# Patient Record
Sex: Female | Born: 1995 | Race: White | Hispanic: No | Marital: Married | Smoking: Former smoker
Health system: Southern US, Community
[De-identification: ages and names within clinical notes are randomized; demographics above are authoritative.]

## PROBLEM LIST (undated history)

## (undated) DIAGNOSIS — G935 Compression of brain: Secondary | ICD-10-CM

## (undated) DIAGNOSIS — E063 Autoimmune thyroiditis: Secondary | ICD-10-CM

## (undated) DIAGNOSIS — E039 Hypothyroidism, unspecified: Secondary | ICD-10-CM

## (undated) DIAGNOSIS — R519 Headache, unspecified: Secondary | ICD-10-CM

## (undated) DIAGNOSIS — D6800 Von Willebrand disease, unspecified: Secondary | ICD-10-CM

## (undated) HISTORY — DX: Headache, unspecified: R51.9

## (undated) HISTORY — PX: WISDOM TOOTH EXTRACTION: SHX21

## (undated) HISTORY — DX: Compression of brain: G93.5

---
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
## 2014-01-06 ENCOUNTER — Emergency Department: Payer: Self-pay | Admitting: Emergency Medicine

## 2014-01-06 LAB — COMPREHENSIVE METABOLIC PANEL
ALBUMIN: 4.4 g/dL (ref 3.8–5.6)
Alkaline Phosphatase: 70 U/L
Anion Gap: 5 — ABNORMAL LOW (ref 7–16)
BUN: 14 mg/dL (ref 7–18)
Bilirubin,Total: 0.8 mg/dL (ref 0.2–1.0)
CALCIUM: 9.8 mg/dL (ref 9.0–10.7)
CHLORIDE: 102 mmol/L (ref 98–107)
Co2: 28 mmol/L (ref 21–32)
Creatinine: 0.88 mg/dL (ref 0.60–1.30)
GLUCOSE: 126 mg/dL — AB (ref 65–99)
Osmolality: 272 (ref 275–301)
Potassium: 4.3 mmol/L (ref 3.5–5.1)
SGOT(AST): 21 U/L (ref 0–26)
SGPT (ALT): 20 U/L
Sodium: 135 mmol/L — ABNORMAL LOW (ref 136–145)
TOTAL PROTEIN: 8.2 g/dL (ref 6.4–8.6)

## 2014-01-06 LAB — CBC WITH DIFFERENTIAL/PLATELET
Basophil #: 0 10*3/uL (ref 0.0–0.1)
Basophil %: 0.4 %
Eosinophil #: 0.1 10*3/uL (ref 0.0–0.7)
Eosinophil %: 0.9 %
HCT: 41.7 % (ref 35.0–47.0)
HGB: 13.5 g/dL (ref 12.0–16.0)
Lymphocyte #: 0.9 10*3/uL — ABNORMAL LOW (ref 1.0–3.6)
Lymphocyte %: 8.2 %
MCH: 31.3 pg (ref 26.0–34.0)
MCHC: 32.4 g/dL (ref 32.0–36.0)
MCV: 97 fL (ref 80–100)
MONO ABS: 0.8 x10 3/mm (ref 0.2–0.9)
Monocyte %: 7 %
Neutrophil #: 9.6 10*3/uL — ABNORMAL HIGH (ref 1.4–6.5)
Neutrophil %: 83.5 %
PLATELETS: 274 10*3/uL (ref 150–440)
RBC: 4.32 10*6/uL (ref 3.80–5.20)
RDW: 12.5 % (ref 11.5–14.5)
WBC: 11.5 10*3/uL — AB (ref 3.6–11.0)

## 2014-01-06 LAB — URINALYSIS, COMPLETE
BACTERIA: NONE SEEN
BILIRUBIN, UR: NEGATIVE
GLUCOSE, UR: NEGATIVE mg/dL (ref 0–75)
Ketone: NEGATIVE
Nitrite: NEGATIVE
PH: 8 (ref 4.5–8.0)
PROTEIN: NEGATIVE
Specific Gravity: 1.015 (ref 1.003–1.030)

## 2014-01-06 LAB — LIPASE, BLOOD: Lipase: 130 U/L (ref 73–393)

## 2014-01-08 LAB — URINE CULTURE

## 2014-06-15 NOTE — H&P (Signed)
L&D Evaluation:  History:   HPI 19 year old G1P0 presents to L&D at 26 weeks with c/o cramping off and on for 2 days but worsening within the last 6 hours. EDD 12/14/11 based off of 12 5/7 weeks [facility] with no significant events so far.  Pt did say she also had some spotting when she wiped late last night 8/4 and had intercourse "a few days ago". Pains mainly down low in abd and "crampy" but also have some mild pain in her back (both sides but more on left if she had to pick a side).    Presents with abdominal pain    Patient's Medical History No Chronic Illness    Patient's Surgical History none    Medications Pre Natal Vitamins    Allergies NKDA    Social History none    Family History Non-Contributory   ROS:   ROS All systems were reviewed.  HEENT, CNS, GI, GU, Respiratory, CV, Renal and Musculoskeletal systems were found to be normal., see HPI   Exam:   Vital Signs stable    Urine Protein negative dipstick    General no apparent distress    Mental Status clear    Abdomen gravid, non-tender    Estimated Fetal Weight Average for gestational age    Back no CVAT    Edema no edema    Pelvic no external lesions, cervix closed and thick    Mebranes Intact    FHT normal rate with no decels, appropriate for 26 week fetus    Ucx absent, some irritability noted    Skin dry   Impression:   Impression normal discomforts of pregnancy/uterine irritability   Plan:   Plan UA, EFM/NST, monitor contractions and for cervical change, discharge    Comments UA done and mostly negative except for 3+ blood, trace bacteria, and amorphous crystal.  Cervix long and closed, no bleeding or old blood noted on glove upon exam. Blood in urine could be urinary bleeding (possible stone?) or vaginal bleeding (pt did say she had some spotting). Pt does not present as someone would would have stone and pain she is feeling is mainly lower abd and "crampy". Having some mild back  pain but appears to be normal discomforts of labor. Preterm labor prec given, pelvic rest, to keep follow up in appt in 2 weeks.   Electronic Signatures: Shella Maximutnam, Rhys Anchondo (CNM)  (Signed 05-Aug-13 03:11)  Authored: L&D Evaluation   Last Updated: 05-Aug-13 03:11 by Shella MaximPutnam, Myiesha Edgar (CNM)

## 2014-06-15 NOTE — H&P (Signed)
L&D Evaluation:  History Expanded:   HPI 19 yo at 3317w2d presenting with contraction since yesterday, was seen in L&D last night and d/c home with cervix closed & long. Pt returned this am and was 3 cm per RN. No LOF, no VB, +FM.    Presents with contractions    Patient's Medical History ADD    Patient's Surgical History none    Medications Pre Natal Vitamins    Allergies NKDA    Social History tobacco  drugs  cannabis - none currently    Family History Non-Contributory   Exam:   Vital Signs stable    Urine Protein not completed    General appears uncomfortable with contractions    Mental Status clear    Abdomen gravid, tender with contractions    Estimated Fetal Weight Average for gestational age, vtx    Edema no edema    Pelvic 3 cm per RN    Mebranes Intact    FHT normal rate with no decels    Ucx regular    Skin dry   Impression:   Impression active labor, early labor   Plan:   Plan EFM/NST, monitor contractions and for cervical change    Comments IV stadol Epidural when labs returned Anticipate vaginal delivery   Electronic Signatures: Vella KohlerBrothers, Antonios Ostrow K (CNM)  (Signed 21-Oct-13 11:57)  Authored: L&D Evaluation   Last Updated: 21-Oct-13 11:57 by Vella KohlerBrothers, Madelon Welsch K (CNM)

## 2014-06-15 NOTE — H&P (Signed)
L&D Evaluation:  History:   HPI 19 yo at 4252w2d presenting with contraction all day increasing in intensity and frequency.  No LOF, no VB, +FM.    Presents with contractions    Patient's Medical History ADD    Patient's Surgical History none    Medications Pre Natal Vitamins    Allergies NKDA    Social History tobacco  drugs  cannabis    Family History Non-Contributory   Exam:   Vital Signs stable    Urine Protein not completed    General appear uncomfortable with contractions    Mental Status clear    Abdomen gravid, non-tender    Estimated Fetal Weight Average for gestational age, vtx    Edema no edema    Pelvic FTP/Thick per nrusing staff unchanged on 2-hr recheck    Mebranes Intact    FHT normal rate with no decels, reactive tracing (negative contraction stress test)    Ucx irregular   Impression:   Impression Braxton Hicks contraction at term   Plan:   Plan EFM/NST, monitor contractions and for cervical change    Comments - reassurance - given ambien prior to discharge - routine labor precuations - Follow up 11/27/11   Electronic Signatures: Lorrene ReidStaebler, Nataleigh Griffin M (MD)  (Signed 20-Oct-13 22:43)  Authored: L&D Evaluation   Last Updated: 20-Oct-13 22:43 by Lorrene ReidStaebler, Maddyson Keil M (MD)

## 2014-09-08 ENCOUNTER — Inpatient Hospital Stay: Payer: Self-pay | Admitting: Internal Medicine

## 2014-09-08 ENCOUNTER — Inpatient Hospital Stay: Payer: Self-pay | Attending: Internal Medicine

## 2014-11-07 ENCOUNTER — Telehealth: Payer: Self-pay | Admitting: *Deleted

## 2014-11-07 NOTE — Telephone Encounter (Signed)
i had entered a no show r/s in basket message on 8/11 to r/s pt from her no show on 8/3 for labs and see md.  Misty Stanley the scheduler had sent me a message on 8/11 stating that she called pt and pt would call herself if she wants to r/s another appt.  I wanted this to be in her chart for future that it will not show [place] from inbasket in system

## 2015-04-26 IMAGING — CT CT CHEST-ABD W/ CM
1 of 3 series · 13 of 32 positions shown, 19 images · IV contrast (APPLIED)
Comparison: none

REASON FOR EXAM: (1) pleuritic cp w/ some sob and hi ddimer; (2) ruq pain
and cva pain NO PO CONT
COMMENTS:

PROCEDURE:     CT  - CT CHEST AND ABDOMEN W  - October 29, 2012 [DATE]
RESULT:     History: Chest pain.
Comparison Study: Chest x-ray 03/30/2011.

[Series 9: soft tissue · axial · 0.56mm/px · z∈[-982,-766]mm · 13 of 85 slices shown, 19 images]
[im 7/85  soft-tissue]
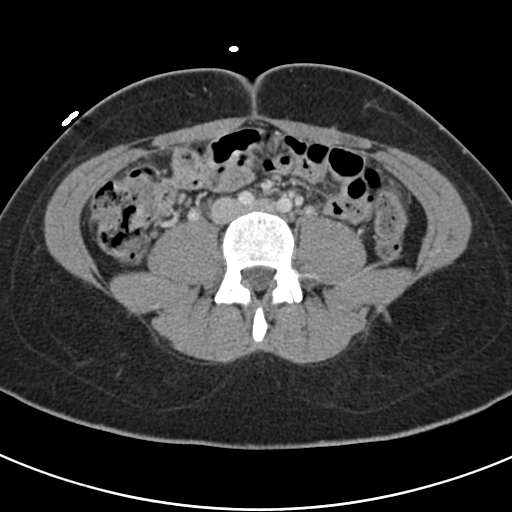
[im 7/85  bone]
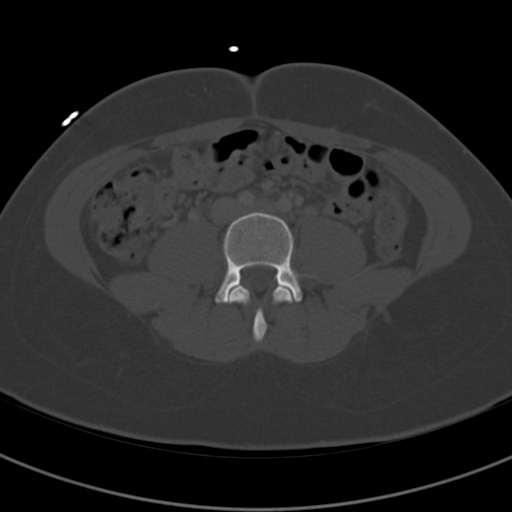
[im 13/85  soft-tissue]
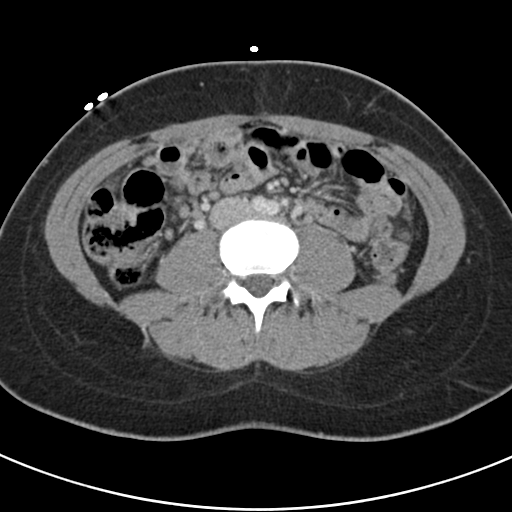
[im 19/85  soft-tissue]
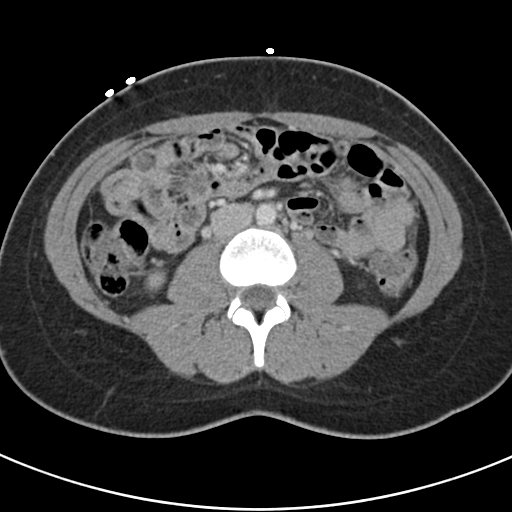
[im 25/85  soft-tissue]
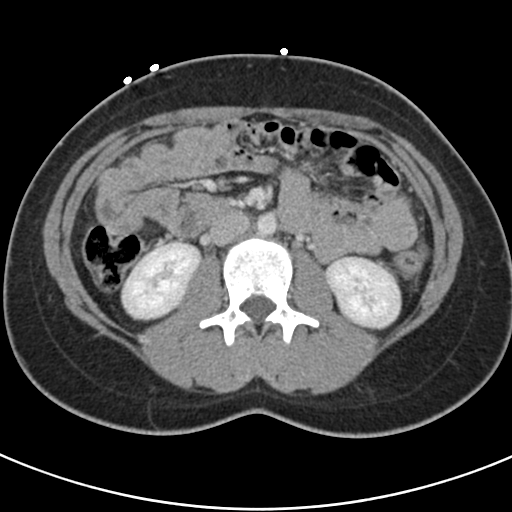
[im 31/85  soft-tissue]
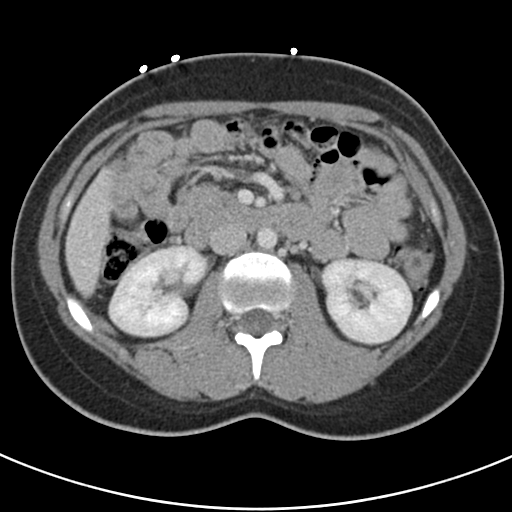
[im 37/85  soft-tissue]
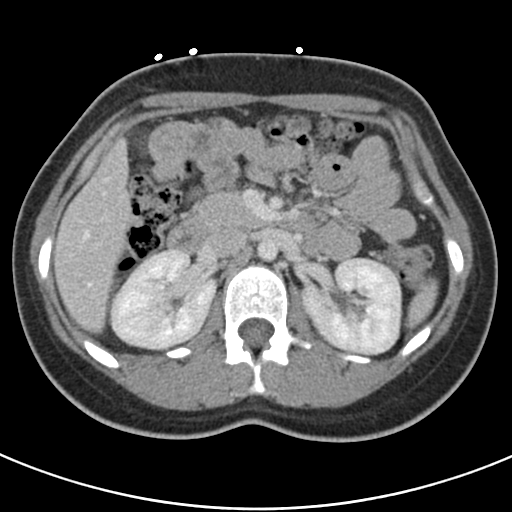
[im 43/85  soft-tissue]
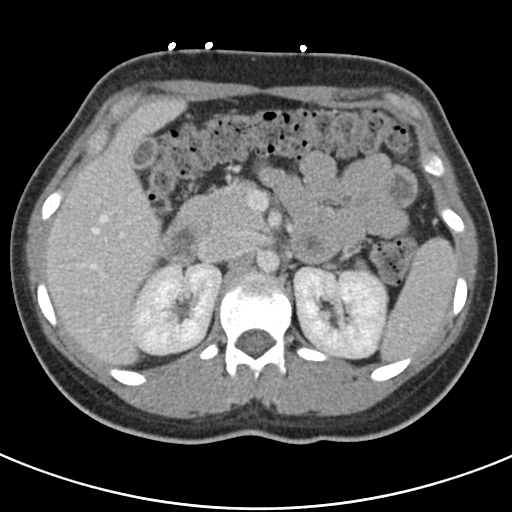
[im 49/85  soft-tissue]
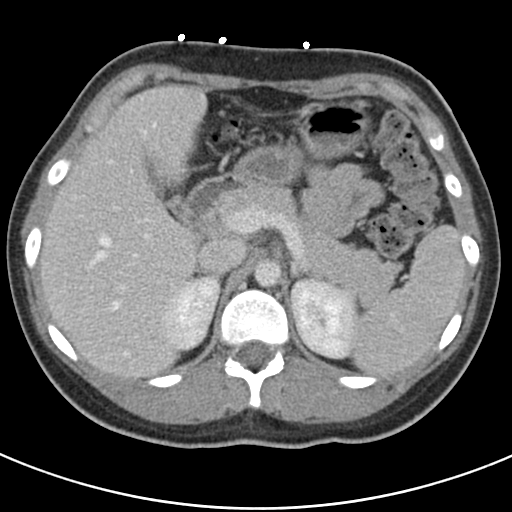
[im 55/85  soft-tissue]
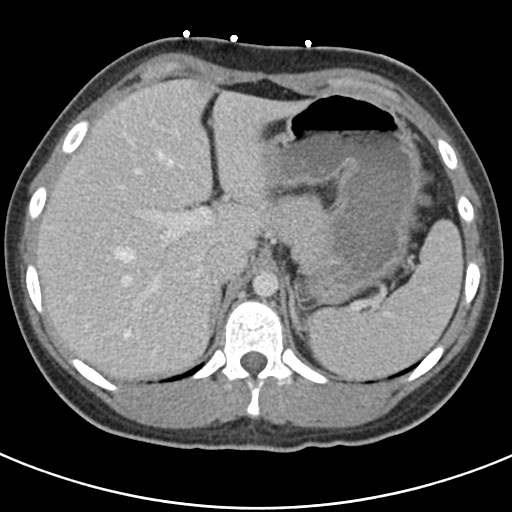
[im 55/85  bone]
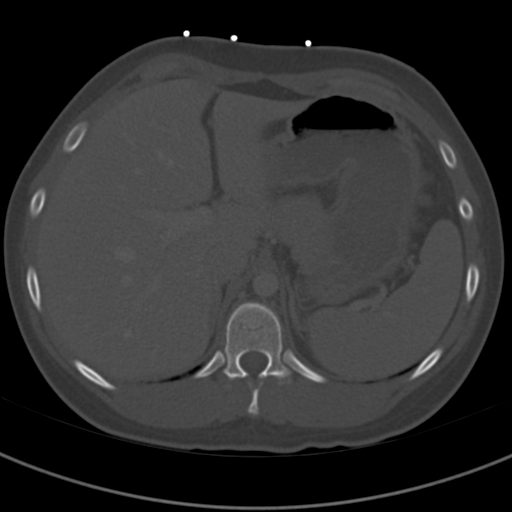
[im 61/85  soft-tissue]
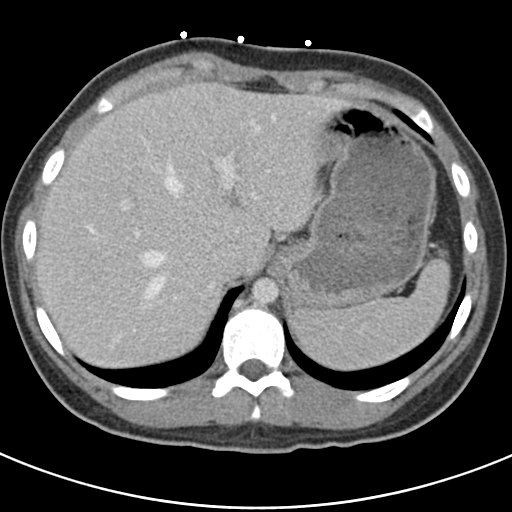
[im 61/85  lung]
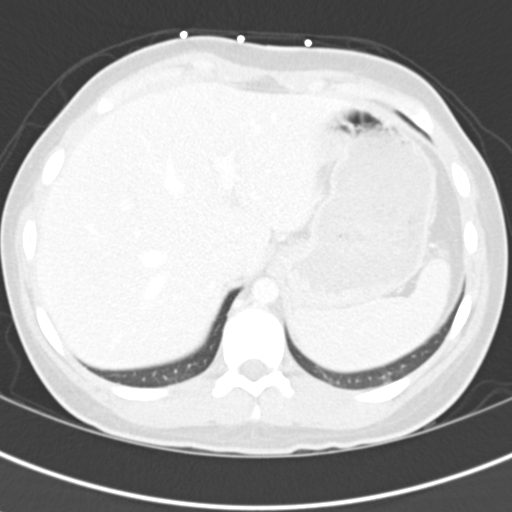
[im 67/85  soft-tissue]
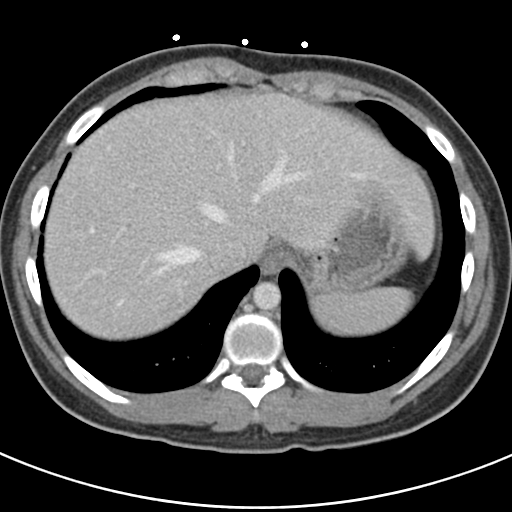
[im 67/85  lung]
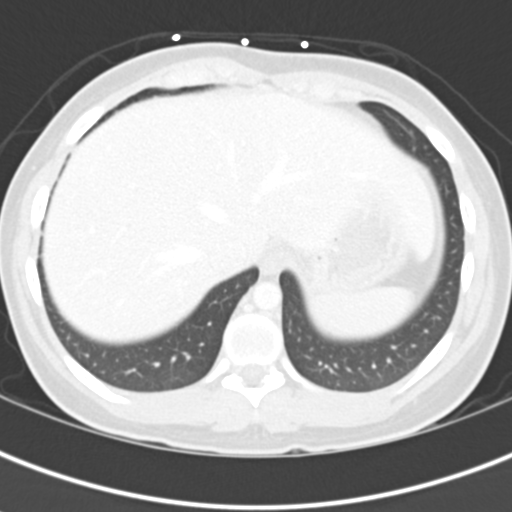
[im 73/85  soft-tissue]
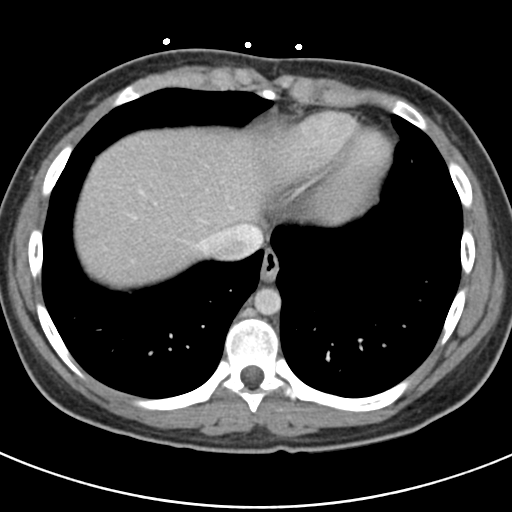
[im 73/85  lung]
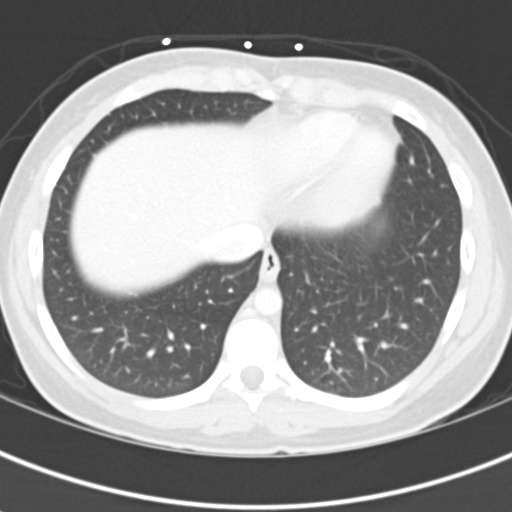
[im 79/85  soft-tissue]
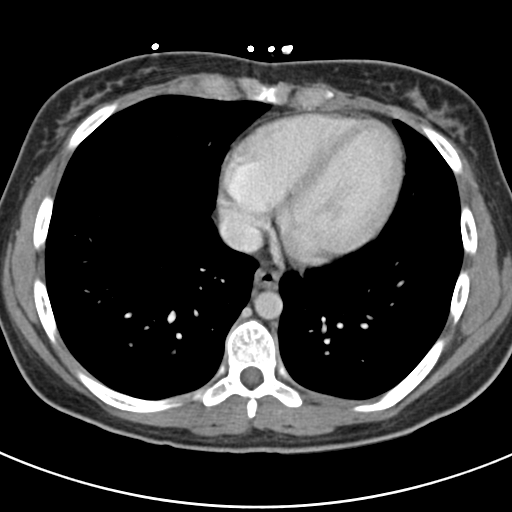
[im 79/85  lung]
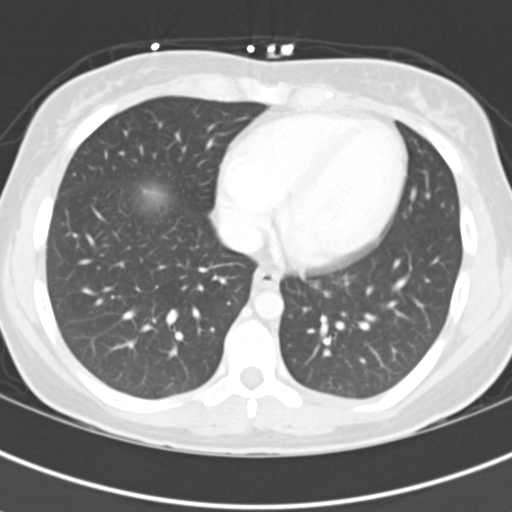

[13 of 32 positions shown; findings below may reference images not displayed]

FINDINGS: Standard CT obtained with 100 cc of Ysovue-MUL. Evaluation in 3
dimensions on separate workstation performed. Thoracic aorta unremarkable.
Pulmonary arteries are normal. Heart size normal. Large airways are patent.
Lungs are clear of infiltrates. Soft tissue fullness is noted in the
anterior mediastinum. This may represent represent residual thymus. Anterior
mediastinal mass cannot be excluded. PET CT should be considered for further
evaluation.

Liver normal. Spleen normal. Pancreas normal. No biliary distention.
Gallbladder nondistended.

Adrenals normal. No significant renal abnormality. No hydronephrosis.

Abdominal aorta normal. No significant adenopathy.

No bowel distention. No free air. Esophagus unremarkable.

No acute bony abnormality.
IMPRESSION: Anterior mediastinal mass. Although this may represent
residual thymus an anterior mediastinal tumor cannot be excluded. PET CT
suggest for further evaluation.

## 2015-05-18 ENCOUNTER — Encounter: Payer: Self-pay | Admitting: *Deleted

## 2015-05-18 ENCOUNTER — Emergency Department
Admission: EM | Admit: 2015-05-18 | Discharge: 2015-05-18 | Disposition: A | Discharge status: Home / Self Care | Payer: BLUE CROSS/BLUE SHIELD | Attending: Student | Admitting: Student

## 2015-05-18 ENCOUNTER — Emergency Department: Payer: BLUE CROSS/BLUE SHIELD

## 2015-05-18 DIAGNOSIS — M79601 Pain in right arm: Secondary | ICD-10-CM | POA: Diagnosis present

## 2015-05-18 DIAGNOSIS — S56911A Strain of unspecified muscles, fascia and tendons at forearm level, right arm, initial encounter: Secondary | ICD-10-CM | POA: Diagnosis not present

## 2015-05-18 DIAGNOSIS — Y990 Civilian activity done for income or pay: Secondary | ICD-10-CM | POA: Diagnosis not present

## 2015-05-18 DIAGNOSIS — Y9389 Activity, other specified: Secondary | ICD-10-CM | POA: Diagnosis not present

## 2015-05-18 DIAGNOSIS — Y929 Unspecified place or not applicable: Secondary | ICD-10-CM | POA: Insufficient documentation

## 2015-05-18 DIAGNOSIS — X509XXA Other and unspecified overexertion or strenuous movements or postures, initial encounter: Secondary | ICD-10-CM | POA: Insufficient documentation

## 2015-05-18 DIAGNOSIS — S53401A Unspecified sprain of right elbow, initial encounter: Secondary | ICD-10-CM | POA: Insufficient documentation

## 2015-05-18 MED ORDER — HYDROCODONE-ACETAMINOPHEN 5-325 MG PO TABS
1.0000 | ORAL_TABLET | Freq: Once | ORAL | Status: AC
  Administered 2015-05-18: 1 via ORAL
  Filled 2015-05-18: qty 1

## 2015-05-18 MED ORDER — IBUPROFEN 600 MG PO TABS
600.0000 mg | ORAL_TABLET | Freq: Once | ORAL | Status: AC
  Administered 2015-05-18: 600 mg via ORAL
  Filled 2015-05-18: qty 1

## 2015-05-18 MED ORDER — IBUPROFEN 600 MG PO TABS: 600.0000 mg | ORAL_TABLET | Freq: Three times a day (TID) | ORAL | Status: DC | PRN

## 2015-05-18 MED ORDER — OXYCODONE-ACETAMINOPHEN 5-325 MG PO TABS: 1.0000 | ORAL_TABLET | ORAL | Status: DC | PRN

## 2015-05-18 NOTE — Discharge Instructions (Signed)
Wear sling for comfort. Continue using ice as needed. Percocet as needed for severe pain and ibuprofen 600 mg 3 times a day with food. Make an appointment with Dr. Hyacinth MeekerMiller for follow-up of your right elbow pain.

## 2015-05-18 NOTE — ED Notes (Signed)
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

## 2015-05-18 NOTE — ED Provider Notes (Signed)
[facility] Emergency Department Provider Note  ____________________________________________  Time seen: Approximately 1:11 PM  I have reviewed the triage vital signs and the nursing notes.   HISTORY  Chief Complaint Arm Pain   HPI Nicole Freeman is a 20 y.o. female is here with complaint of right elbow pain. 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 Patient denies any injury or trauma to her elbow and is unsure whether this is going to be covered under workmen's comp. Patient is not taking any over-the-counter medication prior to arrival in the emergency room. She is still able to move her fingers and grip however she reports that this also causes pain. She also did denies any previous injury to her elbow. Currently she rates her pain is 7 out of 10.  History reviewed. No pertinent past medical history.  There are no active problems to display for this patient.   No past surgical history on file.  Current Outpatient Rx  Name  Route  Sig  Dispense  Refill  . ibuprofen (ADVIL,MOTRIN) 600 MG tablet   Oral   Take 1 tablet (600 mg total) by mouth every 8 (eight) hours as needed.   30 tablet   0   . oxyCODONE-acetaminophen (PERCOCET) 5-325 MG tablet   Oral   Take 1 tablet by mouth every 4 (four) hours as needed for severe pain.   20 tablet   0     Allergies Review of patient's allergies indicates no known allergies.  History reviewed. No pertinent family history.  Social History Social History  Substance Use Topics  . Smoking status: None  . Smokeless tobacco: None  . Alcohol Use: None    Review of Systems Constitutional: No fever/chills Cardiovascular: Denies chest pain. Respiratory: Denies shortness of breath. Musculoskeletal: Positive for right elbow pain. Skin: Negative for rash. Neurological: Negative for headaches, focal weakness or numbness.  10-point ROS otherwise  negative.  ____________________________________________   PHYSICAL EXAM:  VITAL SIGNS: ED Triage Vitals  Enc Vitals Group     BP 05/18/15 1238 110/87 mmHg     Pulse Rate 05/18/15 1238 94     Resp 05/18/15 1238 18     Temp 05/18/15 1238 98.2 F (36.8 C)     Temp Source 05/18/15 1238 Oral     SpO2 05/18/15 1238 100 %     Weight 05/18/15 1238 138 lb (62.596 kg)     Height 05/18/15 1238  (1.575 m)     Head Cir --      Peak Flow --      Pain Score 05/18/15 1236 7     Pain Loc --      Pain Edu? --      Excl. in GC? --     Constitutional: Alert and oriented. Well appearing and in no acute distress.Tearful. Eyes: Conjunctivae are normal. PERRL. EOMI. Head: Atraumatic. Nose: No congestion/rhinnorhea. Neck: No stridor.   Cardiovascular: Normal rate, regular rhythm. Grossly normal heart sounds.  Good peripheral circulation. Respiratory: Normal respiratory effort.  No retractions. Lungs CTAB. Musculoskeletal: Right elbow exam there is no gross deformity. There is no ecchymosis or abrasions present. There is no soft tissue swelling present. There is marked tenderness on palpation. Range of motion is greatly restricted secondary to patient's pain. Patient is unable to extend or rotate her elbow with out pain. Neurologic:  Normal speech and language. No gross focal neurologic deficits are appreciated. No  gait instability. Skin:  Skin is warm, dry and intact. No ecchymosis, abrasions, or erythema present. Psychiatric: Mood and affect are normal. Speech and behavior are normal.  ____________________________________________   LABS (all labs ordered are listed, but only abnormal results are displayed)  Labs Reviewed - No data to display   RADIOLOGY  Right elbow per radiologist is negative for fracture dislocation. ____________________________________________   PROCEDURES  Procedure(s) performed: None  Critical Care performed:  No  ____________________________________________   INITIAL IMPRESSION / ASSESSMENT AND PLAN / ED COURSE  Pertinent labs & imaging results that were available during my care of the patient were reviewed by me and considered in my medical decision making (see chart for details).  Patient was given ibuprofen and Percocet while in the emergency room for pain control. She is also given ice packs to put her elbow on. There was no continued swelling. Patient was placed in a sling. She is also given a prescription at discharge for Percocet and ibuprofen. She is to follow-up with Dr. Hyacinth MeekerMiller if any continued problems. She is given a note to remain out of work the next 2 days. ____________________________________________   FINAL CLINICAL IMPRESSION(S) / ED DIAGNOSES  Final diagnoses:  Strain of right elbow and forearm, initial encounter      Tommi RumpsRhonda L Summers, PA-C 05/18/15 1503  Gayla DossEryka A Gayle, MD 05/18/15 [phone]

## 2015-08-23 ENCOUNTER — Emergency Department (HOSPITAL_COMMUNITY)
Admission: EM | Admit: 2015-08-23 | Discharge: 2015-08-23 | Disposition: A | Discharge status: Home / Self Care | Payer: Self-pay | Attending: Emergency Medicine | Admitting: Emergency Medicine

## 2015-08-23 ENCOUNTER — Encounter (HOSPITAL_COMMUNITY): Payer: Self-pay

## 2015-08-23 DIAGNOSIS — J029 Acute pharyngitis, unspecified: Secondary | ICD-10-CM | POA: Insufficient documentation

## 2015-08-23 DIAGNOSIS — F17210 Nicotine dependence, cigarettes, uncomplicated: Secondary | ICD-10-CM | POA: Insufficient documentation

## 2015-08-23 DIAGNOSIS — Z791 Long term (current) use of non-steroidal anti-inflammatories (NSAID): Secondary | ICD-10-CM | POA: Insufficient documentation

## 2015-08-23 DIAGNOSIS — Z79899 Other long term (current) drug therapy: Secondary | ICD-10-CM | POA: Insufficient documentation

## 2015-08-23 HISTORY — DX: Von Willebrand disease, unspecified: D68.00

## 2015-08-23 HISTORY — DX: Von Willebrand's disease: D68.0

## 2015-08-23 LAB — RAPID STREP SCREEN (MED CTR MEBANE ONLY): STREPTOCOCCUS, GROUP A SCREEN (DIRECT): NEGATIVE

## 2015-08-23 MED ORDER — IBUPROFEN 600 MG PO TABS: 600.0000 mg | ORAL_TABLET | Freq: Four times a day (QID) | ORAL | Status: DC | PRN

## 2015-08-23 NOTE — Discharge Instructions (Signed)
Pharyngitis Pharyngitis is redness, pain, and swelling (inflammation) of your pharynx.  CAUSES  Pharyngitis is usually caused by infection. Most of the time, these infections are from viruses (viral) and are part of a cold. However, sometimes pharyngitis is caused by bacteria (bacterial). Pharyngitis can also be caused by allergies. Viral pharyngitis may be spread from person to person by coughing, sneezing, and personal items or utensils (cups, forks, spoons, toothbrushes). Bacterial pharyngitis may be spread from person to person by more intimate contact, such as kissing.  SIGNS AND SYMPTOMS  Symptoms of pharyngitis include:   Sore throat.   Tiredness (fatigue).   Low-grade fever.   Headache.  Joint pain and muscle aches.  Skin rashes.  Swollen lymph nodes.  Plaque-like film on throat or tonsils (often seen with bacterial pharyngitis). DIAGNOSIS  Your health care provider will ask you questions about your illness and your symptoms. Your medical history, along with a physical exam, is often all that is needed to diagnose pharyngitis. Sometimes, a rapid strep test is done. Other lab tests may also be done, depending on the suspected cause.  TREATMENT  Viral pharyngitis will usually get better in 3-4 days without the use of medicine. Bacterial pharyngitis is treated with medicines that kill germs (antibiotics).  HOME CARE INSTRUCTIONS   Drink enough water and fluids to keep your urine clear or pale yellow.   Only take over-the-counter or prescription medicines as directed by your health care provider:   If you are prescribed antibiotics, make sure you finish them even if you start to feel better.   Do not take aspirin.   Get lots of rest.   Gargle with 8 oz of salt water ( tsp of salt per 1 qt of water) as often as every 1-2 hours to soothe your throat.   Throat lozenges (if you are not at risk for choking) or sprays may be used to soothe your throat. SEEK MEDICAL  CARE IF:   You have large, tender lumps in your neck.  You have a rash.  You cough up green, yellow-brown, or bloody spit. SEEK IMMEDIATE MEDICAL CARE IF:   Your neck becomes stiff.  You drool or are unable to swallow liquids.  You vomit or are unable to keep medicines or liquids down.  You have severe pain that does not go away with the use of recommended medicines.  You have trouble breathing (not caused by a stuffy nose). MAKE SURE YOU:   Understand these instructions.  Will watch your condition.  Will get help right away if you are not doing well or get worse.   This information is not intended to replace advice given to you by your health care provider. Make sure you discuss any questions you have with your health care provider.   Document Released: 01/22/2005 Document Revised: 11/12/2012 Document Reviewed: 09/29/2012 Elsevier Interactive Patient Education [employer]2016 [employer]   Your strep test today is negative, I suspect your symptoms are from a viral infection which will resolve without need of antibiotics.  Continue using ibuprofen to help with throat pain.  Warm salt gargles may be helpful.

## 2015-08-23 NOTE — ED Provider Notes (Signed)
CSN: [id number]     Arrival date & time 08/23/15  1132 History   First MD Initiated Contact with Patient 08/23/15 1142     Chief Complaint  Patient presents with  . Sore Throat     (Consider location/radiation/quality/duration/timing/severity/associated sxs/prior Treatment) The history is provided by the patient.   Nicole Freeman is a 20 y.o. female presenting with the two-week history of intermittent left ear pain without drainage, swelling or decreased hearing acuity.  Over the past 2 days in addition to the symptoms she also has sore throat and nasal congestion with clear rhinorrhea.  She has had no fevers or chills, denies cough, shortness of breath or other complaints.  She endorses her son had similar symptoms 2 weeks ago, was seen by his pediatrician and his strep test was negative but he was treated with antibiotics regardless.  She has found no alleviators for her symptoms.    Past Medical History  Diagnosis Date  . Von Willebrand disease (HCC)    History reviewed. No pertinent past surgical history. History reviewed. No pertinent family history. Social History  Substance Use Topics  . Smoking status: Current Every Day Smoker -- 0.50 packs/day  . Smokeless tobacco: None  . Alcohol Use: None   OB History    No data available     Review of Systems  Constitutional: Negative for fever and chills.  HENT: Positive for congestion, ear pain, rhinorrhea and sore throat. Negative for sinus pressure, trouble swallowing and voice change.   Eyes: Negative for discharge.  Respiratory: Negative for cough, shortness of breath, wheezing and stridor.   Cardiovascular: Negative for chest pain.  Gastrointestinal: Negative for abdominal pain.  Genitourinary: Negative.       Allergies  Review of patient's allergies indicates no known allergies.  Home Medications   Prior to Admission medications   Medication Sig Start Date End Date Taking? Authorizing Provider  ibuprofen  (ADVIL,MOTRIN) 600 MG tablet Take 1 tablet (600 mg total) by mouth every 6 (six) hours as needed. 08/23/15   Burgess AmorJulie Srihan Brutus, PA-C  oxyCODONE-acetaminophen (PERCOCET) 5-325 MG tablet Take 1 tablet by mouth every 4 (four) hours as needed for severe pain. Patient not taking: Reported on 08/23/2015 05/18/15   Tommi Rumpshonda L Summers, PA-C   BP 114/82 mmHg  Pulse 88  Temp(Src) 98.4 F (36.9 C) (Oral)  Resp 18  Ht 5\' 2"  (1.575 m)  Wt 62.596 kg  BMI 25.23 kg/m2  SpO2 100%  LMP 08/15/2015 (Exact Date) Physical Exam  Constitutional: She is oriented to person, place, and time. She appears well-developed and well-nourished.  HENT:  Head: Normocephalic and atraumatic.  Right Ear: Tympanic membrane and ear canal normal.  Left Ear: Tympanic membrane and ear canal normal.  Nose: No mucosal edema or rhinorrhea.  Mouth/Throat: Uvula is midline and mucous membranes are normal. Posterior oropharyngeal erythema present. No oropharyngeal exudate, posterior oropharyngeal edema or tonsillar abscesses.  Mild bilateral tonsillar erythema.  Tonsils equal bilaterally 1+.  Uvula midline.  Eyes: Conjunctivae are normal.  Cardiovascular: Normal rate and normal heart sounds.   Pulmonary/Chest: Effort normal. No respiratory distress. She has no wheezes. She has no rales.  Abdominal: Soft. There is no tenderness.  Musculoskeletal: Normal range of motion.  Lymphadenopathy:    She has no cervical adenopathy.  No head or neck adenopathy  Neurological: She is alert and oriented to person, place, and time.  Skin: Skin is warm and dry. No rash noted.  Psychiatric: She has a normal mood and  affect.    ED Course  Procedures (including critical care time) Labs Review Labs Reviewed  RAPID STREP SCREEN (NOT AT [facility])  CULTURE, GROUP A STREP [facility])    Imaging Review No results found. I have personally reviewed and evaluated these images and lab results as part of my medical decision-making.   EKG Interpretation None       MDM   Final diagnoses:  Viral pharyngitis    Patient advised ibuprofen, warm salt gargles.  Strep test reviewed and discussed with patients.  She is aware there is a culture pending.  Advised when necessary follow-up for any persistent or worsening symptoms.  The patient appears reasonably screened and/or stabilized for discharge and I doubt any other medical condition or other [facility] requiring further screening, evaluation, or treatment in the ED at this time prior to discharge.     Burgess Amor, PA-C 08/23/15 1445  Vanetta Mulders, MD 08/23/15 [phone]

## 2015-08-23 NOTE — ED Notes (Signed)
2 week hx of intermitt ear pain now the past 2 days left sore throat , some nasal cogestion

## 2015-08-26 LAB — CULTURE, GROUP A STREP (THRC)

## 2016-06-13 ENCOUNTER — Emergency Department (HOSPITAL_COMMUNITY): Payer: Self-pay

## 2016-06-13 ENCOUNTER — Encounter (HOSPITAL_COMMUNITY): Payer: Self-pay | Admitting: Emergency Medicine

## 2016-06-13 ENCOUNTER — Emergency Department (HOSPITAL_COMMUNITY)
Admission: EM | Admit: 2016-06-13 | Discharge: 2016-06-13 | Disposition: A | Discharge status: Home / Self Care | Payer: Self-pay | Attending: Emergency Medicine | Admitting: Emergency Medicine

## 2016-06-13 DIAGNOSIS — F17200 Nicotine dependence, unspecified, uncomplicated: Secondary | ICD-10-CM | POA: Insufficient documentation

## 2016-06-13 DIAGNOSIS — Y999 Unspecified external cause status: Secondary | ICD-10-CM | POA: Insufficient documentation

## 2016-06-13 DIAGNOSIS — Y939 Activity, unspecified: Secondary | ICD-10-CM | POA: Insufficient documentation

## 2016-06-13 DIAGNOSIS — M79641 Pain in right hand: Secondary | ICD-10-CM

## 2016-06-13 DIAGNOSIS — W010XXA Fall on same level from slipping, tripping and stumbling without subsequent striking against object, initial encounter: Secondary | ICD-10-CM | POA: Insufficient documentation

## 2016-06-13 DIAGNOSIS — S60221A Contusion of right hand, initial encounter: Secondary | ICD-10-CM | POA: Insufficient documentation

## 2016-06-13 DIAGNOSIS — Y929 Unspecified place or not applicable: Secondary | ICD-10-CM | POA: Insufficient documentation

## 2016-06-13 DIAGNOSIS — Z79899 Other long term (current) drug therapy: Secondary | ICD-10-CM | POA: Insufficient documentation

## 2016-06-13 MED ORDER — TRAMADOL HCL 50 MG PO TABS
100.0000 mg | ORAL_TABLET | Freq: Once | ORAL | Status: AC
  Administered 2016-06-13: 100 mg via ORAL
  Filled 2016-06-13: qty 2

## 2016-06-13 MED ORDER — PROMETHAZINE HCL 12.5 MG PO TABS
12.5000 mg | ORAL_TABLET | Freq: Once | ORAL | Status: AC
  Administered 2016-06-13: 12.5 mg via ORAL
  Filled 2016-06-13: qty 1

## 2016-06-13 MED ORDER — TRAMADOL HCL 50 MG PO TABS: ORAL_TABLET | ORAL | 0 refills | Status: DC

## 2016-06-13 NOTE — ED Triage Notes (Signed)
Pt c/o right hand pain after falling over dog.

## 2016-06-13 NOTE — ED Provider Notes (Signed)
[facility] DEPT Provider Note   CSN: [id number] Arrival date & time: 06/13/16  2005     History   Chief Complaint Chief Complaint  Patient presents with  . Hand Pain    HPI Nicole Freeman is a 21 y.o. female.  Patient is a 21 year old female who presents to the emergency department with a complaint of right hand pain.  The patient states that a proximally 6 PM today she fell over her dog and injured the right hand. Patient noted increased redness and swelling. Pain would not respond to conservative measures and the patient came to the emergency department for evaluation. It is also of note that the patient suffers from von Willebrand's disease. No previous operations or procedures involving the right hand. Nothing seems to help the pain. Touching the right hand or movement of the fingers of the right hand causes pain to be significantly worse.      Past Medical History:  Diagnosis Date  . Von Willebrand disease (HCC)     There are no active problems to display for this patient.   History reviewed. No pertinent surgical history.  OB History    No data available       Home Medications    Prior to Admission medications   Medication Sig Start Date End Date Taking? Authorizing Provider  ibuprofen (ADVIL,MOTRIN) 600 MG tablet Take 1 tablet (600 mg total) by mouth every 6 (six) hours as needed. 08/23/15   Burgess AmorIdol, Julie, PA-C  oxyCODONE-acetaminophen (PERCOCET) 5-325 MG tablet Take 1 tablet by mouth every 4 (four) hours as needed for severe pain. Patient not taking: Reported on 08/23/2015 05/18/15   Tommi RumpsSummers, Rhonda L, PA-C    Family History History reviewed. No pertinent family history.  Social History Social History  Substance Use Topics  . Smoking status: Current Every Day Smoker    Packs/day: 0.50  . Smokeless tobacco: Never Used  . Alcohol use Yes     Allergies   Patient has no known allergies.   Review of Systems Review of Systems  Musculoskeletal:       Hand pain  Hematological: Bruises/bleeds easily.  All other systems reviewed and are negative.    Physical Exam Updated Vital Signs BP 135/89   Pulse (!) 116   Temp 98.2 F (36.8 C)   Resp 18   Ht 5\' 3"  (1.6 m)   Wt 63.5 kg   LMP 05/23/2016   SpO2 100%   BMI 24.80 kg/m   Physical Exam  Constitutional: She is oriented to person, place, and time. She appears well-developed and well-nourished.  Non-toxic appearance.  HENT:  Head: Normocephalic.  Right Ear: Tympanic membrane and external ear normal.  Left Ear: Tympanic membrane and external ear normal.  There is no hematoma appreciated. There is no evidence of trauma to the tongue or oropharynx.  Eyes: EOM and lids are normal. Pupils are equal, round, and reactive to light.  Neck: Normal range of motion. Neck supple. Carotid bruit is not present.  Cardiovascular: Normal rate, regular rhythm, normal heart sounds, intact distal pulses and normal pulses.   Pulmonary/Chest: Breath sounds normal. No respiratory distress.  There is symmetrical rise and fall of the chest. There is no pain to palpation of the rib area.  Abdominal: Soft. Bowel sounds are normal. There is no tenderness. There is no guarding.  Musculoskeletal: Normal range of motion.       Right wrist: She exhibits normal range of motion, no tenderness and no bony tenderness.  Hands: Lymphadenopathy:       Head (right side): No submandibular adenopathy present.       Head (left side): No submandibular adenopathy present.    She has no cervical adenopathy.  Neurological: She is alert and oriented to person, place, and time. She has normal strength. No cranial nerve deficit or sensory deficit.  Skin: Skin is warm and dry.  Psychiatric: She has a normal mood and affect. Her speech is normal.  Nursing note and vitals reviewed.    ED Treatments / Results  Labs (all labs ordered are listed, but only abnormal results are displayed) Labs Reviewed - No data to  display  EKG  EKG Interpretation None       Radiology Dg Hand Complete Right  Result Date: 06/13/2016 CLINICAL DATA:  Acute onset of pain and swelling at the right fifth metacarpal after tripping over dog and landing on right hand. Initial encounter. EXAM: RIGHT HAND - COMPLETE 3+ VIEW COMPARISON:  None. FINDINGS: There is no evidence of fracture or dislocation. The joint spaces are preserved. The carpal rows are intact, and demonstrate normal alignment. Mild soft tissue swelling is noted about the fifth metacarpal. IMPRESSION: No evidence of fracture or dislocation. Electronically Signed   By: Roanna Raider M.D.   On: 06/13/2016 20:45    Procedures Procedures (including critical care time)  Medications Ordered in ED Medications  traMADol (ULTRAM) tablet 100 mg (not administered)  promethazine (PHENERGAN) tablet 12.5 mg (not administered)     Initial Impression / Assessment and Plan / ED Course  I have reviewed the triage vital signs and the nursing notes.  Pertinent labs & imaging results that were available during my care of the patient were reviewed by me and considered in my medical decision making (see chart for details).      Final Clinical Impressions(s) / ED Diagnoses MDM Vital signs reviewed. Pulse oximetry is 100% on room air.  The patient sustained a fall and injured the right hand. X-ray of the right hand is negative for fracture or dislocation. The patient has some swelling and some bruising present. Capillary refill is less than 2 seconds. The radial pulses are 2+.  Suspect contusion to the right hand. The patient will be treated with Ultram for severe pain. Tylenol for mild discomfort. I've provided an ice pack for the patient. We discussed the importance of keeping the hand elevated above the heart.    Final diagnoses:  Contusion of right hand, initial encounter  Right hand pain    New Prescriptions New Prescriptions   TRAMADOL (ULTRAM) 50 MG TABLET     1 or 2 po po q6h prn pain     Ivery Quale, PA-C 06/13/16 2123    Samuel Jester, DO 06/17/16 1543

## 2016-06-13 NOTE — Discharge Instructions (Addendum)
Your x-ray is negative for fracture or dislocation. You have some bruising and some mild swelling involving your right hand. Please apply ice, and please keep your hand elevated above your heart is much as possible. Use Tylenol Extra Strength every 4 hours for mild pain. Use Ultram every 6 hours for more severe pain. Ultram may cause drowsiness, please use this medication with caution. Please see her primary physician or return to the emergency department if any changes, problems, or concerns.

## 2016-08-10 ENCOUNTER — Encounter: Payer: Self-pay | Admitting: Emergency Medicine

## 2016-08-10 DIAGNOSIS — R1084 Generalized abdominal pain: Secondary | ICD-10-CM | POA: Insufficient documentation

## 2016-08-10 DIAGNOSIS — R7989 Other specified abnormal findings of blood chemistry: Secondary | ICD-10-CM | POA: Insufficient documentation

## 2016-08-10 DIAGNOSIS — J019 Acute sinusitis, unspecified: Secondary | ICD-10-CM | POA: Insufficient documentation

## 2016-08-10 DIAGNOSIS — F17200 Nicotine dependence, unspecified, uncomplicated: Secondary | ICD-10-CM | POA: Insufficient documentation

## 2016-08-10 DIAGNOSIS — R634 Abnormal weight loss: Secondary | ICD-10-CM | POA: Insufficient documentation

## 2016-08-10 DIAGNOSIS — R112 Nausea with vomiting, unspecified: Secondary | ICD-10-CM | POA: Insufficient documentation

## 2016-08-10 LAB — COMPREHENSIVE METABOLIC PANEL
ALBUMIN: 4.1 g/dL (ref 3.5–5.0)
ALK PHOS: 271 U/L — AB (ref 38–126)
ALT: 266 U/L — AB (ref 14–54)
AST: 104 U/L — AB (ref 15–41)
Anion gap: 11 (ref 5–15)
BILIRUBIN TOTAL: 0.7 mg/dL (ref 0.3–1.2)
BUN: 18 mg/dL (ref 6–20)
CO2: 21 mmol/L — ABNORMAL LOW (ref 22–32)
CREATININE: 0.73 mg/dL (ref 0.44–1.00)
Calcium: 9.1 mg/dL (ref 8.9–10.3)
Chloride: 104 mmol/L (ref 101–111)
GFR calc Af Amer: >60 mL/min (ref >60)
GLUCOSE: 90 mg/dL (ref 65–99)
Potassium: 3.3 mmol/L — ABNORMAL LOW (ref 3.5–5.1)
Sodium: 136 mmol/L (ref 135–145)
TOTAL PROTEIN: 7.5 g/dL (ref 6.5–8.1)

## 2016-08-10 LAB — CBC
HCT: 38 % (ref 35.0–47.0)
Hemoglobin: 12.9 g/dL (ref 12.0–16.0)
MCH: 32.1 pg (ref 26.0–34.0)
MCHC: 34.1 g/dL (ref 32.0–36.0)
MCV: 94.3 fL (ref 80.0–100.0)
PLATELETS: 380 10*3/uL (ref 150–440)
RBC: 4.02 MIL/uL (ref 3.80–5.20)
RDW: 13.1 % (ref 11.5–14.5)
WBC: 12.1 10*3/uL — AB (ref 3.6–11.0)

## 2016-08-10 NOTE — ED Triage Notes (Signed)
Pt states "i'm pretty sure I have two parasites in me." pt states she has had months of abd pain and intermittent vomiting. Pt states "i have a white worm in my nose and when I threw up the other day I saw a blood fluke." pt appears in no acute distress.

## 2016-08-11 ENCOUNTER — Encounter: Payer: Self-pay | Admitting: Emergency Medicine

## 2016-08-11 ENCOUNTER — Emergency Department: Payer: Self-pay

## 2016-08-11 ENCOUNTER — Emergency Department
Admission: EM | Admit: 2016-08-11 | Discharge: 2016-08-11 | Disposition: A | Discharge status: Home / Self Care | Payer: Self-pay | Attending: Emergency Medicine | Admitting: Emergency Medicine

## 2016-08-11 DIAGNOSIS — R7989 Other specified abnormal findings of blood chemistry: Secondary | ICD-10-CM

## 2016-08-11 DIAGNOSIS — J329 Chronic sinusitis, unspecified: Secondary | ICD-10-CM | POA: Insufficient documentation

## 2016-08-11 DIAGNOSIS — R1084 Generalized abdominal pain: Secondary | ICD-10-CM

## 2016-08-11 DIAGNOSIS — J019 Acute sinusitis, unspecified: Secondary | ICD-10-CM

## 2016-08-11 DIAGNOSIS — F17200 Nicotine dependence, unspecified, uncomplicated: Secondary | ICD-10-CM | POA: Insufficient documentation

## 2016-08-11 DIAGNOSIS — R945 Abnormal results of liver function studies: Secondary | ICD-10-CM

## 2016-08-11 LAB — URINALYSIS, COMPLETE (UACMP) WITH MICROSCOPIC
Glucose, UA: NEGATIVE mg/dL
HGB URINE DIPSTICK: NEGATIVE
KETONES UR: 5 mg/dL — AB
LEUKOCYTES UA: NEGATIVE
Nitrite: NEGATIVE
PROTEIN: 30 mg/dL — AB
Specific Gravity, Urine: 1.033 — ABNORMAL HIGH (ref 1.005–1.030)
pH: 5 (ref 5.0–8.0)

## 2016-08-11 LAB — DIFFERENTIAL
BASOS PCT: 1 %
Basophils Absolute: 0.1 10*3/uL (ref 0–0.1)
EOS PCT: 12 %
Eosinophils Absolute: 1.5 10*3/uL — ABNORMAL HIGH (ref 0–0.7)
Lymphocytes Relative: 22 %
Lymphs Abs: 2.7 10*3/uL (ref 1.0–3.6)
MONO ABS: 1.1 10*3/uL — AB (ref 0.2–0.9)
Monocytes Relative: 9 %
NEUTROS ABS: 7 10*3/uL — AB (ref 1.4–6.5)
Neutrophils Relative %: 56 %

## 2016-08-11 LAB — POCT PREGNANCY, URINE: PREG TEST UR: NEGATIVE

## 2016-08-11 MED ORDER — OXYMETAZOLINE HCL 0.05 % NA SOLN
NASAL | Status: AC
  Filled 2016-08-11: qty 15

## 2016-08-11 MED ORDER — FLUTICASONE PROPIONATE 50 MCG/ACT NA SUSP: 1.0000 | Freq: Every day | NASAL | 2 refills | Status: DC

## 2016-08-11 MED ORDER — IBUPROFEN 600 MG PO TABS
600.0000 mg | ORAL_TABLET | Freq: Once | ORAL | Status: AC
  Administered 2016-08-11: 600 mg via ORAL
  Filled 2016-08-11: qty 1

## 2016-08-11 MED ORDER — IOPAMIDOL (ISOVUE-300) INJECTION 61%
75.0000 mL | Freq: Once | INTRAVENOUS | Status: AC | PRN
Start: 2016-08-11 — End: 2016-08-11
  Administered 2016-08-11: 75 mL via INTRAVENOUS

## 2016-08-11 MED ORDER — IOPAMIDOL (ISOVUE-300) INJECTION 61%
30.0000 mL | Freq: Once | INTRAVENOUS | Status: AC | PRN
  Administered 2016-08-11: 30 mL via ORAL

## 2016-08-11 NOTE — ED Notes (Signed)
Pt returned to ED Rm 6 from CT at this time. 

## 2016-08-11 NOTE — ED Provider Notes (Addendum)
[facility] Emergency Department Provider Note  Time seen: 5:13 PM  I have reviewed the triage vital signs and the nursing notes.   HISTORY  Chief Complaint Possible Worms    HPI Nicole Freeman is a 21 y.o. female presents to the emergency department for sinus congestion concerned that she could have a parasite living in her nose. According to the patient she states for the past one year she has had significant sinus congestion and difficulty sleeping at night due to sinus congestion. Patient states she thought she had some snotty on the outside of her nose near her eye but when she touched it is very stringy almost like a worm. Patient was concerned that this could be a parasite wanted to get evaluated for this. Patient was evaluated yesterday but states at that time she was having significant abdominal pain which was her main complaint and the sinus congestion was not adequately addressed. States her abdominal pain is much improved but she continued to have significant sinus congestion. Denies any fever. Denies any cough. Patient states the sinus congestion has been ongoing greater than 1 year.  Past Medical History:  Diagnosis Date  . Von Willebrand disease (HCC)     There are no active problems to display for this patient.   History reviewed. No pertinent surgical history.  Prior to Admission medications   Medication Sig Start Date End Date Taking? Authorizing Provider  ibuprofen (ADVIL,MOTRIN) 600 MG tablet Take 1 tablet (600 mg total) by mouth every 6 (six) hours as needed. 08/23/15   Burgess AmorIdol, Julie, PA-C  oxyCODONE-acetaminophen (PERCOCET) 5-325 MG tablet Take 1 tablet by mouth every 4 (four) hours as needed for severe pain. Patient not taking: Reported on 08/23/2015 05/18/15   Tommi RumpsSummers, Rhonda L, PA-C  traMADol Janean Sark(ULTRAM) 50 MG tablet 1 or 2 po po q6h prn pain 06/13/16   Ivery QualeBryant, Hobson, PA-C    No Known Allergies  History reviewed. No pertinent family  history.  Social History Social History  Substance Use Topics  . Smoking status: Current Every Day Smoker    Packs/day: 0.50  . Smokeless tobacco: Never Used  . Alcohol use Yes    Review of Systems Constitutional: Negative for fever. Eyes: Negative for visual changes. ENT: Positive for congestion times one year Cardiovascular: Negative for chest pain. Respiratory: Negative for shortness of breath. Negative for cough Gastrointestinal: Abdominal pain much improved. Negative for vomiting or diarrhea. Genitourinary: Negative for dysuria. Neurological: Negative for headache All other ROS negative  ____________________________________________   PHYSICAL EXAM:  VITAL SIGNS: ED Triage Vitals  Enc Vitals Group     BP 08/11/16 1601 124/84     Pulse Rate 08/11/16 1601 84     Resp 08/11/16 1601 18     Temp 08/11/16 1601 98.4 F (36.9 C)     Temp Source 08/11/16 1601 Oral     SpO2 08/11/16 1601 99 %     Weight 08/11/16 1559 120 lb (54.4 kg)     Height 08/11/16 1559 5\' 2"  (1.575 m)     Head Circumference --      Peak Flow --      Pain Score 08/11/16 1559 8     Pain Loc --      Pain Edu? --      Excl. in GC? --     Constitutional: Alert and oriented. Well appearing and in no distress. Eyes: Normal exam, No conjunctival injection. ENT   Head: Normocephalic and atraumatic.  Nose: Significant inflammation and congestion with exudates bilaterally on the nasal septum   Mouth/Throat: Mucous membranes are moist. Cardiovascular: Normal rate, regular rhythm. No murmur Respiratory: Normal respiratory effort without tachypnea nor retractions. Breath sounds are clear Gastrointestinal: Soft and nontender. No distention Musculoskeletal: Nontender with normal range of motion in all extremities.  Neurologic:  Normal speech and language. No gross focal neurologic deficits Skin:  Skin is warm, dry and intact.  Psychiatric: Mood and affect are  normal. ____________________________________________   INITIAL IMPRESSION / ASSESSMENT AND PLAN / ED COURSE  Pertinent labs & imaging results that were available during my care of the patient were reviewed by me and considered in my medical decision making (see chart for details).  The patient presents to the emergency department for nasal congestion concerned over possible parasite. The patient does appear to be a reasonable and rational person. She states she knows it is very unlikely that there is a parasite in her nose but she states she saw something that appeared stringy. On exam the patient does have significant sinusitis with exudates on her nasal septum. Patient states she is constantly blowing her nose and using things like Q-tips to wipe out her nose. I had a lengthy discussion with the patient regarding not blowing her nose, and never sticking any objects into her nose as this is likely causing trauma to the nasal turbinates and worsening her condition. There are no sign of any type of parasite on exam. Patient has a nontender abdomen as well. Denies seeing anything in her stool. Patient does however have a significant sinusitis which appears to be chronic as it has been ongoing greater than 1 year. I discussed with the patient that I do not believe antibiotics would be of much help however I do believe Flonase would be helpful for the patient as well as avoiding nasal trauma and following up with ENT. Patient is agreeable to this plan.  ____________________________________________   FINAL CLINICAL IMPRESSION(S) / ED DIAGNOSES  Sinusitis    Minna Antis, MD 08/11/16 1610    Minna Antis, MD 08/11/16 [phone]

## 2016-08-11 NOTE — ED Notes (Signed)
Mother at bedside with pt. Pt states her friend saw a worm in her nose yesterday and that today she saw a white spot on the side of her nose and when she touched it she saw a worm under the skin as it moved away. Her mother states pt also feels worms under her skin and that she (the mother) saw a worm crawling under the pts skin on her forearm pta.

## 2016-08-11 NOTE — Discharge Instructions (Signed)
You have been seen in the Emergency Department (ED) for abdominal pain.  Your evaluation did not identify a clear cause of your symptoms but was generally reassuring.  Other than a non-specific elevation of some of your lab tests, everything else was reassuring, including other blood tests, ultrasound, and CT scan of your abdomen/pelvis.  Please follow up as instructed above regarding today?s emergent visit and the symptoms that are bothering you.  Return to the ED if your abdominal pain worsens or fails to improve, you develop bloody vomiting, bloody diarrhea, you are unable to tolerate fluids due to vomiting, fever greater than 101, or other symptoms that concern you.

## 2016-08-11 NOTE — ED Notes (Signed)
Patient transported to Ultrasound at this time. 

## 2016-08-11 NOTE — ED Triage Notes (Signed)
Pt  States she is here for parasite sxs, states she was here last night for abdominal pain and worm that she found in her nose. Pt states today she found a worm squirming around her right eye. Pt states she feels worms crawling in her nose. Pt does have nasal congestion.  Pt states the worm moves back into her nose occasionally.

## 2016-08-11 NOTE — ED Provider Notes (Signed)
[facility] Emergency Department Provider Note  ____________________________________________   First MD Initiated Contact with Patient 08/11/16 0210     (approximate)  I have reviewed the triage vital signs and the nursing notes.   HISTORY  Chief Complaint Abdominal Pain    HPI Nicole Freeman is a 21 y.o. female With medical history as listed below who reports being generally healthy who presents for evaluation of a Friday of complaints.  She reports that she has been having abdominal pain intermittently for months but it has gotten worse.  She has intermittent nausea and vomiting as well.  She states that she has been losing weight without intentionally doing so recently.  Her sinuses have been bothering her a lot since the start of the summer which she first.  Allergies but they have been getting worse.  She states that she also had a white worm in her nose recently.  chest reports that sometimes she wakes up at various parts of her body hurt for a few hours, such as pain in her forearm a few days ago that was severe when she woke up and then completely resolved, pain in her left jaw when she woke up yesterday that completely resolved, etc.  She is also concerned that she has abdominal parasites because she states that the abdominal pain feels like something is rolling up flipping around while it is hurting.  Her son was recently diagnosed with pinworms so she wondered if that could be part of it.  She also states that when she vomited yesterday it was brown and she was concerned that it was blood.  She describes the pain as anywhere from mild to severe over the last few months.  Nothing in particular makes the patient's symptoms better nor worse.    She denies fever/chills, night sweats, chest pain, shortness of breath, dysuria, vaginal bleeding. She has not been camping or swimming in fresh water recently and is not been traveling outside of the country.  She has no  specific exposures that might lead to intestinal parasites.    Past Medical History:  Diagnosis Date  . Von Willebrand disease (HCC)     There are no active problems to display for this patient.   History reviewed. No pertinent surgical history.  Prior to Admission medications   Medication Sig Start Date End Date Taking? Authorizing Provider  ibuprofen (ADVIL,MOTRIN) 600 MG tablet Take 1 tablet (600 mg total) by mouth every 6 (six) hours as needed. 08/23/15   Burgess Amor, PA-C  oxyCODONE-acetaminophen (PERCOCET) 5-325 MG tablet Take 1 tablet by mouth every 4 (four) hours as needed for severe pain. Patient not taking: Reported on 08/23/2015 05/18/15   [name], PA-C  traMADol Janean Sark) 50 MG tablet 1 or 2 po po q6h prn pain 06/13/16   Ivery Quale, PA-C    Allergies Patient has no known allergies.  History reviewed. No pertinent family history.  Social History Social History  Substance Use Topics  . Smoking status: Current Every Day Smoker    Packs/day: 0.50  . Smokeless tobacco: Never Used  . Alcohol use Yes    Review of Systems Constitutional: No fever/chills.  Unintentional weight loss. Eyes: No visual changes. ENT: No sore throat.  Severe and persistent sinusitis over the last few weeks or months. Cardiovascular: Denies chest pain. Respiratory: Denies shortness of breath. Gastrointestinal: Intermittent abdominal pain for months with intermittent N/V.  Sensation of something kicking or "flipping" in her intestines.  Possible  hemetemesis yesterday. No diarrhea/constipation Genitourinary: Negative for dysuria. Musculoskeletal: Negative for neck pain.  Negative for back pain. Integumentary: Negative for rash. Neurological: Negative for headaches, focal weakness or numbness.   ____________________________________________   PHYSICAL EXAM:  VITAL SIGNS: ED Triage Vitals  Enc Vitals Group     BP 08/10/16 2211 (!) 142/87     Pulse Rate 08/10/16 2211 (!) 114       Resp 08/10/16 2211 16     Temp 08/10/16 2211 98.3 F (36.8 C)     Temp Source 08/10/16 2211 Oral     SpO2 08/10/16 2211 100 %     Weight 08/10/16 2211 54.4 kg (120 lb)     Height 08/10/16 2211 1.575 m (5\' 2" )     Head Circumference --      Peak Flow --      Pain Score 08/10/16 2210 2     Pain Loc --      Pain Edu? --      Excl. in GC? --     Constitutional: Alert and oriented. Well appearing and in no acute distress. Eyes: Conjunctivae are normal.  Head: Atraumatic. Nose: +congestion Mouth/Throat: Mucous membranes are moist. Neck: No stridor.  No meningeal signs.   Cardiovascular: Normal rate, regular rhythm. Good peripheral circulation. Grossly normal heart sounds. Respiratory: Normal respiratory effort.  No retractions. Lungs CTAB. Gastrointestinal: Soft with generalized, diffuse tenderness to palpation throughout abdomen.  No specific focal tenderness.  No rebound. Mild voluntary guarding. Genitourinary: deferred Musculoskeletal: No lower extremity tenderness nor edema. No gross deformities of extremities. Neurologic:  Normal speech and language. No gross focal neurologic deficits are appreciated.  Skin:  Skin is warm, dry and intact. No rash noted. Psychiatric: Mood and affect are normal. Speech and behavior are normal.  ____________________________________________   LABS (all labs ordered are listed, but only abnormal results are displayed)  Labs Reviewed  COMPREHENSIVE METABOLIC PANEL - Abnormal; Notable for the following:       Result Value   Potassium 3.3 (*)    CO2 21 (*)    AST 104 (*)    ALT 266 (*)    Alkaline Phosphatase 271 (*)    All other components within normal limits  CBC - Abnormal; Notable for the following:    WBC 12.1 (*)    All other components within normal limits  URINALYSIS, COMPLETE (UACMP) WITH MICROSCOPIC - Abnormal; Notable for the following:    Color, Urine AMBER (*)    APPearance HAZY (*)    Specific Gravity, Urine 1.033 (*)     Bilirubin Urine SMALL (*)    Ketones, ur 5 (*)    Protein, ur 30 (*)    Bacteria, UA RARE (*)    Squamous Epithelial / LPF 0-5 (*)    All other components within normal limits  DIFFERENTIAL - Abnormal; Notable for the following:    Neutro Abs 7.0 (*)    Monocytes Absolute 1.1 (*)    Eosinophils Absolute 1.5 (*)    All other components within normal limits  URINE CULTURE  POC URINE PREG, ED  POCT PREGNANCY, URINE   ____________________________________________  EKG  None - EKG not ordered by ED physician ____________________________________________  RADIOLOGY   Ct Abdomen Pelvis W Contrast  Result Date: 08/11/2016 CLINICAL DATA:  Acute onset of generalized abdominal pain and intermittent nausea. Elevated LFTs. EXAM: CT ABDOMEN AND PELVIS WITH CONTRAST TECHNIQUE: Multidetector CT imaging of the abdomen and pelvis was performed using the standard protocol following  bolus administration of intravenous contrast. CONTRAST:  75mL ISOVUE-300 IOPAMIDOL (ISOVUE-300) INJECTION 61% COMPARISON:  Right upper quadrant ultrasound performed earlier today at 3:01 a.m., and CT of the abdomen and pelvis performed 10/29/2012 FINDINGS: Lower chest: The visualized lung bases are grossly clear. The visualized portions of the mediastinum are unremarkable. Hepatobiliary: The liver is unremarkable in appearance. The gallbladder is unremarkable in appearance. The common bile duct remains normal in caliber. Pancreas: The pancreas is within normal limits. Spleen: The spleen is unremarkable in appearance. Adrenals/Urinary Tract: The adrenal glands are unremarkable in appearance. The kidneys are within normal limits. There is no evidence of hydronephrosis. No renal or ureteral stones are identified. No perinephric stranding is seen. Stomach/Bowel: The stomach is unremarkable in appearance. The small bowel is within normal limits. The appendix is normal in caliber, without evidence of appendicitis. The colon is unremarkable  in appearance. Vascular/Lymphatic: The abdominal aorta is unremarkable in appearance. The inferior vena cava is grossly unremarkable. No retroperitoneal lymphadenopathy is seen. No pelvic sidewall lymphadenopathy is identified. Reproductive: The bladder is mildly distended and grossly unremarkable. The uterus is unremarkable in appearance. The ovaries are relatively symmetric. No suspicious adnexal masses are seen. Other: No additional soft tissue abnormalities are seen. Musculoskeletal: No acute osseous abnormalities are identified. The visualized musculature is unremarkable in appearance. IMPRESSION: Unremarkable contrast-enhanced CT of the abdomen and pelvis. Electronically Signed   By: Roanna RaiderJeffery  Chang M.D.   On: 08/11/2016 05:47   [place] Abdomen Limited Ruq  Result Date: 08/11/2016 CLINICAL DATA:  Subacute onset of nausea, vomiting and generalized abdominal pain. Initial encounter. EXAM: ULTRASOUND ABDOMEN LIMITED RIGHT UPPER QUADRANT COMPARISON:  CT of the chest, abdomen and pelvis performed 10/29/2012 FINDINGS: Gallbladder: No gallstones or wall thickening visualized. No sonographic Murphy sign noted by sonographer. Common bile duct: Diameter: 0.2 cm, within normal limits in caliber. Liver: No focal lesion identified. Within normal limits in parenchymal echogenicity. IMPRESSION: Unremarkable ultrasound of the right upper quadrant. Electronically Signed   By: Roanna RaiderJeffery  Chang M.D.   On: 08/11/2016 03:31    ____________________________________________   PROCEDURES  Critical Care performed: No   Procedure(s)  performed:   Procedures   ____________________________________________   INITIAL IMPRESSION / ASSESSMENT AND PLAN / ED COURSE  Pertinent labs & imaging results that were available during my care of the patient were reviewed by me and considered in my medical decision making (see chart for details).  The patient has a broad constellation of symptoms with months of intermittent abdominal  pain and nausea and vomiting.  I added on a differential from her labs in triage and her eosinophil percentage is normal although the absolute eosinophil count is slightly elevated from the upper limit of normal.  Her urinalysis shows some red and white blood cells and I am sending a culture but she is not having any signs or symptoms of UTI so I will not treat empirically with antibiotic.  Her potassium is slightly low but her labs are most notable for a very mild leukocytosis of 12 and elevated liver function tests including AST, ALT, and alkaline phosphatase with a normal bilirubin.  She claims that she only rarely drinks alcohol and the significance of these results is uncertain at this time.  Given that it is primarily liver function tests that are abnormal, I think it is appropriate to start with an ultrasound of the right upper quadrant.  I explain to her that if we need to further evaluate with a CT We can do so but  we may have relevant results with the ultrasound and this would save her the radiation of the CT.  She understands and agrees with plan.   Clinical Course as of Aug 11 601  Sat Aug 11, 2016  0350 Unremarkable U/S.  Will proceed with CT scan for definitive evaluation of the abdomen/pelvis.  Updated patient. US Abdomen Limited RUQ [CF]  0556 Patient tolerating PO intake in the ED (she brought Cheetos and tolerated the contrast without difficulty).  CT scan unremarkable per radiology report.  Updated patient, advised close outpatient follow up with GI.  Provided contact information.    I gave my usual and customary return precautions.     [CF]  0600 Of note, when I went back in to check on the patient and give her the updates and results, she had just finished a meal of chicken fingers and several other foods.  [CF]    Clinical Course User Index [CF] Loleta Rose, MD    ____________________________________________  FINAL CLINICAL IMPRESSION(S) / ED DIAGNOSES  Final diagnoses:    Generalized abdominal pain  Elevated LFTs  Subacute sinusitis, unspecified location     MEDICATIONS GIVEN DURING THIS VISIT:  Medications  ibuprofen (ADVIL,MOTRIN) tablet 600 mg (600 mg Oral Given 08/11/16 0355)  iopamidol (ISOVUE-300) 61 % injection 75 mL (75 mLs Intravenous Contrast Given 08/11/16 0526)  iopamidol (ISOVUE-300) 61 % injection 30 mL (30 mLs Oral Contrast Given 08/11/16 0528)     NEW OUTPATIENT MEDICATIONS STARTED DURING THIS VISIT:  New Prescriptions   No medications on file    Modified Medications   No medications on file    Discontinued Medications   No medications on file     Note:  This document was prepared using Dragon voice recognition software and may include unintentional dictation errors.    Loleta Rose, MD 08/11/16 [phone]

## 2016-08-11 NOTE — ED Notes (Signed)
Lab notified of add-on Urine Culture to previously collected UA.

## 2016-08-11 NOTE — ED Notes (Signed)
Patient transported to CT at this time. 

## 2016-08-12 LAB — URINE CULTURE
CULTURE: NO GROWTH
SPECIAL REQUESTS: NORMAL

## 2017-02-05 NOTE — L&D Delivery Note (Signed)
Delivery Note Nicole Freeman is a 22 y.o. G2P1001 at 6211w5d admitted for SOL.  Labor course: augmented w/ AROM only. Pt has Von Willebrands, 'mild'. At 0731 a viable female was delivered via spontaneous vaginal delivery (Presentation: LOA), tight nuchal, delivered through, reduced immediately after birth.  Infant placed directly on mom's abdomen for bonding/skin-to-skin. Delayed cord clamping x 1min, then cord clamped x 2, and cut by FOB.  APGAR:9/9 ; weight: pending at time of note.  40 units of pitocin diluted in 1000cc LR was infused rapidly IV per protocol. The placenta separated spontaneously and delivered via CCT and maternal pushing effort.  It was inspected and appears to be intact with a 3 VC.  Placenta/Cord with the following complications: none .  Cord pH: not done  Intrapartum complications:  None Anesthesia:  epidural Episiotomy: none Lacerations:  Lt periurethral/labial, repaired for hemostasis Suture Repair: 3.0 vicryl Est. Blood Loss (mL): 217 Sponge and instrument count were correct x2.  Mom to postpartum.  Baby to Couplet care / Skin to Skin. Placenta to L&D. Plans to breastfeed Contraception: depo Circ: outpatient Wants pp visit & circ at FT  Cheral MarkerKimberly R Tarvares Lant CNM, [facility]NP-BC 01/08/2018 8:08 AM

## 2017-05-21 ENCOUNTER — Emergency Department (HOSPITAL_COMMUNITY)
Admission: EM | Admit: 2017-05-21 | Discharge: 2017-05-21 | Disposition: A | Discharge status: Home / Self Care | Payer: Medicaid Other | Attending: Emergency Medicine | Admitting: Emergency Medicine

## 2017-05-21 ENCOUNTER — Encounter (HOSPITAL_COMMUNITY): Payer: Self-pay | Admitting: Emergency Medicine

## 2017-05-21 DIAGNOSIS — Z3A01 Less than 8 weeks gestation of pregnancy: Secondary | ICD-10-CM | POA: Insufficient documentation

## 2017-05-21 DIAGNOSIS — O211 Hyperemesis gravidarum with metabolic disturbance: Secondary | ICD-10-CM | POA: Diagnosis not present

## 2017-05-21 DIAGNOSIS — E860 Dehydration: Secondary | ICD-10-CM

## 2017-05-21 DIAGNOSIS — O99331 Smoking (tobacco) complicating pregnancy, first trimester: Secondary | ICD-10-CM | POA: Diagnosis not present

## 2017-05-21 DIAGNOSIS — F17210 Nicotine dependence, cigarettes, uncomplicated: Secondary | ICD-10-CM | POA: Diagnosis not present

## 2017-05-21 DIAGNOSIS — O219 Vomiting of pregnancy, unspecified: Secondary | ICD-10-CM | POA: Diagnosis present

## 2017-05-21 DIAGNOSIS — R1110 Vomiting, unspecified: Secondary | ICD-10-CM

## 2017-05-21 DIAGNOSIS — Z79899 Other long term (current) drug therapy: Secondary | ICD-10-CM | POA: Diagnosis not present

## 2017-05-21 LAB — CBC
HCT: 38.6 % (ref 36.0–46.0)
HEMOGLOBIN: 12.9 g/dL (ref 12.0–15.0)
MCH: 30.4 pg (ref 26.0–34.0)
MCHC: 33.4 g/dL (ref 30.0–36.0)
MCV: 90.8 fL (ref 78.0–100.0)
Platelets: 288 10*3/uL (ref 150–400)
RBC: 4.25 MIL/uL (ref 3.87–5.11)
RDW: 11.9 % (ref 11.5–15.5)
WBC: 9 10*3/uL (ref 4.0–10.5)

## 2017-05-21 LAB — COMPREHENSIVE METABOLIC PANEL
ALK PHOS: 43 U/L (ref 38–126)
ALT: 14 U/L (ref 14–54)
ANION GAP: 11 (ref 5–15)
AST: 16 U/L (ref 15–41)
Albumin: 4.7 g/dL (ref 3.5–5.0)
BUN: 15 mg/dL (ref 6–20)
CALCIUM: 9.6 mg/dL (ref 8.9–10.3)
CO2: 20 mmol/L — AB (ref 22–32)
Chloride: 103 mmol/L (ref 101–111)
Creatinine, Ser: 0.68 mg/dL (ref 0.44–1.00)
GFR calc non Af Amer: >60 mL/min (ref >60)
Glucose, Bld: 87 mg/dL (ref 65–99)
Potassium: 3.7 mmol/L (ref 3.5–5.1)
SODIUM: 134 mmol/L — AB (ref 135–145)
Total Bilirubin: 0.6 mg/dL (ref 0.3–1.2)
Total Protein: 8.1 g/dL (ref 6.5–8.1)

## 2017-05-21 LAB — URINALYSIS, ROUTINE W REFLEX MICROSCOPIC
Bilirubin Urine: NEGATIVE
GLUCOSE, UA: NEGATIVE mg/dL
HGB URINE DIPSTICK: NEGATIVE
KETONES UR: 80 mg/dL — AB
Leukocytes, UA: NEGATIVE
NITRITE: NEGATIVE
PROTEIN: 30 mg/dL — AB
Specific Gravity, Urine: 1.034 — ABNORMAL HIGH (ref 1.005–1.030)
pH: 6 (ref 5.0–8.0)

## 2017-05-21 LAB — HCG, QUANTITATIVE, PREGNANCY: hCG, Beta Chain, Quant, S: 110579 m[IU]/mL — ABNORMAL HIGH (ref <5)

## 2017-05-21 LAB — POC URINE PREG, ED: Preg Test, Ur: POSITIVE — AB

## 2017-05-21 LAB — LIPASE, BLOOD: LIPASE: 26 U/L (ref 11–51)

## 2017-05-21 MED ORDER — SODIUM CHLORIDE 0.9 % IV BOLUS
1000.0000 mL | Freq: Once | INTRAVENOUS | Status: AC
  Administered 2017-05-21: 1000 mL via INTRAVENOUS

## 2017-05-21 MED ORDER — ONDANSETRON 4 MG PO TBDP
4.0000 mg | ORAL_TABLET | Freq: Once | ORAL | Status: AC
  Administered 2017-05-21: 4 mg via ORAL

## 2017-05-21 MED ORDER — METOCLOPRAMIDE HCL 5 MG/ML IJ SOLN
10.0000 mg | Freq: Once | INTRAMUSCULAR | Status: AC
  Administered 2017-05-21: 10 mg via INTRAVENOUS
  Filled 2017-05-21: qty 2

## 2017-05-21 MED ORDER — METOCLOPRAMIDE HCL 10 MG PO TABS: 10.0000 mg | ORAL_TABLET | Freq: Three times a day (TID) | ORAL | 0 refills | Status: DC | PRN

## 2017-05-21 MED ORDER — ONDANSETRON 4 MG PO TBDP
ORAL_TABLET | ORAL | Status: AC
  Filled 2017-05-21: qty 1

## 2017-05-21 NOTE — ED Notes (Signed)
Pt's POC was positive. Not crossing over in the system

## 2017-05-21 NOTE — ED Provider Notes (Signed)
[name] EMERGENCY DEPARTMENT Provider Note   CSN: [id number] Arrival date & time: 05/21/17  1332     History   Chief Complaint Chief Complaint  Patient presents with  . Emesis    [redacted] weeks pregnant    HPI Nicole Freeman is a 22 y.o. female.  HPI  The patient is a 22 year old female, she reports a history of von Willebrand disease but has no other chronic medical conditions.  She is pregnant at approximately 5 weeks based on her last menstrual period, she is scheduled to see the OB/GYN next week.  She reports that over the last 3 days she has had progressive nausea and vomiting and today has not been able to hold anything down.  She reports measuring a temperature of over 101 at home but has no other symptoms of infection including coughing shortness of breath, dysuria diarrhea, headache, or vision, she does report some history of sore throat but only after she started vomiting.  No sick contacts, tried to take some over-the-counter nausea medicines with minimal relief.  No other sick contacts.  Denies urinary frequency but has had some darkened urine color over the last couple of days.  G2 P1 at [redacted] weeks gestation based on last menstrual.  Past Medical History:  Diagnosis Date  . Von Willebrand disease (HCC)     There are no active problems to display for this patient.   History reviewed. No pertinent surgical history.   OB History   None      Home Medications    Prior to Admission medications   Medication Sig Start Date End Date Taking? Authorizing Provider  acetaminophen (TYLENOL) 325 MG tablet Take 325 mg by mouth every 6 (six) hours as needed.   Yes [provider]  Prenatal Vit-Fe Fumarate-FA (MULTIVITAMIN-PRENATAL) 27-0.8 MG TABS tablet Take 1 tablet by mouth daily at 12 noon.   Yes [provider]  metoCLOPramide (REGLAN) 10 MG tablet Take 1 tablet (10 mg total) by mouth 3 (three) times daily as needed for nausea (headache / nausea). 05/21/17    Eber Hong, MD    Family History History reviewed. No pertinent family history.  Social History Social History   Tobacco Use  . Smoking status: Current Every Day Smoker    Packs/day: 0.50  . Smokeless tobacco: Never Used  Substance Use Topics  . Alcohol use: Yes  . Drug use: No     Allergies   Patient has no known allergies.   Review of Systems Review of Systems  All other systems reviewed and are negative.    Physical Exam Updated Vital Signs BP 133/74 (BP Location: Left Arm)   Pulse 89   Temp 98.7 F (37.1 C) (Oral)   Resp 16   Wt 77.1 kg (170 lb)   LMP 04/12/2017   SpO2 100%   BMI 31.09 kg/m   Physical Exam  Constitutional: She appears well-developed and well-nourished. No distress.  HENT:  Head: Normocephalic and atraumatic.  Mouth/Throat: No oropharyngeal exudate.  Mucous membranes appear dehydrated  Eyes: Pupils are equal, round, and reactive to light. Conjunctivae and EOM are normal. Right eye exhibits no discharge. Left eye exhibits no discharge. No scleral icterus.  Neck: Normal range of motion. Neck supple. No JVD present. No thyromegaly present.  Cardiovascular: Normal rate, regular rhythm, normal heart sounds and intact distal pulses. Exam reveals no gallop and no friction rub.  No murmur heard. Pulmonary/Chest: Effort normal and breath sounds normal. No respiratory distress. She  has no wheezes. She has no rales.  Abdominal: Soft. Bowel sounds are normal. She exhibits no distension and no mass. There is tenderness ( Minimal left lower quadrant tenderness without guarding).  The abdomen is very soft and minimally tender  Musculoskeletal: Normal range of motion. She exhibits no edema or tenderness.  Lymphadenopathy:    She has no cervical adenopathy.  Neurological: She is alert. Coordination normal.  Skin: Skin is warm and dry. No rash noted. No erythema.  Psychiatric: She has a normal mood and affect. Her behavior is normal.  Nursing note  and vitals reviewed.    ED Treatments / Results  Labs (all labs ordered are listed, but only abnormal results are displayed) Labs Reviewed  COMPREHENSIVE METABOLIC PANEL - Abnormal; Notable for the following components:      Result Value   Sodium 134 (*)    CO2 20 (*)    All other components within normal limits  URINALYSIS, ROUTINE W REFLEX MICROSCOPIC - Abnormal; Notable for the following components:   APPearance HAZY (*)    Specific Gravity, Urine 1.034 (*)    Ketones, ur 80 (*)    Protein, ur 30 (*)    Bacteria, UA RARE (*)    Squamous Epithelial / LPF 0-5 (*)    All other components within normal limits  HCG, QUANTITATIVE, PREGNANCY - Abnormal; Notable for the following components:   hCG, Beta Chain, Quant, S 110,579 (*)    All other components within normal limits  LIPASE, BLOOD  CBC  POC URINE PREG, ED    EKG None  Radiology No results found.  Procedures Procedures (including critical care time)  Medications Ordered in ED Medications  sodium chloride 0.9 % bolus 1,000 mL (1,000 mLs Intravenous New Bag/Given 05/21/17 1707)  ondansetron (ZOFRAN-ODT) 4 MG disintegrating tablet (has no administration in time range)  metoCLOPramide (REGLAN) injection 10 mg (10 mg Intravenous Given 05/21/17 1447)  sodium chloride 0.9 % bolus 1,000 mL (0 mLs Intravenous Stopped 05/21/17 1623)  ondansetron (ZOFRAN-ODT) disintegrating tablet 4 mg (4 mg Oral Given 05/21/17 1727)     Initial Impression / Assessment and Plan / ED Course  I have reviewed the triage vital signs and the nursing notes.  Pertinent labs & imaging results that were available during my care of the patient were reviewed by me and considered in my medical decision making (see chart for details).    The patient is afebrile and not tachycardic or hypotensive, she is in very early pregnancy which we will confirm with a urine pregnancy test and a urinalysis to make sure she does not have an infection however this may  just be related to hyperemesis gravidarum.  IV fluids and Reglan have been ordered.  The patient is in agreement.  She has no other signs of infection and no other complaints.  The patient states she improved almost immediately with Reglan, she has been given 2 L of IV fluids, she requested a dose of another antiemetic, 1 dose of Zofran was given, she is well-appearing, she tried to eat a [place] fry and a bite of fried chicken in the ER and felt nauseated, I advised her on an appropriate diet for the next several days and she expressed understanding.  She is stable for discharge, labs show no leukocytosis, no anemia, no renal dysfunction, she did have some ketonuria consistent with dehydration.  Final Clinical Impressions(s) / ED Diagnoses   Final diagnoses:  Hyperemesis  Dehydration    ED Discharge Orders  Ordered    metoCLOPramide (REGLAN) 10 MG tablet  3 times daily PRN     05/21/17 1732       Eber Hong, MD 05/21/17 1733

## 2017-05-21 NOTE — ED Triage Notes (Signed)
Pt states she is [redacted] weeks pregnant. C/o n/v x 3 days and unable to tkeep anything down. Nad. Mm moist.

## 2017-05-21 NOTE — Discharge Instructions (Signed)
Take Reglan, 1 tablet every 8 hours as needed for nausea, please drink only clear liquids for the next 24 hours and if you are going to eat to take applesauce, smoothies or other foods that are easy on the stomach.  Please avoid fried foods

## 2017-05-28 DIAGNOSIS — D6800 Von Willebrand disease, unspecified: Secondary | ICD-10-CM | POA: Diagnosis present

## 2017-05-28 DIAGNOSIS — Z720 Tobacco use: Secondary | ICD-10-CM | POA: Diagnosis present

## 2017-08-30 DIAGNOSIS — Z3689 Encounter for other specified antenatal screening: Secondary | ICD-10-CM | POA: Diagnosis not present

## 2017-08-30 DIAGNOSIS — O365990 Maternal care for other known or suspected poor fetal growth, unspecified trimester, not applicable or unspecified: Secondary | ICD-10-CM | POA: Diagnosis not present

## 2017-08-30 DIAGNOSIS — Z3A21 21 weeks gestation of pregnancy: Secondary | ICD-10-CM | POA: Diagnosis not present

## 2017-09-27 DIAGNOSIS — Z3689 Encounter for other specified antenatal screening: Secondary | ICD-10-CM | POA: Diagnosis not present

## 2017-09-27 DIAGNOSIS — Z23 Encounter for immunization: Secondary | ICD-10-CM | POA: Diagnosis not present

## 2017-10-18 DIAGNOSIS — Z23 Encounter for immunization: Secondary | ICD-10-CM | POA: Diagnosis not present

## 2018-01-01 DIAGNOSIS — Z3483 Encounter for supervision of other normal pregnancy, third trimester: Secondary | ICD-10-CM | POA: Diagnosis not present

## 2018-01-01 DIAGNOSIS — Z3689 Encounter for other specified antenatal screening: Secondary | ICD-10-CM | POA: Diagnosis not present

## 2018-01-01 DIAGNOSIS — Z3A38 38 weeks gestation of pregnancy: Secondary | ICD-10-CM | POA: Diagnosis not present

## 2018-01-07 ENCOUNTER — Inpatient Hospital Stay (EMERGENCY_DEPARTMENT_HOSPITAL)
Admission: AD | Admit: 2018-01-07 | Discharge: 2018-01-07 | Disposition: A | Discharge status: Home / Self Care | Payer: Medicaid Other | Source: Ambulatory Visit | Attending: Family Medicine | Admitting: Family Medicine

## 2018-01-07 ENCOUNTER — Encounter (HOSPITAL_COMMUNITY): Payer: Self-pay | Admitting: *Deleted

## 2018-01-07 DIAGNOSIS — Z3A39 39 weeks gestation of pregnancy: Secondary | ICD-10-CM

## 2018-01-07 DIAGNOSIS — O471 False labor at or after 37 completed weeks of gestation: Secondary | ICD-10-CM | POA: Insufficient documentation

## 2018-01-07 DIAGNOSIS — O479 False labor, unspecified: Secondary | ICD-10-CM | POA: Diagnosis not present

## 2018-01-07 NOTE — MAU Note (Signed)
PT SAYS UC STRONG  SINCE 3 PM.   PNC WITH    [facility]   IN [place]-   WITH DR GALLOWAY - BUT  PLANNED TO DELIVER HERE.     VE  LAST WEEK-  WITH DR MCCLOUD 1-2 CM . DENIES   HSV AND MRSA.  GBS-  NEG

## 2018-01-07 NOTE — Discharge Instructions (Signed)
Braxton Hicks Contractions °Contractions of the uterus can occur throughout pregnancy, but they are not always a sign that you are in labor. You may have practice contractions called Braxton Hicks contractions. These false labor contractions are sometimes confused with true labor. °What are Braxton Hicks contractions? °Braxton Hicks contractions are tightening movements that occur in the muscles of the uterus before labor. Unlike true labor contractions, these contractions do not result in opening (dilation) and thinning of the cervix. Toward the end of pregnancy (32-34 weeks), Braxton Hicks contractions can happen more often and may become stronger. These contractions are sometimes difficult to tell apart from true labor because they can be very uncomfortable. You should not feel embarrassed if you go to the hospital with false labor. °Sometimes, the only way to tell if you are in true labor is for your health care provider to look for changes in the cervix. The health care provider will do a physical exam and may monitor your contractions. If you are not in true labor, the exam should show that your cervix is not dilating and your water has not broken. °If there are other health problems associated with your pregnancy, it is completely safe for you to be sent home with false labor. You may continue to have Braxton Hicks contractions until you go into true labor. °How to tell the difference between true labor and false labor °True labor °· Contractions last 30-70 seconds. °· Contractions become very regular. °· Discomfort is usually felt in the top of the uterus, and it spreads to the lower abdomen and low back. °· Contractions do not go away with walking. °· Contractions usually become more intense and increase in frequency. °· The cervix dilates and gets thinner. °False labor °· Contractions are usually shorter and not as strong as true labor contractions. °· Contractions are usually irregular. °· Contractions  are often felt in the front of the lower abdomen and in the groin. °· Contractions may go away when you walk around or change positions while lying down. °· Contractions get weaker and are shorter-lasting as time goes on. °· The cervix usually does not dilate or become thin. °Follow these instructions at home: °· Take over-the-counter and prescription medicines only as told by your health care provider. °· Keep up with your usual exercises and follow other instructions from your health care provider. °· Eat and drink lightly if you think you are going into labor. °· If Braxton Hicks contractions are making you uncomfortable: °? Change your position from lying down or resting to walking, or change from walking to resting. °? Sit and rest in a tub of warm water. °? Drink enough fluid to keep your urine pale yellow. Dehydration may cause these contractions. °? Do slow and deep breathing several times an hour. °· Keep all follow-up prenatal visits as told by your health care provider. This is important. °Contact a health care provider if: °· You have a fever. °· You have continuous pain in your abdomen. °Get help right away if: °· Your contractions become stronger, more regular, and closer together. °· You have fluid leaking or gushing from your vagina. °· You pass blood-tinged mucus (bloody show). °· You have bleeding from your vagina. °· You have low back pain that you never had before. °· You feel your baby’s head pushing down and causing pelvic pressure. °· Your baby is not moving inside you as much as it used to. °Summary °· Contractions that occur before labor are called Braxton   Hicks contractions, false labor, or practice contractions. °· Braxton Hicks contractions are usually shorter, weaker, farther apart, and less regular than true labor contractions. True labor contractions usually become progressively stronger and regular and they become more frequent. °· Manage discomfort from Braxton Hicks contractions by  changing position, resting in a warm bath, drinking plenty of water, or practicing deep breathing. °This information is not intended to replace advice given to you by your health care provider. Make sure you discuss any questions you have with your health care provider. °Document Released: 06/07/2016 Document Revised: 06/07/2016 Document Reviewed: 06/07/2016 °Elsevier Interactive Patient Education © 2018 [employer] Inc[employer] ° °

## 2018-01-07 NOTE — MAU Provider Note (Signed)
S: [name] is a 22 y.o. G2P1001 at 9273w4d  who presents to MAU today complaining contractions q 5 minutes since 1500. She denies vaginal bleeding. She denies LOF. She reports normal fetal movement.    O: BP 123/73 (BP Location: Right Arm)   Pulse 89   Temp 98 F (36.7 C) (Oral)   Resp 20   Ht 5\' 2"  (1.575 m)   Wt 91.6 kg   LMP 04/12/2017   BMI 36.95 kg/m  GENERAL: Well-developed, well-nourished female in no acute distress.  HEAD: Normocephalic, atraumatic.  CHEST: Normal effort of breathing, regular heart rate ABDOMEN: Soft, nontender, gravid  Cervical exam:  Dilation: 2 Effacement (%): 70 Cervical Position: Posterior Station: -3 Presentation: Vertex Exam by:: Nicole Enganielle Simpson RN   Fetal Monitoring: Baseline: 120 Variability: moderate Accelerations: 15x15 Decelerations: none Contractions: none   A: SIUP at 4673w4d  False labor  P: Discharge patient home Labor precautions reviewed [facility] Patient may return to MAU as needed  Rolm Bookbindereill, Christen Wardrop M, [place] 01/07/2018 8:43 PM

## 2018-01-08 ENCOUNTER — Inpatient Hospital Stay (HOSPITAL_COMMUNITY)
Admission: AD | Admit: 2018-01-08 | Discharge: 2018-01-09 | DRG: 806 | Disposition: A | Discharge status: Home / Self Care | Payer: Medicaid Other | Attending: Obstetrics and Gynecology | Admitting: Obstetrics and Gynecology

## 2018-01-08 ENCOUNTER — Inpatient Hospital Stay (HOSPITAL_COMMUNITY): Payer: Medicaid Other | Admitting: Anesthesiology

## 2018-01-08 ENCOUNTER — Encounter (HOSPITAL_COMMUNITY): Payer: Self-pay | Admitting: *Deleted

## 2018-01-08 DIAGNOSIS — Z3A39 39 weeks gestation of pregnancy: Secondary | ICD-10-CM

## 2018-01-08 DIAGNOSIS — Z3483 Encounter for supervision of other normal pregnancy, third trimester: Secondary | ICD-10-CM | POA: Diagnosis present

## 2018-01-08 DIAGNOSIS — O9912 Other diseases of the blood and blood-forming organs and certain disorders involving the immune mechanism complicating childbirth: Principal | ICD-10-CM | POA: Diagnosis present

## 2018-01-08 DIAGNOSIS — D6800 Von Willebrand disease, unspecified: Secondary | ICD-10-CM | POA: Diagnosis present

## 2018-01-08 DIAGNOSIS — Z87891 Personal history of nicotine dependence: Secondary | ICD-10-CM

## 2018-01-08 DIAGNOSIS — O99212 Obesity complicating pregnancy, second trimester: Secondary | ICD-10-CM | POA: Diagnosis not present

## 2018-01-08 DIAGNOSIS — Z720 Tobacco use: Secondary | ICD-10-CM | POA: Diagnosis present

## 2018-01-08 LAB — CBC
HCT: 38.7 % (ref 36.0–46.0)
HEMOGLOBIN: 12.4 g/dL (ref 12.0–15.0)
MCH: 30 pg (ref 26.0–34.0)
MCHC: 32 g/dL (ref 30.0–36.0)
MCV: 93.7 fL (ref 80.0–100.0)
Platelets: 276 10*3/uL (ref 150–400)
RBC: 4.13 MIL/uL (ref 3.87–5.11)
RDW: 16.5 % — ABNORMAL HIGH (ref 11.5–15.5)
WBC: 14.7 10*3/uL — AB (ref 4.0–10.5)
nRBC: 0 % (ref 0.0–0.2)

## 2018-01-08 LAB — ABO/RH: ABO/RH(D): A POS

## 2018-01-08 LAB — TYPE AND SCREEN
ABO/RH(D): A POS
Antibody Screen: NEGATIVE

## 2018-01-08 LAB — RPR: RPR Ser Ql: NONREACTIVE

## 2018-01-08 MED ORDER — PHENYLEPHRINE 40 MCG/ML (10ML) SYRINGE FOR IV PUSH (FOR BLOOD PRESSURE SUPPORT)
80.0000 ug | PREFILLED_SYRINGE | INTRAVENOUS | Status: DC | PRN
  Filled 2018-01-08: qty 5

## 2018-01-08 MED ORDER — ACETAMINOPHEN 325 MG PO TABS
650.0000 mg | ORAL_TABLET | ORAL | Status: DC | PRN
  Administered 2018-01-08: 650 mg via ORAL
  Filled 2018-01-08: qty 2

## 2018-01-08 MED ORDER — OXYTOCIN 40 UNITS IN LACTATED RINGERS INFUSION - SIMPLE MED: 2.5000 [IU]/h | INTRAVENOUS | Status: DC

## 2018-01-08 MED ORDER — LACTATED RINGERS IV SOLN
INTRAVENOUS | Status: DC
  Administered 2018-01-08 (×2): via INTRAVENOUS

## 2018-01-08 MED ORDER — FENTANYL CITRATE (PF) 100 MCG/2ML IJ SOLN: 100.0000 ug | INTRAMUSCULAR | Status: DC | PRN

## 2018-01-08 MED ORDER — DIBUCAINE 1 % RE OINT: 1.0000 "application " | TOPICAL_OINTMENT | RECTAL | Status: DC | PRN

## 2018-01-08 MED ORDER — IBUPROFEN 600 MG PO TABS
600.0000 mg | ORAL_TABLET | Freq: Four times a day (QID) | ORAL | Status: DC
  Administered 2018-01-08 – 2018-01-09 (×5): 600 mg via ORAL
  Filled 2018-01-08 (×5): qty 1

## 2018-01-08 MED ORDER — SENNOSIDES-DOCUSATE SODIUM 8.6-50 MG PO TABS
2.0000 | ORAL_TABLET | ORAL | Status: DC
  Administered 2018-01-09: 2 via ORAL
  Filled 2018-01-08: qty 2

## 2018-01-08 MED ORDER — BENZOCAINE-MENTHOL 20-0.5 % EX AERO
1.0000 "application " | INHALATION_SPRAY | CUTANEOUS | Status: DC | PRN
  Filled 2018-01-08: qty 56

## 2018-01-08 MED ORDER — ZOLPIDEM TARTRATE 5 MG PO TABS: 5.0000 mg | ORAL_TABLET | Freq: Every evening | ORAL | Status: DC | PRN

## 2018-01-08 MED ORDER — ONDANSETRON HCL 4 MG/2ML IJ SOLN: 4.0000 mg | INTRAMUSCULAR | Status: DC | PRN

## 2018-01-08 MED ORDER — TETANUS-DIPHTH-ACELL PERTUSSIS 5-2.5-18.5 LF-MCG/0.5 IM SUSP: 0.5000 mL | Freq: Once | INTRAMUSCULAR | Status: DC

## 2018-01-08 MED ORDER — DIPHENHYDRAMINE HCL 50 MG/ML IJ SOLN: 12.5000 mg | INTRAMUSCULAR | Status: DC | PRN

## 2018-01-08 MED ORDER — LIDOCAINE HCL (PF) 1 % IJ SOLN
INTRAMUSCULAR | Status: DC | PRN
  Administered 2018-01-08: 5 mL via EPIDURAL

## 2018-01-08 MED ORDER — OXYTOCIN BOLUS FROM INFUSION
500.0000 mL | Freq: Once | INTRAVENOUS | Status: AC
  Administered 2018-01-08: 500 mL via INTRAVENOUS

## 2018-01-08 MED ORDER — LACTATED RINGERS IV SOLN: 500.0000 mL | INTRAVENOUS | Status: DC | PRN

## 2018-01-08 MED ORDER — SOD CITRATE-CITRIC ACID 500-334 MG/5ML PO SOLN: 30.0000 mL | ORAL | Status: DC | PRN

## 2018-01-08 MED ORDER — DIPHENHYDRAMINE HCL 25 MG PO CAPS: 25.0000 mg | ORAL_CAPSULE | Freq: Four times a day (QID) | ORAL | Status: DC | PRN

## 2018-01-08 MED ORDER — ONDANSETRON HCL 4 MG PO TABS: 4.0000 mg | ORAL_TABLET | ORAL | Status: DC | PRN

## 2018-01-08 MED ORDER — EPHEDRINE 5 MG/ML INJ: 10.0000 mg | INTRAVENOUS | Status: DC | PRN

## 2018-01-08 MED ORDER — WITCH HAZEL-GLYCERIN EX PADS: 1.0000 "application " | MEDICATED_PAD | CUTANEOUS | Status: DC | PRN

## 2018-01-08 MED ORDER — LACTATED RINGERS IV SOLN
500.0000 mL | Freq: Once | INTRAVENOUS | Status: AC
  Administered 2018-01-08: 500 mL via INTRAVENOUS

## 2018-01-08 MED ORDER — MEASLES, MUMPS & RUBELLA VAC IJ SOLR: 0.5000 mL | Freq: Once | INTRAMUSCULAR | Status: DC

## 2018-01-08 MED ORDER — LIDOCAINE HCL (PF) 1 % IJ SOLN
INTRAMUSCULAR | Status: AC
  Filled 2018-01-08: qty 30

## 2018-01-08 MED ORDER — PHENYLEPHRINE 40 MCG/ML (10ML) SYRINGE FOR IV PUSH (FOR BLOOD PRESSURE SUPPORT)
PREFILLED_SYRINGE | INTRAVENOUS | Status: AC
  Filled 2018-01-08: qty 10

## 2018-01-08 MED ORDER — OXYCODONE HCL 5 MG PO TABS
5.0000 mg | ORAL_TABLET | ORAL | Status: DC | PRN
  Filled 2018-01-08 (×3): qty 1

## 2018-01-08 MED ORDER — OXYCODONE-ACETAMINOPHEN 5-325 MG PO TABS: 1.0000 | ORAL_TABLET | ORAL | Status: DC | PRN

## 2018-01-08 MED ORDER — FLEET ENEMA 7-19 GM/118ML RE ENEM: 1.0000 | ENEMA | RECTAL | Status: DC | PRN

## 2018-01-08 MED ORDER — LACTATED RINGERS IV SOLN: 500.0000 mL | Freq: Once | INTRAVENOUS | Status: DC

## 2018-01-08 MED ORDER — ONDANSETRON HCL 4 MG/2ML IJ SOLN: 4.0000 mg | Freq: Four times a day (QID) | INTRAMUSCULAR | Status: DC | PRN

## 2018-01-08 MED ORDER — SIMETHICONE 80 MG PO CHEW: 80.0000 mg | CHEWABLE_TABLET | ORAL | Status: DC | PRN

## 2018-01-08 MED ORDER — LIDOCAINE HCL (PF) 1 % IJ SOLN
30.0000 mL | INTRAMUSCULAR | Status: DC | PRN
  Administered 2018-01-08: 30 mL via SUBCUTANEOUS
  Filled 2018-01-08: qty 30

## 2018-01-08 MED ORDER — FENTANYL 2.5 MCG/ML BUPIVACAINE 1/10 % EPIDURAL INFUSION (WH - ANES)
14.0000 mL/h | INTRAMUSCULAR | Status: DC | PRN
  Administered 2018-01-08: 14 mL/h via EPIDURAL

## 2018-01-08 MED ORDER — COCONUT OIL OIL: 1.0000 "application " | TOPICAL_OIL | Status: DC | PRN

## 2018-01-08 MED ORDER — OXYCODONE-ACETAMINOPHEN 5-325 MG PO TABS: 2.0000 | ORAL_TABLET | ORAL | Status: DC | PRN

## 2018-01-08 MED ORDER — PRENATAL MULTIVITAMIN CH
1.0000 | ORAL_TABLET | Freq: Every day | ORAL | Status: DC
  Administered 2018-01-08 – 2018-01-09 (×2): 1 via ORAL
  Filled 2018-01-08 (×2): qty 1

## 2018-01-08 MED ORDER — ACETAMINOPHEN 325 MG PO TABS
650.0000 mg | ORAL_TABLET | ORAL | Status: DC | PRN
  Administered 2018-01-08 – 2018-01-09 (×3): 650 mg via ORAL
  Filled 2018-01-08 (×3): qty 2

## 2018-01-08 MED ORDER — EPHEDRINE 5 MG/ML INJ
10.0000 mg | INTRAVENOUS | Status: DC | PRN
  Filled 2018-01-08: qty 2

## 2018-01-08 MED ORDER — PHENYLEPHRINE 40 MCG/ML (10ML) SYRINGE FOR IV PUSH (FOR BLOOD PRESSURE SUPPORT): 80.0000 ug | PREFILLED_SYRINGE | INTRAVENOUS | Status: DC | PRN

## 2018-01-08 MED ORDER — OXYCODONE HCL 5 MG PO TABS
5.0000 mg | ORAL_TABLET | ORAL | Status: DC | PRN
  Administered 2018-01-08 – 2018-01-09 (×4): 5 mg via ORAL
  Filled 2018-01-08: qty 1

## 2018-01-08 MED ORDER — OXYTOCIN 40 UNITS IN LACTATED RINGERS INFUSION - SIMPLE MED
INTRAVENOUS | Status: AC
  Filled 2018-01-08: qty 1000

## 2018-01-08 MED ORDER — FENTANYL 2.5 MCG/ML BUPIVACAINE 1/10 % EPIDURAL INFUSION (WH - ANES)
INTRAMUSCULAR | Status: AC
  Filled 2018-01-08: qty 100

## 2018-01-08 NOTE — Anesthesia Preprocedure Evaluation (Signed)
Anesthesia Evaluation  Patient identified by MRN, date of birth, ID band Patient awake    Reviewed: Allergy & Precautions, H&P , NPO status , Patient's Chart, lab work & pertinent test results  Airway Mallampati: II  TM Distance: >3 FB Neck ROM: Full    Dental no notable dental hx. (+) Teeth Intact   Pulmonary neg pulmonary ROS, former smoker,    Pulmonary exam normal breath sounds clear to auscultation       Cardiovascular Exercise Tolerance: Good negative cardio ROS Normal cardiovascular exam Rhythm:Regular Rate:Normal     Neuro/Psych negative neurological ROS  negative psych ROS   GI/Hepatic   Endo/Other    Renal/GU      Musculoskeletal   Abdominal (+) + obese,   Peds  Hematology   Anesthesia Other Findings   Reproductive/Obstetrics (+) Pregnancy                             Lab Results  Component Value Date   WBC 14.7 (H) 01/08/2018   HGB 12.4 01/08/2018   HCT 38.7 01/08/2018   MCV 93.7 01/08/2018   PLT 276 01/08/2018    Anesthesia Physical Anesthesia Plan  ASA: III  Anesthesia Plan: Epidural   Post-op Pain Management:    Induction:   PONV Risk Score and Plan:   Airway Management Planned:   Additional Equipment:   Intra-op Plan:   Post-operative Plan:   Informed Consent: I have reviewed the patients History and Physical, chart, labs and discussed the procedure including the risks, benefits and alternatives for the proposed anesthesia with the patient or authorized representative who has indicated his/her understanding and acceptance.     Plan Discussed with:   Anesthesia Plan Comments:         Anesthesia Quick Evaluation

## 2018-01-08 NOTE — Addendum Note (Signed)
Addendum  created 01/08/18 1255 by Cleda ClarksBrowder, Goldie Tregoning R, CRNA   Sign clinical note

## 2018-01-08 NOTE — H&P (Signed)
Nicole GarnetKelsey E Johnson is a 22 y.o. 572P1001 female at 6912w5d by LMP c/w early u/s, presenting in active labor. Was 2cm when here earlier, now 4cm.   Reports active fetal movement, contractions: regular, vaginal bleeding: none, membranes: intact. [facility] [place] first [place].   This pregnancy complicated by: Von Willebrands disease, dx 1154yr after delivery of her son, no problems w/ bleeding w/ his birth or ever, did have some heavy periods. Was referred to hematology by OB at beginning of pregnancy, never went.  Prenatal History/Complications:  Term SVB x 1  Past Medical History: Past Medical History:  Diagnosis Date  . Von Willebrand disease (HCC)     Past Surgical History: Past Surgical History:  Procedure Laterality Date  . WISDOM TOOTH EXTRACTION      Obstetrical History: OB History    Gravida  2   Para  1   Term  1   Preterm      AB      Living  1     SAB      TAB      Ectopic      Multiple      Live Births  1           Social History: Social History   Socioeconomic History  . Marital status: Divorced    Spouse name: Not on file  . Number of children: Not on file  . Years of education: Not on file  . Highest education level: Not on file  Occupational History  . Not on file  Social Needs  . Financial resource strain: Not on file  . Food insecurity:    Worry: Not on file    Inability: Not on file  . Transportation needs:    Medical: Not on file    Non-medical: Not on file  Tobacco Use  . Smoking status: Former Smoker    Packs/day: 0.50  . Smokeless tobacco: Never Used  . Tobacco comment: quit April 2019  Substance and Sexual Activity  . Alcohol use: Yes  . Drug use: No  . Sexual activity: Not on file  Lifestyle  . Physical activity:    Days per week: Not on file    Minutes per session: Not on file  . Stress: Not on file  Relationships  . Social connections:    Talks on phone: Not on file     Gets together: Not on file    Attends religious service: Not on file    Active member of club or organization: Not on file    Attends meetings of clubs or organizations: Not on file    Relationship status: Not on file  Other Topics Concern  . Not on file  Social History Narrative  . Not on file    Family History: History reviewed. No pertinent family history.  Allergies: No Known Allergies  Medications Prior to Admission  Medication Sig Dispense Refill Last Dose  . acetaminophen (TYLENOL) 325 MG tablet Take 325 mg by mouth every 6 (six) hours as needed.   05/20/2017 at Unknown time  . metoCLOPramide (REGLAN) 10 MG tablet Take 1 tablet (10 mg total) by mouth 3 (three) times daily as needed for nausea (headache / nausea). 20 tablet 0   . Prenatal Vit-Fe Fumarate-FA (MULTIVITAMIN-PRENATAL) 27-0.8 MG TABS tablet Take 1 tablet by mouth daily at 12 noon.   01/06/2018 at Unknown time    Review of Systems  Pertinent pos/neg as  indicated in HPI  Blood pressure 117/80, pulse 98, temperature (!) 97.5 F (36.4 C), resp. rate 19, last menstrual period 04/12/2017, SpO2 99 %. General appearance: alert, cooperative and mild distress Lungs: clear to auscultation bilaterally Heart: regular rate and rhythm Abdomen: gravid, soft, non-tender Extremities: tr edema DTR's 2+  Fetal monitoring: FHR: 125 bpm, variability: minimal ,  Accelerations: Present,  decelerations:  Absent Uterine activity: q 2-15mins Dilation: 6.5 Effacement (%): 90 Station: -1 Exam by:: e poore rnc Presentation: cephalic   Prenatal labs: ABO, Rh:  A+ Antibody:  neg Rubella:  imm RPR:   nr HBsAg:   neg HIV:   neg GBS:   neg  1hr glucola: 84 Genetic screening:  normal Anatomy US: normal  Results for orders placed or performed during the hospital encounter of 01/08/18 (from the past 24 hour(s))  CBC   Collection Time: 01/08/18  2:28 AM  Result Value Ref Range   WBC 14.7 (H) 4.0 - 10.5 K/uL   RBC 4.13 3.87  - 5.11 MIL/uL   Hemoglobin 12.4 12.0 - 15.0 g/dL   HCT 81.1 91.4 - 78.2 %   MCV 93.7 80.0 - 100.0 fL   MCH 30.0 26.0 - 34.0 pg   MCHC 32.0 30.0 - 36.0 g/dL   RDW 95.6 (H) 21.3 - 08.6 %   Platelets 276 150 - 400 K/uL   nRBC 0.0 0.0 - 0.2 %     Assessment:  [redacted]w[redacted]d SIUP  G2P1001  Active labor  Von Willebrands disease  Cat 2 FHR  GBS  neg  Plan:  Admit to BS  IV pain meds/epidural prn active labor  Expectant management  Anticipate NSVB   Discussed VW w/ Dr. Adrian Blackwater, nothing needed at this time  Plans to breastfeed  Contraception: depo  Circumcision: outpatient  Cheral Marker CNM, WHNP-BC 01/08/2018, 3:10 AM

## 2018-01-08 NOTE — Anesthesia Postprocedure Evaluation (Signed)
Anesthesia Post Note  Patient: Nicole Freeman  Procedure(s) Performed: AN AD HOC LABOR EPIDURAL     Patient location during evaluation: Mother Baby Anesthesia Type: Epidural Level of consciousness: awake and alert Pain management: pain level controlled Vital Signs Assessment: post-procedure vital signs reviewed and stable Respiratory status: spontaneous breathing, nonlabored ventilation and respiratory function stable Cardiovascular status: stable Postop Assessment: no headache, no backache and epidural receding Anesthetic complications: no    Last Vitals:  Vitals:   01/08/18 0816 01/08/18 0831  BP: 109/62 102/62  Pulse: 91 86  Resp: 18 18  Temp:    SpO2:      Last Pain:  Vitals:   01/08/18 0816  TempSrc:   PainSc: 5    Pain Goal:                 Arely Tinner

## 2018-01-08 NOTE — Anesthesia Procedure Notes (Addendum)
Epidural Patient location during procedure: OB Start time: 01/08/2018 2:59 AM End time: 01/08/2018 3:14 AM  Staffing Anesthesiologist: Trevor IhaHouser, Stephen A, MD Performed: anesthesiologist   Preanesthetic Checklist Completed: patient identified, site marked, surgical consent, pre-op evaluation, timeout performed, IV checked, risks and benefits discussed and monitors and equipment checked  Epidural Patient position: sitting Prep: site prepped and draped and DuraPrep Patient monitoring: continuous pulse ox and blood pressure Approach: midline Location: L3-L4 Injection technique: LOR air  Needle:  Needle type: Tuohy  Needle gauge: 17 G Needle length: 9 cm and 9 Needle insertion depth: 6 cm Catheter type: closed end flexible Catheter size: 19 Gauge Catheter at skin depth: 11 cm Test dose: negative  Assessment Events: blood not aspirated, injection not painful, no injection resistance, negative IV test and no paresthesia  Additional Notes Patient identified. Risks/Benefits/Options discussed with patient including but not limited to bleeding, infection, nerve damage, paralysis, failed block, incomplete pain control, headache, blood pressure changes, nausea, vomiting, reactions to medication both or allergic, itching and postpartum back pain. Confirmed with bedside nurse the patient's most recent platelet count. Confirmed with patient that they are not currently taking any anticoagulation, have any bleeding history or any family history of bleeding disorders. Patient expressed understanding and wished to proceed. All questions were answered. Sterile technique was used throughout the entire procedure. Please see nursing notes for vital signs. Test dose was given through epidural needle and negative prior to continuing to dose epidural or start infusion. Warning signs of high block given to the patient including shortness of breath, tingling/numbness in hands, complete motor block, or any  concerning symptoms with instructions to call for help. Patient was given instructions on fall risk and not to get out of bed. All questions and concerns addressed with instructions to call with any issues. 1 Attempt (S) . Patient tolerated procedure well.

## 2018-01-08 NOTE — Lactation Note (Addendum)
This note was copied from a baby's chart. Lactation Consultation Note  Patient Name: Nicole Freeman CounterKelsey Johnson NWGNF'AToday's Date: 01/08/2018 Reason for consult: Initial assessment;Term  P2 mother whose infant is now 517 hours old.  Mother breast fed her 23 year old for 1 month.  Baby was sleeping in mother's arms as I arrived.  Encouraged feeding 8-12 times/24 hours or sooner if baby shows feeding cues.  Reviewed feeding cues with parents.  Encouraged hand expression before/after feedings to help increase milk supply.  Mother was able to get a couple drops of colostrum earlier today.  Colostrum container provided for any EBM she obtains with hand expression.    Mom made aware of O/P services, breastfeeding support groups, community resources, and our phone # for post-discharge questions.   Father and family members present.  Mother will call for latch assistance as needed.    [facility] but will be applying.  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   Maternal Data Formula Feeding for Exclusion: No Has patient been taught Hand Expression?: Yes Does the patient have breastfeeding experience prior to this delivery?: Yes  Feeding Feeding Type: Breast Fed  LATCH Score Latch: Too sleepy or reluctant, no latch achieved, no sucking elicited.  Audible Swallowing: None  Type of Nipple: Everted at rest and after stimulation  Comfort (Breast/Nipple): Soft / non-tender  Hold (Positioning): Assistance needed to correctly position infant at breast and maintain latch.  LATCH Score: 5  Interventions    Lactation Tools Discussed/Used WIC Program: Yes   Consult Status Consult Status: Follow-up Date: 01/09/18 Follow-up type: In-patient    Ajani Schnieders R Garret Teale 01/08/2018, 3:06 PM

## 2018-01-08 NOTE — MAU Note (Signed)
PT LEFT AT 845PM-    WAS 2 CM   . HAS RETURNED  - UC STRONGER-  MN.      DENIES HSV AND MRSA.

## 2018-01-08 NOTE — Anesthesia Postprocedure Evaluation (Signed)
Anesthesia Post Note  Patient: Nicole Freeman  Procedure(s) Performed: AN AD HOC LABOR EPIDURAL     Patient location during evaluation: Mother Baby Anesthesia Type: Epidural Level of consciousness: awake, awake and alert and oriented Pain management: pain level controlled Vital Signs Assessment: post-procedure vital signs reviewed and stable Respiratory status: spontaneous breathing Cardiovascular status: blood pressure returned to baseline Postop Assessment: no headache, epidural receding, patient able to bend at knees, adequate PO intake, no backache, no apparent nausea or vomiting and able to ambulate Anesthetic complications: no    Last Vitals:  Vitals:   01/08/18 0910 01/08/18 1020  BP: 102/74 109/63  Pulse: 83 86  Resp: 18 18  Temp: 37 C 37 C  SpO2: 99% 99%    Last Pain:  Vitals:   01/08/18 1209  TempSrc:   PainSc: 2    Pain Goal: Patients Stated Pain Goal: 3 (01/08/18 1020)               Peyton BottomsBrowder, Bearl Mulberryobyn R

## 2018-01-09 MED ORDER — SENNOSIDES-DOCUSATE SODIUM 8.6-50 MG PO TABS: 2.0000 | ORAL_TABLET | ORAL | 0 refills | Status: AC

## 2018-01-09 MED ORDER — IBUPROFEN 600 MG PO TABS: 600.0000 mg | ORAL_TABLET | Freq: Four times a day (QID) | ORAL | 0 refills | Status: DC

## 2018-01-09 NOTE — Discharge Summary (Addendum)
Postpartum Discharge Summary     Patient Name: Nicole Freeman DOB: 24-Apr-1995 MRN: [id number]  Date of admission: 01/08/2018 Delivering Provider: Shawna Clamp R   Date of discharge: 01/09/2018  Admitting diagnosis: 39WKS CTX, [place] Intrauterine pregnancy: [redacted]w[redacted]d     Secondary diagnosis:  Active Problems:   Indication for care in labor or delivery   Tobacco use   Von Willebrand disease (HCC)  Additional problems: none     Discharge diagnosis: Term Pregnancy Delivered                                                                                                Post partum procedures:None  Augmentation: AROM  Complications: None  Hospital course:  Onset of Labor With Vaginal Delivery     22 y.o. yo [id number] at [redacted]w[redacted]d was admitted in Active Labor on 01/08/2018. Patient had an uncomplicated labor course as follows:  Membrane Rupture Time/Date: 5:31 AM ,01/08/2018   Intrapartum Procedures: Episiotomy: None [1]                                         Lacerations:  Periurethral [8];1st degree [2]  Patient had a delivery of a Viable infant. 01/08/2018  Information for the patient's newborn:  Taia, Bramlett [[id number]]      Pateint had an uncomplicated postpartum course.  She is ambulating, tolerating a regular diet, passing flatus, and urinating well. Patient is discharged home in stable condition on 01/09/18 per her request for early d/c.   Magnesium Sulfate recieved: No BMZ received: No  Physical exam  Vitals:   01/08/18 1415 01/08/18 1806 01/08/18 2225 01/09/18 0554  BP: 110/71 113/84 121/79 103/82  Pulse: 90 80 82 82  Resp: 18 18    Temp: 97.9 F (36.6 C) 98 F (36.7 C) (!) 97.5 F (36.4 C) 97.7 F (36.5 C)  TempSrc: Oral Oral Oral   SpO2: 98% 99% 98%    General: alert, cooperative and no distress Lochia: appropriate Uterine Fundus: firm Incision: N/A DVT Evaluation: No evidence of DVT seen on physical exam. Negative Homan's sign. No cords or calf  tenderness. No significant calf/ankle edema. Labs: Lab Results  Component Value Date   WBC 14.7 (H) 01/08/2018   HGB 12.4 01/08/2018   HCT 38.7 01/08/2018   MCV 93.7 01/08/2018   PLT 276 01/08/2018   CMP Latest Ref Rng & Units 05/21/2017  Glucose 65 - 99 mg/dL 87  BUN 6 - 20 mg/dL 15  Creatinine 4.01 - 0.27 mg/dL 2.53  Sodium 664 - 403 mmol/L 134(L)  Potassium 3.5 - 5.1 mmol/L 3.7  Chloride 101 - 111 mmol/L 103  CO2 22 - 32 mmol/L 20(L)  Calcium 8.9 - 10.3 mg/dL 9.6  Total Protein 6.5 - 8.1 g/dL 8.1  Total Bilirubin 0.3 - 1.2 mg/dL 0.6  Alkaline Phos 38 - 126 U/L 43  AST 15 - 41 U/L 16  ALT 14 - 54 U/L 14    Discharge instruction: per After Visit Summary and "  Baby and Me Booklet".  After visit meds:  Ibuprofen 600 mg Q6H Senna-K 2 tabs QHS  Diet: routine diet  Activity: Advance as tolerated. Pelvic rest for 6 weeks.   Outpatient follow up:6 weeks Follow up Appt: Future Appointments  Date Time Provider Department Center  02/13/2018 10:00 AM Cheral MarkerBooker, Danh Bayus R, CNM [facility]   Follow up Visit:   Please schedule this patient for Postpartum visit in: 6 weeks with the following provider: Any provider For C/S patients schedule nurse incision check in weeks 2 weeks: no High risk pregnancy complicated by: history of von Willebrand's (mild, without prior hemorrhage) Delivery mode:  SVD Anticipated Birth Control:  Depo PP Procedures needed: None   Newborn Data: Live born female  Birth Weight: 8 lb 3.4 oz (3725 g) APGAR: 9, 9  Newborn Delivery   Birth date/time:  01/08/2018 07:31:00 Delivery type:  Vaginal, Spontaneous     Baby Feeding: Breast Disposition:home with mother   01/09/2018 Awilda MetroSarah A Peiffer, MD  CNM attestation I have seen and examined this patient and agree with above documentation in the resident's note.   Amadeo GarnetKelsey E Johnson is a 22 y.o. [id number] s/p SVD.   Pain is well controlled.  Plan for birth control is Depo-Provera.  Method of Feeding:  breast  PE:  BP 103/82   Pulse 82   Temp 97.7 F (36.5 C)   Resp 18   LMP 04/12/2017   SpO2 98%   Breastfeeding? Unknown  Fundus firm  Recent Labs    01/08/18 0228  HGB 12.4  HCT 38.7     Plan: discharge today - postpartum care discussed - [facility]/u clinic [facility] 4-6 weeks [facility]   Arabella MerlesKimberly D Leela Vanbrocklin, [place] 12:42 PM 01/09/2018

## 2018-01-09 NOTE — Lactation Note (Signed)
This note was copied from a baby's chart. Lactation Consultation Note  Patient Name: Nicole Freeman: 01/09/2018 Reason for consult: Follow-up assessment;Term Mom and baby to be discharged today. Mom reports that feedings are going well.  Instructed to continue feeding with cues.  Discussed milk coming to volume and the prevention and treatment of engorgement.  Hand pump given with instructions.  Questions answered.  Lactation services and support reviewed and encouraged prn.  Maternal Data    Feeding Feeding Type: Breast Fed  LATCH Score Latch: Grasps breast easily, tongue down, lips flanged, rhythmical sucking.  Audible Swallowing: Spontaneous and intermittent  Type of Nipple: Everted at rest and after stimulation  Comfort (Breast/Nipple): Filling, red/small blisters or bruises, mild/mod discomfort  Hold (Positioning): No assistance needed to correctly position infant at breast.  LATCH Score: 9  Interventions Interventions: Hand pump  Lactation Tools Discussed/Used     Consult Status Consult Status: Complete Follow-up type: Call as needed    Rock NephewMOULDEN, Charmagne Buhl S 01/09/2018, 11:17 AM

## 2018-02-13 ENCOUNTER — Encounter: Payer: Self-pay | Admitting: Women's Health

## 2018-02-13 ENCOUNTER — Ambulatory Visit (INDEPENDENT_AMBULATORY_CARE_PROVIDER_SITE_OTHER): Payer: Medicaid Other | Admitting: Women's Health

## 2018-02-13 DIAGNOSIS — Z3009 Encounter for other general counseling and advice on contraception: Secondary | ICD-10-CM

## 2018-02-13 DIAGNOSIS — R232 Flushing: Secondary | ICD-10-CM

## 2018-02-13 NOTE — Progress Notes (Signed)
   POSTPARTUM VISIT Patient name: Nicole Freeman MRN [id number]  Date of birth: Sep 12, 1995 Chief Complaint:   Postpartum Care  History of Present Illness:   Nicole Freeman is a 23 y.o. G47P2002 Caucasian female being seen today for a postpartum visit. She is 4 weeks postpartum following a spontaneous vaginal delivery at 39.5 gestational weeks. Anesthesia: epidural. Laceration: labial and periurethral. I have fully reviewed the prenatal and intrapartum course. 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 Prenatal care in [place], delivered at [facility], and wanted to come to [place] for pp visit.  Postpartum course has been complicated by hot flashes, also reports large amt weight gain prior to pregnancy. Bleeding no bleeding. Bowel function is normal. Bladder function is normal.  Patient is sexually active. Last sexual activity: 2d ago 02/11/17.  Contraception method is none and wants IUD.  [place] Postpartum Depression Screening: negative. Score 0.   Last pap 05/28/17 at [facility].  Results were normal .  No LMP recorded.  Baby's course has been uncomplicated. Baby is feeding by breast.  Review of Systems:   Pertinent items are noted in HPI Denies Abnormal vaginal discharge w/ itching/odor/irritation, headaches, visual changes, shortness of breath, chest pain, abdominal pain, severe nausea/vomiting, or problems with urination or bowel movements. Pertinent History Reviewed:  Reviewed past medical,surgical, obstetrical and family history.  Reviewed problem list, medications and allergies. OB History  Gravida Para Term Preterm AB Living  2 2 2     2   SAB TAB Ectopic Multiple Live Births        0 2    # Outcome Date GA Lbr Len/2nd Weight Sex Delivery Anes PTL Lv  2 Term 01/08/18 [redacted]w[redacted]d 06:36 / 00:55 8 lb 3.4 oz (3.725 kg) M Vag-Spont EPI  LIV  1 Term      Vag-Spont      Physical Assessment:   Vitals:   02/13/18 0911  BP: 126/81  Pulse: 76  Weight: 185 lb  3.2 oz (84 kg)  Height: 5\' 2"  (1.575 m)  Body mass index is 33.87 kg/m.       Physical Examination:   General appearance: alert, well appearing, and in no distress  Mental status: alert, oriented to person, place, and time  Skin: warm & dry   Cardiovascular: normal heart rate noted   Respiratory: normal respiratory effort, no distress   Breasts: deferred, no complaints   Abdomen: soft, non-tender   Pelvic: VULVA: normal appearing vulva with no masses, tenderness or lesions, UTERUS: uterus is normal size, shape, consistency and nontender  Rectal: no hemorrhoids  Extremities: no edema       No results found for this or any previous visit (from the past 24 hour(s)).  Assessment & Plan:  1) Postpartum exam 2) 4 wks s/p SVB 3) Breastfeeding 4) Depression screening 5) Contraception counseling, pt prefers abstinence until IUD insertion  6) Hot flashes, wt gain prior to pregnancy> will check TSH  Meds: No orders of the defined types were placed in this encounter.   Follow-up: Return in about 12 days (around 02/25/2018) for BHCG am/IUD pm.   Orders Placed This Encounter  Procedures  . TSH    Cheral Marker CNM, [facility] 02/13/2018 9:48 AM

## 2018-02-13 NOTE — Patient Instructions (Addendum)
NO SEX UNTIL AFTER YOU GET YOUR BIRTH CONTROL   Tips To Increase Milk Supply  Lots of water! Enough so that your urine is clear  Plenty of calories, if you're not getting enough calories, your milk supply can decrease  Breastfeed/pump often, every 2-3 hours x 20-30mins  Fenugreek 3 pills 3 times a day, this may make your urine smell like maple syrup  Mother's Milk Tea  Lactation cookies, google for the recipe  Real oatmeal   Levonorgestrel intrauterine device (IUD) What is this medicine? LEVONORGESTREL IUD (LEE voe nor jes trel) is a contraceptive (birth control) device. The device is placed inside the uterus by a healthcare professional. It is used to prevent pregnancy. This device can also be used to treat heavy bleeding that occurs during your period. This medicine may be used for other purposes; ask your health care provider or pharmacist if you have questions. COMMON BRAND NAME(S): Kyleena, LILETTA, Mirena, Skyla What should I tell my health care provider before I take this medicine? They need to know if you have any of these conditions: -abnormal Pap smear -cancer of the breast, uterus, or cervix -diabetes -endometritis -genital or pelvic infection now or in the past -have more than one sexual partner or your partner has more than one partner -heart disease -history of an ectopic or tubal pregnancy -immune system problems -IUD in place -liver disease or tumor -problems with blood clots or take blood-thinners -seizures -use intravenous drugs -uterus of unusual shape -vaginal bleeding that has not been explained -an unusual or allergic reaction to levonorgestrel, other hormones, silicone, or polyethylene, medicines, foods, dyes, or preservatives -pregnant or trying to get pregnant -breast-feeding How should I use this medicine? This device is placed inside the uterus by a health care professional. Talk to your pediatrician regarding the use of this medicine in  children. Special care may be needed. Overdosage: If you think you have taken too much of this medicine contact a poison control center or emergency room at once. NOTE: This medicine is only for you. Do not share this medicine with others. What if I miss a dose? This does not apply. Depending on the brand of device you have inserted, the device will need to be replaced every 3 to 5 years if you wish to continue using this type of birth control. What may interact with this medicine? Do not take this medicine with any of the following medications: -amprenavir -bosentan -fosamprenavir This medicine may also interact with the following medications: -aprepitant -armodafinil -barbiturate medicines for inducing sleep or treating seizures -bexarotene -boceprevir -griseofulvin -medicines to treat seizures like carbamazepine, ethotoin, felbamate, oxcarbazepine, phenytoin, topiramate -modafinil -pioglitazone -rifabutin -rifampin -rifapentine -some medicines to treat HIV infection like atazanavir, efavirenz, indinavir, lopinavir, nelfinavir, tipranavir, ritonavir -St. John's wort -warfarin This list may not describe all possible interactions. Give your health care provider a list of all the medicines, herbs, non-prescription drugs, or dietary supplements you use. Also tell them if you smoke, drink alcohol, or use illegal drugs. Some items may interact with your medicine. What should I watch for while using this medicine? Visit your doctor or health care professional for regular check ups. See your doctor if you or your partner has sexual contact with others, becomes HIV positive, or gets a sexual transmitted disease. This product does not protect you against HIV infection (AIDS) or other sexually transmitted diseases. You can check the placement of the IUD yourself by reaching up to the top of your vagina with clean fingers   to feel the threads. Do not pull on the threads. It is a good habit to  check placement after each menstrual period. Call your doctor right away if you feel more of the IUD than just the threads or if you cannot feel the threads at all. The IUD may come out by itself. You may become pregnant if the device comes out. If you notice that the IUD has come out use a backup birth control method like condoms and call your health care provider. Using tampons will not change the position of the IUD and are okay to use during your period. This IUD can be safely scanned with magnetic resonance imaging (MRI) only under specific conditions. Before you have an MRI, tell your healthcare provider that you have an IUD in place, and which type of IUD you have in place. What side effects may I notice from receiving this medicine? Side effects that you should report to your doctor or health care professional as soon as possible: -allergic reactions like skin rash, itching or hives, swelling of the face, lips, or tongue -fever, flu-like symptoms -genital sores -high blood pressure -no menstrual period for 6 weeks during use -pain, swelling, warmth in the leg -pelvic pain or tenderness -severe or sudden headache -signs of pregnancy -stomach cramping -sudden shortness of breath -trouble with balance, talking, or walking -unusual vaginal bleeding, discharge -yellowing of the eyes or skin Side effects that usually do not require medical attention (report to your doctor or health care professional if they continue or are bothersome): -acne -breast pain -change in sex drive or performance -changes in weight -cramping, dizziness, or faintness while the device is being inserted -headache -irregular menstrual bleeding within first 3 to 6 months of use -nausea This list may not describe all possible side effects. Call your doctor for medical advice about side effects. You may report side effects to FDA at 1-800-FDA-1088. Where should I keep my medicine? This does not apply. NOTE: This  sheet is a summary. It may not cover all possible information. If you have questions about this medicine, talk to your doctor, pharmacist, or health care provider.  2019 Elsevier/Gold Standard (2015-11-04 14:14:56)

## 2018-02-14 LAB — TSH: TSH: 3.27 u[IU]/mL (ref 0.450–4.500)

## 2018-02-25 ENCOUNTER — Other Ambulatory Visit: Payer: Medicaid Other

## 2018-02-25 ENCOUNTER — Encounter: Payer: Self-pay | Admitting: Women's Health

## 2018-02-25 ENCOUNTER — Ambulatory Visit (INDEPENDENT_AMBULATORY_CARE_PROVIDER_SITE_OTHER): Payer: Medicaid Other | Admitting: Women's Health

## 2018-02-25 VITALS — BP 121/62 | HR 82 | Ht 62.0 in | Wt 186.0 lb

## 2018-02-25 DIAGNOSIS — Z30430 Encounter for insertion of intrauterine contraceptive device: Secondary | ICD-10-CM

## 2018-02-25 LAB — BETA HCG QUANT (REF LAB): hCG Quant: <1 m[IU]/mL

## 2018-02-25 MED ORDER — LEVONORGESTREL 19.5 MCG/DAY IU IUD
INTRAUTERINE_SYSTEM | Freq: Once | INTRAUTERINE | Status: AC
  Administered 2018-02-25: 1 via INTRAUTERINE

## 2018-02-25 NOTE — Addendum Note (Signed)
Addended by: Sherre LainASH, Kelleen Stolze A on: 02/25/2018 08:24 AM   Modules accepted: Orders

## 2018-02-25 NOTE — Progress Notes (Signed)
   IUD INSERTION Patient name: Nicole Freeman MRN [id number]  Date of birth: [birth year] Subjective Findings:   Nicole Freeman is a 22 y.o. G62P2002 Caucasian female being seen today for insertion of a Liletta IUD.   Patient's last menstrual period was 02/14/2018 (exact date). Last sexual intercourse was 1/7 Last pap 05/28/17 at [facility]. Results were:  normal  The risks and benefits of the method and placement have been thouroughly reviewed with the patient and all questions were answered.  Specifically the patient is aware of failure rate of 02/998, expulsion of the IUD and of possible perforation.  The patient is aware of irregular bleeding due to the method and understands the incidence of irregular bleeding diminishes with time.  Signed copy of informed consent in chart.  Pertinent History Reviewed:   Reviewed past medical,surgical, social, obstetrical and family history.  Reviewed problem list, medications and allergies. Objective Findings & Procedure:   Vitals:   02/25/18 1341  BP: 121/62  Pulse: 82  Weight: 186 lb (84.4 kg)  Height: 5\' 2"  (1.575 m)  Body mass index is 34.02 kg/m.  Results for orders placed or performed in visit on 02/25/18 (from the past 24 hour(s))  HCG, Tumor Marker   Collection Time: 02/25/18  8:51 AM  Result Value Ref Range   hCG Quant <1 mIU/mL     Time out was performed.  A graves speculum was placed in the vagina.  The cervix was visualized, prepped using Betadine, and grasped with a single tooth tenaculum. The uterus was found to be retroflexed and it sounded to 9 cm. Cx very anterior.  Liletta IUD placed per manufacturer's recommendations. The strings were trimmed to approximately 3 cm. The patient tolerated the procedure well.   Informal transvaginal sonogram was performed and the proper placement of the IUD was verified. Assessment & Plan:   1) Liletta IUD insertion The patient was given post procedure instructions, including signs and  symptoms of infection and to check for the strings after each menses or each month, and refraining from intercourse or anything in the vagina for 3 days. She was given a Liletta care card with date IUD placed, and date IUD to be removed. She is scheduled for a f/u appointment in 4 weeks.  No orders of the defined types were placed in this encounter.   Return in about 4 weeks (around 03/25/2018) for F/U.  Cheral Marker CNM, [facility] 02/25/2018 2:13 PM

## 2018-02-25 NOTE — Addendum Note (Signed)
Addended by: Sherre Lain A on: 02/25/2018 04:25 PM   Modules accepted: Orders

## 2018-02-25 NOTE — Patient Instructions (Signed)
 Nothing in vagina for 3 days (no sex, douching, tampons, etc...)  Check your strings once a month to make sure you can feel them, if you are not able to please let us know  If you develop a fever of 100.4 or more in the next few weeks, or if you develop severe abdominal pain, please let us know  Use a backup method of birth control, such as condoms, for 2 weeks    Intrauterine Device Insertion, Care After  This sheet gives you information about how to care for yourself after your procedure. Your health care provider may also give you more specific instructions. If you have problems or questions, contact your health care provider. What can I expect after the procedure? After the procedure, it is common to have:  Cramps and pain in the abdomen.  Light bleeding (spotting) or heavier bleeding that is like your menstrual period. This may last for up to a few days.  Lower back pain.  Dizziness.  Headaches.  Nausea. Follow these instructions at home:  Before resuming sexual activity, check to make sure that you can feel the IUD string(s). You should be able to feel the end of the string(s) below the opening of your cervix. If your IUD string is in place, you may resume sexual activity. ? If you had a hormonal IUD inserted more than 7 days after your most recent period started, you will need to use a backup method of birth control for 7 days after IUD insertion. Ask your health care provider whether this applies to you.  Continue to check that the IUD is still in place by feeling for the string(s) after every menstrual period, or once a month.  Take over-the-counter and prescription medicines only as told by your health care provider.  Do not drive or use heavy machinery while taking prescription pain medicine.  Keep all follow-up visits as told by your health care provider. This is important. Contact a health care provider if:  You have bleeding that is heavier or lasts longer than  a normal menstrual cycle.  You have a fever.  You have cramps or abdominal pain that get worse or do not get better with medicine.  You develop abdominal pain that is new or is not in the same area of earlier cramping and pain.  You feel lightheaded or weak.  You have abnormal or bad-smelling discharge from your vagina.  You have pain during sexual activity.  You have any of the following problems with your IUD string(s): ? The string bothers or hurts you or your sexual partner. ? You cannot feel the string. ? The string has gotten longer.  You can feel the IUD in your vagina.  You think you may be pregnant, or you miss your menstrual period.  You think you may have an STI (sexually transmitted infection). Get help right away if:  You have flu-like symptoms.  You have a fever and chills.  You can feel that your IUD has slipped out of place. Summary  After the procedure, it is common to have cramps and pain in the abdomen. It is also common to have light bleeding (spotting) or heavier bleeding that is like your menstrual period.  Continue to check that the IUD is still in place by feeling for the string(s) after every menstrual period, or once a month.  Keep all follow-up visits as told by your health care provider. This is important.  Contact your health care provider if   you have problems with your IUD string(s), such as the string getting longer or bothering you or your sexual partner. This information is not intended to replace advice given to you by your health care provider. Make sure you discuss any questions you have with your health care provider. Document Released: 09/20/2010 Document Revised: 12/14/2015 Document Reviewed: 12/14/2015 Elsevier Interactive Patient Education  2019 Elsevier Inc.  Levonorgestrel intrauterine device (IUD) What is this medicine? LEVONORGESTREL IUD (LEE voe nor jes trel) is a contraceptive (birth control) device. The device is placed  inside the uterus by a healthcare professional. It is used to prevent pregnancy. This device can also be used to treat heavy bleeding that occurs during your period. This medicine may be used for other purposes; ask your health care provider or pharmacist if you have questions. COMMON BRAND NAME(S): Kyleena, LILETTA, Mirena, Skyla What should I tell my health care provider before I take this medicine? They need to know if you have any of these conditions: -abnormal Pap smear -cancer of the breast, uterus, or cervix -diabetes -endometritis -genital or pelvic infection now or in the past -have more than one sexual partner or your partner has more than one partner -heart disease -history of an ectopic or tubal pregnancy -immune system problems -IUD in place -liver disease or tumor -problems with blood clots or take blood-thinners -seizures -use intravenous drugs -uterus of unusual shape -vaginal bleeding that has not been explained -an unusual or allergic reaction to levonorgestrel, other hormones, silicone, or polyethylene, medicines, foods, dyes, or preservatives -pregnant or trying to get pregnant -breast-feeding How should I use this medicine? This device is placed inside the uterus by a health care professional. Talk to your pediatrician regarding the use of this medicine in children. Special care may be needed. Overdosage: If you think you have taken too much of this medicine contact a poison control center or emergency room at once. NOTE: This medicine is only for you. Do not share this medicine with others. What if I miss a dose? This does not apply. Depending on the brand of device you have inserted, the device will need to be replaced every 3 to 5 years if you wish to continue using this type of birth control. What may interact with this medicine? Do not take this medicine with any of the following medications: -amprenavir -bosentan -fosamprenavir This medicine may also  interact with the following medications: -aprepitant -armodafinil -barbiturate medicines for inducing sleep or treating seizures -bexarotene -boceprevir -griseofulvin -medicines to treat seizures like carbamazepine, ethotoin, felbamate, oxcarbazepine, phenytoin, topiramate -modafinil -pioglitazone -rifabutin -rifampin -rifapentine -some medicines to treat HIV infection like atazanavir, efavirenz, indinavir, lopinavir, nelfinavir, tipranavir, ritonavir -St. John's wort -warfarin This list may not describe all possible interactions. Give your health care provider a list of all the medicines, herbs, non-prescription drugs, or dietary supplements you use. Also tell them if you smoke, drink alcohol, or use illegal drugs. Some items may interact with your medicine. What should I watch for while using this medicine? Visit your doctor or health care professional for regular check ups. See your doctor if you or your partner has sexual contact with others, becomes HIV positive, or gets a sexual transmitted disease. This product does not protect you against HIV infection (AIDS) or other sexually transmitted diseases. You can check the placement of the IUD yourself by reaching up to the top of your vagina with clean fingers to feel the threads. Do not pull on the threads. It is a good habit   to check placement after each menstrual period. Call your doctor right away if you feel more of the IUD than just the threads or if you cannot feel the threads at all. The IUD may come out by itself. You may become pregnant if the device comes out. If you notice that the IUD has come out use a backup birth control method like condoms and call your health care provider. Using tampons will not change the position of the IUD and are okay to use during your period. This IUD can be safely scanned with magnetic resonance imaging (MRI) only under specific conditions. Before you have an MRI, tell your healthcare provider that  you have an IUD in place, and which type of IUD you have in place. What side effects may I notice from receiving this medicine? Side effects that you should report to your doctor or health care professional as soon as possible: -allergic reactions like skin rash, itching or hives, swelling of the face, lips, or tongue -fever, flu-like symptoms -genital sores -high blood pressure -no menstrual period for 6 weeks during use -pain, swelling, warmth in the leg -pelvic pain or tenderness -severe or sudden headache -signs of pregnancy -stomach cramping -sudden shortness of breath -trouble with balance, talking, or walking -unusual vaginal bleeding, discharge -yellowing of the eyes or skin Side effects that usually do not require medical attention (report to your doctor or health care professional if they continue or are bothersome): -acne -breast pain -change in sex drive or performance -changes in weight -cramping, dizziness, or faintness while the device is being inserted -headache -irregular menstrual bleeding within first 3 to 6 months of use -nausea This list may not describe all possible side effects. Call your doctor for medical advice about side effects. You may report side effects to FDA at 1-800-FDA-1088. Where should I keep my medicine? This does not apply. NOTE: This sheet is a summary. It may not cover all possible information. If you have questions about this medicine, talk to your doctor, pharmacist, or health care provider.  2019 Elsevier/Gold Standard (2015-11-04 14:14:56)  

## 2018-03-25 ENCOUNTER — Ambulatory Visit: Payer: Medicaid Other | Admitting: Women's Health

## 2018-03-25 ENCOUNTER — Encounter: Payer: Self-pay | Admitting: Women's Health

## 2018-03-25 VITALS — BP 118/73 | HR 71 | Ht 63.0 in | Wt 189.8 lb

## 2018-03-25 DIAGNOSIS — Z30431 Encounter for routine checking of intrauterine contraceptive device: Secondary | ICD-10-CM

## 2018-03-25 NOTE — Progress Notes (Signed)
   GYN VISIT Patient name: Nicole Freeman MRN [id number]  Date of birth: Nov 21, 1995 Chief Complaint:   Follow-up (IUD check)  History of Present Illness:   Nicole Freeman is a 23 y.o. G50P2002 Caucasian female being seen today for IUD check. Liletta IUD inserted 02/25/18. No problems, able to feel strings, no pain w/ sex. Periods lighter.   No LMP recorded. The current method of family planning is IUD. Last pap 05/28/17. Results were:  normal Review of Systems:   Pertinent items are noted in HPI Denies fever/chills, dizziness, headaches, visual disturbances, fatigue, shortness of breath, chest pain, abdominal pain, vomiting, abnormal vaginal discharge/itching/odor/irritation, problems with periods, bowel movements, urination, or intercourse unless otherwise stated above.  Pertinent History Reviewed:  Reviewed past medical,surgical, social, obstetrical and family history.  Reviewed problem list, medications and allergies. Physical Assessment:   Vitals:   03/25/18 1144  BP: 118/73  Pulse: 71  Weight: 189 lb 12.8 oz (86.1 kg)  Height: 5\' 3"  (1.6 m)  Body mass index is 33.62 kg/m.       Physical Examination:   General appearance: alert, well appearing, and in no distress  Mental status: alert, oriented to person, place, and time  Skin: warm & dry   Cardiovascular: normal heart rate noted  Respiratory: normal respiratory effort, no distress  Abdomen: soft, non-tender   Pelvic: VULVA: normal appearing vulva with no masses, tenderness or lesions, VAGINA: normal appearing vagina with normal color and discharge, no lesions, CERVIX: normal appearing cervix without discharge or lesions, IUD strings visible and appropriate length  Extremities: no edema   No results found for this or any previous visit (from the past 24 hour(s)).  Assessment & Plan:  1) IUD check>  In place  Meds: No orders of the defined types were placed in this encounter.   No orders of the defined types were placed  in this encounter.   Return in about 1 year (around 03/26/2019) for Physical.  Cheral Marker CNM, [facility] 03/25/2018 12:12 PM

## 2018-05-12 ENCOUNTER — Encounter: Payer: Self-pay | Admitting: Adult Health

## 2018-05-12 ENCOUNTER — Other Ambulatory Visit: Payer: Self-pay

## 2018-05-12 ENCOUNTER — Ambulatory Visit (INDEPENDENT_AMBULATORY_CARE_PROVIDER_SITE_OTHER): Payer: Medicaid Other | Admitting: Adult Health

## 2018-05-12 ENCOUNTER — Telehealth: Payer: Self-pay | Admitting: *Deleted

## 2018-05-12 ENCOUNTER — Telehealth: Payer: Self-pay | Admitting: Women's Health

## 2018-05-12 VITALS — BP 125/84 | HR 105 | Temp 98.4°F | Ht 63.0 in | Wt 188.0 lb

## 2018-05-12 DIAGNOSIS — L239 Allergic contact dermatitis, unspecified cause: Secondary | ICD-10-CM

## 2018-05-12 MED ORDER — PREDNISONE 20 MG PO TABS: ORAL_TABLET | ORAL | 0 refills | Status: DC

## 2018-05-12 NOTE — Telephone Encounter (Signed)
Pt states that a few weeks ago her husband had a rash from poison [place]. She states that last week she noticed a rash on her arm and now she has noticed a rash underneath her left breast. She states that she has had some itching at her nipple and that it is swollen to twice the size it normally is. Pt denies fever. She does c/o headache. She denies coming in contact with anyone who in the last month has been confirmed or suspected of having  covid-19. She denies cough, SOB, muscle pain, diarrhea, vomiting, abdominal pain, red eye, weakness, bruising, bleeding, joint pain and severe headache. Pt does state that she has had a headache. Pt given an appt to be evaluated by a provider, visit approved by Dr Despina Hidden.

## 2018-05-12 NOTE — Telephone Encounter (Signed)
Please call pt she is breast feeding and has rash on left breast and nipple is swollen.

## 2018-05-12 NOTE — Progress Notes (Signed)
Patient ID: Nicole Freeman, female   DOB: 1995/03/12, 23 y.o.   MRN: [id number] History of Present Illness: Nicole Freeman is a 23 year old white female, divorced, G2P2, breastfeeding, in complaining of rash on left breast and arm and it itches.Left nipple is swollen.    Current Medications, Allergies, Past Medical History, Past Surgical History, Family History and Social History were reviewed in [employer] record.     Review of Systems: Has red rash on left breat and left inner arm, and it itches, has noticed for 4 days Left nipple is swollen  Partner has had poison oak, and she did his laundry   Physical Exam:BP 125/84 (BP Location: Left Arm, Patient Position: Sitting, Cuff Size: Normal)   Pulse (!) 105   Temp 98.4 F (36.9 C)   Ht 5\' 3"  (1.6 m)   Wt 188 lb (85.3 kg)   LMP 05/08/2018   Breastfeeding Yes   BMI 33.30 kg/m  General:  Well developed, well nourished, no acute distress Skin:  Warm and dry Breast:  No dominant palpable mass, retraction, or nipple discharge on right, on left, the nipple is slightly swollen and red, and below the breast on chest there is erythema with distinct margins and small vesicles,no mass,retraction or nipple discharge. Left arm has erythema patches with small vesicles,Dr Eure in for co exam.  Psych:  No mood changes, alert and cooperative,seems happy Fall risk is low. PHQ 9 score 0.   Impression: 1. Allergic contact dermatitis, unspecified trigger     Will Rx Prednisone 40 mg x 10 days     Plan: Meds ordered this encounter  Medications  . predniSONE (DELTASONE) 20 MG tablet    Sig: Take 2 daily with food    Dispense:  20 tablet    Refill:  0    Order Specific Question:   Supervising Provider    Answer:   Lazaro Arms [[id number]]  F/U prn  Ok to use Zanfil per Dr Despina Hidden, make sure to wash off breast good before breast feeding

## 2019-01-13 ENCOUNTER — Other Ambulatory Visit: Payer: Self-pay

## 2019-01-13 DIAGNOSIS — Z20822 Contact with and (suspected) exposure to covid-19: Secondary | ICD-10-CM

## 2019-01-14 ENCOUNTER — Telehealth: Payer: Self-pay | Admitting: *Deleted

## 2019-01-14 NOTE — Telephone Encounter (Signed)
Requesting [phone] results. None available at this time. Please check MyChart for your results over the next 1-2 days.

## 2019-01-15 LAB — NOVEL CORONAVIRUS, NAA: SARS-CoV-2, NAA: DETECTED — AB

## 2019-01-15 NOTE — Telephone Encounter (Signed)
Pt called to obtain COVID result; explained results are not available; informed pt results will be available first in MyChart, and she will be called regarding her result; pt encouraged to answer all calls; she verbalized understanding.

## 2019-02-21 ENCOUNTER — Emergency Department (HOSPITAL_COMMUNITY): Payer: Medicaid Other

## 2019-02-21 ENCOUNTER — Emergency Department (HOSPITAL_COMMUNITY)
Admission: EM | Admit: 2019-02-21 | Discharge: 2019-02-21 | Disposition: A | Discharge status: Home / Self Care | Payer: Medicaid Other | Attending: Emergency Medicine | Admitting: Emergency Medicine

## 2019-02-21 ENCOUNTER — Other Ambulatory Visit: Payer: Self-pay

## 2019-02-21 DIAGNOSIS — Z975 Presence of (intrauterine) contraceptive device: Secondary | ICD-10-CM | POA: Diagnosis not present

## 2019-02-21 DIAGNOSIS — Z87891 Personal history of nicotine dependence: Secondary | ICD-10-CM | POA: Diagnosis not present

## 2019-02-21 DIAGNOSIS — N83209 Unspecified ovarian cyst, unspecified side: Secondary | ICD-10-CM

## 2019-02-21 DIAGNOSIS — R102 Pelvic and perineal pain: Secondary | ICD-10-CM | POA: Diagnosis not present

## 2019-02-21 DIAGNOSIS — R109 Unspecified abdominal pain: Secondary | ICD-10-CM | POA: Diagnosis not present

## 2019-02-21 DIAGNOSIS — Z79899 Other long term (current) drug therapy: Secondary | ICD-10-CM | POA: Diagnosis not present

## 2019-02-21 DIAGNOSIS — R52 Pain, unspecified: Secondary | ICD-10-CM

## 2019-02-21 DIAGNOSIS — N83291 Other ovarian cyst, right side: Secondary | ICD-10-CM | POA: Diagnosis not present

## 2019-02-21 LAB — URINALYSIS, ROUTINE W REFLEX MICROSCOPIC
Bilirubin Urine: NEGATIVE
Glucose, UA: NEGATIVE mg/dL
Hgb urine dipstick: NEGATIVE
Ketones, ur: NEGATIVE mg/dL
Leukocytes,Ua: NEGATIVE
Nitrite: NEGATIVE
Protein, ur: NEGATIVE mg/dL
Specific Gravity, Urine: 1.013 (ref 1.005–1.030)
pH: 6 (ref 5.0–8.0)

## 2019-02-21 LAB — POC URINE PREG, ED: Preg Test, Ur: NEGATIVE

## 2019-02-21 MED ORDER — ACETAMINOPHEN 500 MG PO TABS
1000.0000 mg | ORAL_TABLET | Freq: Once | ORAL | Status: AC
  Administered 2019-02-21: 1000 mg via ORAL
  Filled 2019-02-21: qty 2

## 2019-02-21 MED ORDER — ONDANSETRON 8 MG PO TBDP
8.0000 mg | ORAL_TABLET | Freq: Once | ORAL | Status: AC
  Administered 2019-02-21: 8 mg via ORAL
  Filled 2019-02-21: qty 1

## 2019-02-21 MED ORDER — KETOROLAC TROMETHAMINE 30 MG/ML IJ SOLN
30.0000 mg | Freq: Once | INTRAMUSCULAR | Status: AC
  Administered 2019-02-21: 07:00:00 30 mg via INTRAMUSCULAR
  Filled 2019-02-21: qty 1

## 2019-02-21 NOTE — ED Provider Notes (Signed)
MSE was initiated and I personally evaluated the patient and placed orders (if any) at  6:15 AM on February 21, 2019.  The patient appears stable so that the remainder of the MSE may be completed by another provider.  Patient reports she had right flank pain a few days ago.  Tonight at 11 PM she had acute onset of pain from her umbilicus that radiates down to her bladder but feels a pressure sensation.  Her pain appears to be more on the right side than the left.  She states she is unable to get comfortable and has to keep changing positions.  She denies hematuria or dysuria.  She states earlier she felt like she could not urinate but she was finally able to produce a urine sample.  She denies fever, but did have nausea before the pain started without vomiting.  She denies any family history of kidney stones but states they get frequent urinary tract infections.  Patient has IUD  Patient seems uncomfortable, she is sitting up holding her right lower abdomen and sort of rocking in the bed.  She does not have CVA tenderness to exam now.  She has some mild diffuse tenderness in her lower abdomen.  With patient's history I suspect she may be passing a kidney stone or she could have some other cause for her pain.  She does not have a urinary tract infection and also there is no blood that would suggest a renal stone.  She could be early appendicitis however she seems to be moving too much for that.  She was given Toradol IM with Zofran ODT.  CT renal was done.  Results for orders placed or performed during the hospital encounter of 02/21/19  Urinalysis, Routine w reflex microscopic  Result Value Ref Range   Color, Urine YELLOW YELLOW   APPearance CLEAR CLEAR   Specific Gravity, Urine 1.013 1.005 - 1.030   pH 6.0 5.0 - 8.0   Glucose, UA NEGATIVE NEGATIVE mg/dL   Hgb urine dipstick NEGATIVE NEGATIVE   Bilirubin Urine NEGATIVE NEGATIVE   Ketones, ur NEGATIVE NEGATIVE mg/dL   Protein, ur NEGATIVE  NEGATIVE mg/dL   Nitrite NEGATIVE NEGATIVE   Leukocytes,Ua NEGATIVE NEGATIVE  POC Urine Pregnancy, ED (not at [facility])  Result Value Ref Range   Preg Test, Ur NEGATIVE NEGATIVE       Devoria Albe, MD 02/21/19 [phone]

## 2019-02-21 NOTE — Discharge Instructions (Signed)
Please be sure to follow up with OB/GYN. If your pain worsens and does not respond to Tylenol and Ibuprofen, if you experience dizziness, extreme fatigue, or feel like you may pass out seek care immediately.

## 2019-02-21 NOTE — ED Triage Notes (Signed)
Pt reports lower abd/pelvic pain since yesterday, worse this am.  Pt also had some right flank pain yesterday.  Pt reports pressure type feeling.

## 2019-02-21 NOTE — ED Provider Notes (Signed)
[facility] EMERGENCY DEPARTMENT Provider Note   CSN: [id number] Arrival date & time: 02/21/19  0341     History Chief Complaint  Patient presents with  . Abdominal Pain    Nicole Freeman is a 24 y.o. female.  HPI [name] is a 23y/o female with PMH of Von Willebrand disease with IUD in place who presents for new onset left sided flank pain radiating to her groin. It began late last night and the patient thought it was just gas. However, her pain continued and began to feel sharp and stabbing and began radiating to her groin. She tried using the bathroom to urinate and felt a lot of pressure "like I was going into labor again. Her pain is intermittent and occurring multiple times over the last 12 hours and lasting several minutes per episode. She has never had anything like this before. Patient did have COVID19 infection in early December but states she has fully recovered.     Past Medical History:  Diagnosis Date  . Von Willebrand disease [facility])     Patient Active Problem List   Diagnosis Date Noted  . Allergic contact dermatitis 05/12/2018  . Encounter for IUD insertion 02/25/2018  . Tobacco use 05/28/2017  . Von Willebrand disease (HCC) 05/28/2017    Past Surgical History:  Procedure Laterality Date  . WISDOM TOOTH EXTRACTION       OB History    Gravida  2   Para  2   Term  2   Preterm      AB      Living  2     SAB      TAB      Ectopic      Multiple  0   Live Births  2           Family History  Problem Relation Age of Onset  . Breast cancer Paternal Aunt   . Lung cancer Paternal Aunt     Social History   Tobacco Use  . Smoking status: Former Smoker    Packs/day: 0.50  . Smokeless tobacco: Never Used  . Tobacco comment: quit April 2019  Substance Use Topics  . Alcohol use: Yes  . Drug use: No    Home Medications Prior to Admission medications   Medication Sig Start Date End Date Taking?  Authorizing Provider  ibuprofen (ADVIL,MOTRIN) 600 MG tablet Take 1 tablet (600 mg total) by mouth every 6 (six) hours. Patient not taking: Reported on 05/12/2018 01/09/18   Peiffer, Shawn Route, MD  Levonorgestrel (LILETTA, 52 MG,) 19.5 MCG/DAY IUD IUD 1 each by Intrauterine route once.    [provider]  predniSONE (DELTASONE) 20 MG tablet Take 2 daily with food 05/12/18   Cyril Mourning A, NP  Prenat w/o A-[id number]-[id number]-DHA (CITRANATAL 90 DHA) 90-1 & 300 MG MISC Take 1 each by mouth daily.  11/29/17   [provider]    Allergies    Patient has no known allergies.  Review of Systems   Review of Systems  Constitutional: Negative for activity change, chills, fatigue and fever.  HENT: Negative for congestion, rhinorrhea and sneezing.   Respiratory: Negative for cough, chest tightness, shortness of breath and wheezing.   Cardiovascular: Negative for chest pain, palpitations and leg swelling.  Gastrointestinal: Positive for nausea. Negative for abdominal distention, abdominal pain, constipation, diarrhea and vomiting.  Genitourinary: Positive for difficulty urinating and flank pain. Negative for dysuria, frequency and urgency.    Physical  Exam Updated Vital Signs BP 111/80 (BP Location: Right Arm)   Pulse 98   Temp 98.5 F (36.9 C) (Oral)   Resp 18   Ht 5\' 3"  (1.6 m)   Wt 94.3 kg   LMP 02/08/2019 (Approximate)   SpO2 98%   BMI 36.85 kg/m   Physical Exam Constitutional:      General: She is not in acute distress.    Appearance: She is not toxic-appearing.  Eyes:     Extraocular Movements: Extraocular movements intact.     Pupils: Pupils are equal, round, and reactive to light.  Cardiovascular:     Rate and Rhythm: Normal rate and regular rhythm.     Heart sounds: Normal heart sounds. No murmur.  Pulmonary:     Effort: Pulmonary effort is normal.     Breath sounds: Normal breath sounds.  Abdominal:     General: Abdomen is flat. Bowel sounds are normal. There  is no distension.     Palpations: Abdomen is soft.     Tenderness: There is abdominal tenderness in the right lower quadrant. There is right CVA tenderness. There is no left CVA tenderness or guarding.  Skin:    General: Skin is warm and dry.     Coloration: Skin is not cyanotic.     Findings: No erythema or rash.  Neurological:     Mental Status: She is alert.     ED Results / Procedures / Treatments   Labs (all labs ordered are listed, but only abnormal results are displayed) Labs Reviewed  URINALYSIS, ROUTINE W REFLEX MICROSCOPIC  POC URINE PREG, ED    EKG None  Radiology 04/08/2019 PELVIC DOPPLER (TORSION R/O OR MASS ARTERIAL FLOW)  Result Date: 02/21/2019 CLINICAL DATA:  Right-sided pelvic pain for 1 day. Followup abnormal CT scan. EXAM: TRANSABDOMINAL AND TRANSVAGINAL ULTRASOUND OF PELVIS DOPPLER ULTRASOUND OF OVARIES TECHNIQUE: Both transabdominal and transvaginal ultrasound examinations of the pelvis were performed. Transabdominal technique was performed for global imaging of the pelvis including uterus, ovaries, adnexal regions, and pelvic cul-de-sac. It was necessary to proceed with endovaginal exam following the transabdominal exam to visualize the ovaries and endometrium. Color and duplex Doppler ultrasound was utilized to evaluate blood flow to the ovaries. COMPARISON:  CT scan 02/21/2019 FINDINGS: Uterus Measurements: 9.5 x 5.1 x 5.2 cm = volume: 131 mL. No fibroids or other mass visualized. Endometrium Thickness: 4 mm.  IUD noted in the endometrial canal. Right ovary Measurements: 7.8 x 5.5 x 7.2 cm = volume: 164 mL. Large complex multicystic and probably hemorrhagic lesion associated with the right ovary without internal blood flow. Left ovary Measurements: 2.6 x 1.2 x 2.5 cm = volume: 4.4 mL. Normal appearance/no adnexal mass. Pulsed Doppler evaluation of both ovaries demonstrates normal low-resistance arterial and venous waveforms. Other findings Moderate to large complex fluid,  likely hematoma. IMPRESSION: 1. Unremarkable sonographic appearance of the uterus. IUD noted in the endometrial canal. 2. Large complex multi cystic and probably hemorrhagic lesion associated with the right ovary with associated moderate to large amount of free pelvic fluid/hemorrhage. 3. No definite findings for ovarian torsion. The ovarian tissue around the cystic lesion appears to have arterial and venous blood flow. Electronically Signed   By: 02/23/2019 M.D.   On: 02/21/2019 10:09   CT Renal Stone Study  Result Date: 02/21/2019 CLINICAL DATA:  Flank pain.  Concern for renal obstruction EXAM: CT ABDOMEN AND PELVIS WITHOUT CONTRAST TECHNIQUE: Multidetector CT imaging of the abdomen and pelvis was performed following  the standard protocol without IV contrast. COMPARISON:  CT abdomen 08/11/2016 FINDINGS: Lower chest: Lung bases are clear. Hepatobiliary: No focal hepatic lesion. No biliary duct dilatation. Gallbladder is normal. Common bile duct is normal. Pancreas: Pancreas is normal. No ductal dilatation. No pancreatic inflammation. Spleen: Normal spleen Adrenals/urinary tract: Adrenal glands and kidneys are normal. The ureters and bladder normal. Stomach/Bowel: Stomach, small bowel cecum normal. Appendix is partially imaged on image 65/2 and similar to comparison CT exam. There is free fluid inferior to the cecal tip along the RIGHT iliac fossa related to ovarian process. The ascending, transverse and descending colon normal. Vascular/Lymphatic: Abdominal aorta is normal caliber. No periportal or retroperitoneal adenopathy. No pelvic adenopathy. Reproductive: IUD expected location within the uterus. The RIGHT adnexa is expanded by an ovoid mass measuring 8.7 x 4.8 by 6.9 cm (image 74/2. Centrally within this mass like expansion is a low-density round cystic lesion measuring 3.8 by 4.3 cm. The ovoid expansion peripheral to this cystic lesion is high-density consistent with hemorrhage/hematoma (HU equal 54).  Small volume high-density free fluid in the posterior cul-de-sac. High-density fluid extends into the RIGHT iliac fossa. Findings are most suggestive of rupture RIGHT ovarian cysts with hematoma in the RIGHT adnexa. Cannot exclude RIGHT ovarian torsion with RIGHT adnexal mass. The LEFT ovary appears normal. Musculoskeletal: No aggressive osseous lesion. IMPRESSION: 1. Hemorrhagic mass within the RIGHT adnexa is favored hematoma related to ruptured RIGHT ovarian cyst. As normal ovary is not identified, cannot exclude hemorrhage related to ovarian torsion. Recommend pelvic ultrasound with Doppler to exclude less likely RIGHT ovarian torsion. 2. Small volume hemoperitoneum in the posterior cul-de-sac and RIGHT iliac fossa. 3. Uterus and LEFT ovary appear normal.  IUD expected location. 4. Normal appendix partially imaged. 5. No ureteral obstruction. Findings conveyed toZammitt, MDon 02/21/2019  at08:07. Electronically Signed   By: Genevive Bi M.D.   On: 02/21/2019 08:07    Procedures Procedures (including critical care time)  Medications Ordered in ED Medications  acetaminophen (TYLENOL) tablet 1,000 mg (has no administration in time range)  ketorolac (TORADOL) 30 MG/ML injection 30 mg (30 mg Intramuscular Given 02/21/19 0707)  ondansetron (ZOFRAN-ODT) disintegrating tablet 8 mg (8 mg Oral Given 02/21/19 2130)    ED Course  I have reviewed the triage vital signs and the nursing notes.  Pertinent labs & imaging results that were available during my care of the patient were reviewed by me and considered in my medical decision making (see chart for details).  Ms Latondra Gebhart is a 23y/o female with PMH of Von Willebrand disease and IUD placement. She was seen and evaluated for new onset right sided flank pain that radiated to her groin with associated urinary difficulty and nausea. Her work up was significant for a negative urine pregnancy test and a normal U/A. Her CT renal stone study  showed a no ureteral obstruction and no hydronephrosis. It did show a hemorrhagic mass within the right adnexa and no normal right ovary could be identified on CT. It was advised to get a pelvic doppler ultrasound to exclude an ovarian mass. Left ovary was normal. Appendix was partially visualized but no signs of appendicits were seen. Pelvic doppler ultrasound was significant for large complex multicystic and probably hemorrhagic lesion with the right ovary with associated free pelvic fluid and hemorrhage. The ovarian tissue around the cystic lesion appears to have arterial and venous blood flow.  Patient was given 1g of Tylenol before she left he ED to help with pain control and  instructed to take Ibuprofen (up to 600mg ) and Tylenol (up to 500mg ) together every 6 hours as needed. Her vitals remained stable while in the ED and she was safe to be discharged home with instructions to follow up with OB here in [place] with Dr. Elonda Husky.   MDM Rules/Calculators/A&P                     Gilman was evaluated in Emergency Department on 02/21/2019 for the symptoms described in the history of present illness. She was evaluated in the context of the global COVID-19 pandemic, which necessitated consideration that the patient might be at risk for infection with the SARS-CoV-2 virus that causes COVID-19. Institutional protocols and algorithms that pertain to the evaluation of patients at risk for COVID-19 are in a state of rapid change based on information released by regulatory bodies including the CDC and federal and state organizations. These policies and algorithms were followed during the patient's care in the ED.   Final Clinical Impression(s) / ED Diagnoses Final diagnoses:  None    Rx / DC Orders ED Discharge Orders    None       Nuala Alpha, DO 02/21/19 1046    Elnora Morrison, MD 02/23/19 1258

## 2019-02-23 ENCOUNTER — Telehealth: Payer: Self-pay | Admitting: *Deleted

## 2019-02-23 ENCOUNTER — Telehealth: Payer: Self-pay | Admitting: Obstetrics & Gynecology

## 2019-02-23 MED ORDER — HYDROCODONE-ACETAMINOPHEN 5-325 MG PO TABS: 1.0000 | ORAL_TABLET | Freq: Four times a day (QID) | ORAL | 0 refills | Status: DC | PRN

## 2019-02-23 NOTE — Telephone Encounter (Signed)
Patient left message that she was seen in ED over the weekend for a ruptured ovarian cyst. She is taking tylenol and naproxen for pain but it is not helping. She wanted to know if she needs to be seen or if we can give her something else to help with pain?

## 2019-02-23 NOTE — Telephone Encounter (Signed)
Lortab was e prescribed

## 2019-02-26 ENCOUNTER — Ambulatory Visit (INDEPENDENT_AMBULATORY_CARE_PROVIDER_SITE_OTHER): Payer: Medicaid Other | Admitting: Advanced Practice Midwife

## 2019-02-26 ENCOUNTER — Encounter: Payer: Self-pay | Admitting: Advanced Practice Midwife

## 2019-02-26 ENCOUNTER — Other Ambulatory Visit: Payer: Self-pay

## 2019-02-26 VITALS — BP 118/77 | HR 89 | Ht 63.0 in | Wt 202.8 lb

## 2019-02-26 DIAGNOSIS — N83209 Unspecified ovarian cyst, unspecified side: Secondary | ICD-10-CM

## 2019-02-26 NOTE — Patient Instructions (Signed)
Ovarian Cyst An ovarian cyst is a fluid-filled sac on an ovary. The ovaries are organs that make eggs in women. Most ovarian cysts go away on their own and are not cancerous (are benign). Some cysts need treatment. Follow these instructions at home:  Take over-the-counter and prescription medicines only as told by your doctor.  Do not drive or use heavy machinery while taking prescription pain medicine.  Get pelvic exams and Pap tests as often as told by your doctor.  Return to your normal activities as told by your doctor. Ask your doctor what activities are safe for you.  Do not use any products that contain nicotine or tobacco, such as cigarettes and e-cigarettes. If you need help quitting, ask your doctor.  Keep all follow-up visits as told by your doctor. This is important. Contact a doctor if:  Your periods are: ? Late. ? Irregular. ? Painful.   Your periods stop.  You have pelvic pain that does not go away.  You have pressure on your bladder.  You have trouble making your bladder empty when you pee (urinate).  You have pain during sex.  You have any of the following in your belly (abdomen): ? A feeling of fullness. ? Pressure. ? Discomfort. ? Pain that does not go away. ? Swelling.  You feel sick most of the time.  You have trouble pooping (have constipation).  You are not as hungry as usual (you lose your appetite).  You get very bad acne.  You start to have more hair on your body and face.  You are gaining weight or losing weight without changing your exercise and eating habits.  You think you may be pregnant. Get help right away if:  You have belly pain that is very bad or gets worse.  You cannot eat or drink without throwing up (vomiting).  You suddenly get a fever.  Your period is a lot heavier than usual. This information is not intended to replace advice given to you by your health care provider. Make sure you discuss any questions you have  with your health care provider. Document Revised: 01/04/2017 Document Reviewed: 06/26/2015 Elsevier Patient Education  2020 Elsevier Inc.  

## 2019-02-26 NOTE — Progress Notes (Signed)
GYNECOLOGY PROBLEM VISIT NOTE  History:     Nicole Freeman is a 24 y.o. G50P2002 female here for follow-up to ED visit for ruptured ovarian cyst. Current complaints: residual pelvic pain which resolves with prescribed medication. She endorses scant blood-tinged vaginal discharge and occasional referred pain when defecating or raising her arms.  Denies recurrence of severe abdominal pain, dysuria, fever, abdominal tenderness or other gynecologic concerns.    Gynecologic History Patient's last menstrual period was 02/08/2019 (approximate). Contraception: IUD  Obstetric History OB History  Gravida Para Term Preterm AB Living  2 2 2     2   SAB TAB Ectopic Multiple Live Births        0 2    # Outcome Date GA Lbr Len/2nd Weight Sex Delivery Anes PTL Lv  2 Term 01/08/18 [redacted]w[redacted]d 06:36 / 00:55 8 lb 3.4 oz (3.725 kg) M Vag-Spont EPI  LIV  1 Term      Vag-Spont       Past Medical History:  Diagnosis Date  . Von Willebrand disease (HCC)     Past Surgical History:  Procedure Laterality Date  . WISDOM TOOTH EXTRACTION      Current Outpatient Medications on File Prior to Visit  Medication Sig Dispense Refill  . HYDROcodone-acetaminophen (NORCO/VICODIN) 5-325 MG tablet Take 1 tablet by mouth every 6 (six) hours as needed. 15 tablet 0  . Levonorgestrel (LILETTA, 52 MG,) 19.5 MCG/DAY IUD IUD 1 each by Intrauterine route once.     No current facility-administered medications on file prior to visit.    No Known Allergies  Social History:  reports that she has quit smoking. She smoked 0.50 packs per day. She has never used smokeless tobacco. She reports current alcohol use. She reports that she does not use drugs.  Family History  Problem Relation Age of Onset  . Breast cancer Paternal Aunt   . Lung cancer Paternal Aunt     The following portions of the patient's history were reviewed and updated as appropriate: allergies, current medications, past family history, past  medical history, past social history, past surgical history and problem list.  Review of Systems Pertinent items noted in HPI and remainder of comprehensive ROS otherwise negative.  Physical Exam:  BP 118/77 (BP Location: Right Arm, Patient Position: Sitting, Cuff Size: Large)   Pulse 89   Ht 5\' 3"  (1.6 m)   Wt 202 lb 12.8 oz (92 kg)   LMP 02/08/2019 (Approximate)   Breastfeeding No   BMI 35.92 kg/m  CONSTITUTIONAL: Well-developed, well-nourished female in no acute distress.  SKIN: Skin is warm and dry. No rash noted. Not diaphoretic. No erythema. No pallor. MUSCULOSKELETAL: Normal range of motion. No tenderness.  No cyanosis, clubbing, or edema.  2+ distal pulses. NEUROLOGIC: Alert and oriented to person, place, and time. Normal reflexes, muscle tone coordination.  PSYCHIATRIC: Normal mood and affect. Normal behavior. Normal judgment and thought content. CARDIOVASCULAR: Normal heart rate noted, regular rhythm RESPIRATORY: Effort normal, no problems with respiration noted.   Assessment and Plan:    1. Rupture of ovarian cyst - Discussed timeline for resolution may be 2-3 months - Continue current plan of care - Check in with clinic for recurrence of pain or if not feeling significantly better in four weeks  Routine preventative health maintenance measures emphasized. Please refer to After Visit Summary for other counseling recommendations.     , MSN, CNM Certified Nurse Midwife, 04/08/2019 for [facility], [facility] 02/26/19 9:23 AM

## 2019-03-26 ENCOUNTER — Ambulatory Visit: Payer: Medicaid Other | Admitting: Advanced Practice Midwife

## 2019-04-02 ENCOUNTER — Ambulatory Visit (INDEPENDENT_AMBULATORY_CARE_PROVIDER_SITE_OTHER): Payer: 59 | Admitting: Advanced Practice Midwife

## 2019-04-02 ENCOUNTER — Other Ambulatory Visit: Payer: Self-pay

## 2019-04-02 ENCOUNTER — Encounter: Payer: Self-pay | Admitting: Advanced Practice Midwife

## 2019-04-02 VITALS — BP 114/74 | HR 83 | Ht 63.0 in | Wt 206.0 lb

## 2019-04-02 DIAGNOSIS — N941 Unspecified dyspareunia: Secondary | ICD-10-CM

## 2019-04-02 DIAGNOSIS — N83209 Unspecified ovarian cyst, unspecified side: Secondary | ICD-10-CM | POA: Diagnosis not present

## 2019-04-02 NOTE — Progress Notes (Signed)
[facility] Visit  Patient name: Nicole Freeman MRN [id number]  Date of birth: [birth year]  CC & HPI:  Nicole Freeman is a 24 y.o. Caucasian female presenting today for dyspareunia.  She had a ruptured ovarian cyst last month, has had discomfort w/BM and intercourse ever since. Pain is not severe.  W/BM, it feels like pressure in her vagina, and feels the same sensation w/intercourse.  Started first period since ov cyst, blood is dark. BMs are less uncomfortable since starting period   Pertinent History Reviewed:  Medical & Surgical Hx:   Past Medical History:  Diagnosis Date  . Von Willebrand disease (HCC)    Past Surgical History:  Procedure Laterality Date  . WISDOM TOOTH EXTRACTION     Family History  Problem Relation Age of Onset  . Breast cancer Paternal Aunt   . Lung cancer Paternal Aunt     Current Outpatient Medications:  .  Levonorgestrel (LILETTA, 52 MG,) 19.5 MCG/DAY IUD IUD, 1 each by Intrauterine route once., Disp: , Rfl:  .  HYDROcodone-acetaminophen (NORCO/VICODIN) 5-325 MG tablet, Take 1 tablet by mouth every 6 (six) hours as needed. (Patient not taking: Reported on 04/02/2019), Disp: 15 tablet, Rfl: 0 Social History: Reviewed -  reports that she has quit smoking. She smoked 0.50 packs per day. She has never used smokeless tobacco.  Review of Systems:   Constitutional: Negative for fever and chills Eyes: Negative for visual disturbances Respiratory: Negative for shortness of breath, dyspnea Cardiovascular: Negative for chest pain or palpitations  Gastrointestinal: Negative for vomiting, diarrhea and constipation; no abdominal pain Genitourinary: Negative for dysuria and urgency, vaginal irritation or itching Musculoskeletal: Negative for back pain, joint pain, myalgias  Neurological: Negative for dizziness and headaches    Objective Findings:    Physical Examination: Vitals:   04/02/19 0904  BP: 114/74  Pulse: 83    General appearance - well appearing, and in no distress Mental status - alert, oriented to person, place, and time Chest:  Normal respiratory effort Heart - normal rate and regular rhythm Abdomen:  Soft, nontender Pelvic: tender to bimanual when back of uterus is stimulated.  Musculoskeletal:  Normal range of motion without pain Extremities:  No edema    No results found for this or any previous visit (from the past 24 hour(s)).    Assessment & Plan:  A:   Dyspareunia/uncomfortable BMs since ruptured ov cyst last month, improving P:  Suspect will continue to improve once this period is finished.    Return for If you have any problems.  Scarlette Calico Cresenzo-Dishmon CNM 04/02/2019 9:25 AM

## 2019-04-17 ENCOUNTER — Telehealth: Payer: Self-pay | Admitting: *Deleted

## 2019-04-17 MED ORDER — SULFAMETHOXAZOLE-TRIMETHOPRIM 800-160 MG PO TABS: 1.0000 | ORAL_TABLET | Freq: Two times a day (BID) | ORAL | 0 refills | Status: DC

## 2019-04-17 NOTE — Telephone Encounter (Signed)
Mailbox is full will rx septra ds

## 2019-04-17 NOTE — Telephone Encounter (Signed)
Pt left message that she feels like she has a uti. Tried otc azo but didn't help. Now has back pain and chills, back hurt all night last night. Wanted to see if we can send in rx for her.

## 2019-04-23 ENCOUNTER — Telehealth: Payer: Self-pay | Admitting: *Deleted

## 2019-04-23 NOTE — Telephone Encounter (Signed)
Patient states she is taking an antibiotic for an UTI. Overnight, she developed a headache, fever (100.4) body aches and chills.  She is currently in [place] and is unsure of what she needs to do.   Returned patient's call. Advised that since we did not get a urine specimen prior to starting the antibiotics, a broad spectrum antibiotic was sent in.  Advised to go to an Urgent Care or Medclinic for eval.  May need to go to the ER for IV antibiotics.  Pt verbalized understanding and stated she would go.

## 2019-07-07 ENCOUNTER — Other Ambulatory Visit: Payer: Self-pay | Admitting: Neurology

## 2019-07-07 ENCOUNTER — Other Ambulatory Visit (HOSPITAL_COMMUNITY): Payer: Self-pay | Admitting: Neurology

## 2019-07-07 DIAGNOSIS — R519 Headache, unspecified: Secondary | ICD-10-CM

## 2019-07-17 ENCOUNTER — Emergency Department (HOSPITAL_COMMUNITY): Admission: EM | Admit: 2019-07-17 | Discharge: 2019-07-17 | Discharge status: Absent without leave | Payer: 59

## 2019-07-17 ENCOUNTER — Ambulatory Visit (HOSPITAL_COMMUNITY)
Admission: RE | Admit: 2019-07-17 | Discharge: 2019-07-17 | Disposition: A | Discharge status: Home / Self Care | Payer: 59 | Source: Ambulatory Visit | Attending: Neurology | Admitting: Neurology

## 2019-07-17 ENCOUNTER — Other Ambulatory Visit: Payer: Self-pay

## 2019-07-17 DIAGNOSIS — R519 Headache, unspecified: Secondary | ICD-10-CM

## 2019-07-17 DIAGNOSIS — G935 Compression of brain: Secondary | ICD-10-CM | POA: Diagnosis not present

## 2019-11-24 ENCOUNTER — Encounter: Payer: Self-pay | Admitting: Neurology

## 2019-11-30 NOTE — Progress Notes (Signed)
NEUROLOGY CONSULTATION NOTE  Nicole Freeman MRN: [id number] DOB: 02/25/1995  Referring provider: Wynell Balloon, MD Primary care provider: [facility]  Reason for consult:  headache  HISTORY OF PRESENT ILLNESS: Nicole Freeman is a 24 year old right-handed female with von Willebrand disease who presents for headache.  History supplemented by ophthalmology note.  She started having migraines in 2019 when she became pregnant with her second child.  She had gained about 60 lbs.  After her second child, she had an IUD implanted.  They occurred every other week and then gradually increased in frequency.  Around April or May 2021, headaches became daily.  She endorses severe bitemporal/top of head pounding pain associated with nausea, photophobia, phonophobia, double vision/blurred vision.  They would last until she would lay down in a cool dark room.  Se was taking tylenol or naproxen almost daily.  She also endorsed pulsatile tinnitus.  On a couple of occasions, she noted transient darkening of vision.  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  She was referred to a neurologist in [place].  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  She was started topiramate.  Headaches had almost resolved.  She also lost 10 lbs.  She was referred to ophthalmology, Dr. Wynell Balloon this month who diagnosed pseudopapilledema noting elevated RNFL with hyperemic nerves but no disc edema or HVF defects.  She continues to take topiramate 25mg  at bedtime.  Headaches are controlled on it.  However, she plans to start trying for another baby.  She also reports intermittent numbness and tingling in hands and feet for the past couple of years which has since gotten worse over past  few months.  Her previous neurologist told her that it was likely secondary to the Chiari.  PAST MEDICAL HISTORY: Past Medical History:  Diagnosis Date  . Von Willebrand disease (HCC)     PAST SURGICAL HISTORY: Past Surgical History:  Procedure Laterality Date  . WISDOM TOOTH EXTRACTION      MEDICATIONS: Current Outpatient Medications on File Prior to Visit  Medication Sig Dispense Refill  . HYDROcodone-acetaminophen (NORCO/VICODIN) 5-325 MG tablet Take 1 tablet by mouth every 6 (six) hours as needed. (Patient not taking: Reported on 04/02/2019) 15 tablet 0  . Levonorgestrel (LILETTA, 52 MG,) 19.5 MCG/DAY IUD IUD 1 each by Intrauterine route once.    . sulfamethoxazole-trimethoprim (BACTRIM DS) 800-160 MG tablet Take 1 tablet by mouth 2 (two) times daily. Take 1 bid 14 tablet 0   No current facility-administered medications on file prior to visit.    ALLERGIES: No Known Allergies  FAMILY HISTORY: Family History  Problem Relation Age of Onset  . Breast cancer Paternal Aunt   . Lung cancer Paternal Aunt     SOCIAL HISTORY: Social History   Socioeconomic History  . Marital status: Married    Spouse name: Not on file  . Number of children: 2  . Years of education: Not on file  . Highest education level: Not on file  Occupational History  . Not on file  Tobacco Use  . Smoking status: Former Smoker    Packs/day: 0.50  . Smokeless tobacco: Never Used  . Tobacco comment: quit April 2019  Vaping Use  . Vaping Use: Never used  Substance and Sexual Activity  . Alcohol  use: Yes    Comment: once in a blue moon  . Drug use: No  . Sexual activity: Yes    Birth control/protection: I.U.D.  Other Topics Concern  . Not on file  Social History Narrative  . Not on file   Social Determinants of Health   Financial Resource Strain:   . Difficulty of Paying Living Expenses: Not on file  Food Insecurity:   . Worried About Programme researcher, broadcasting/film/video in the Last Year: Not on file  .  Ran Out of Food in the Last Year: Not on file  Transportation Needs:   . Lack of Transportation (Medical): Not on file  . Lack of Transportation (Non-Medical): Not on file  Physical Activity:   . Days of Exercise per Week: Not on file  . Minutes of Exercise per Session: Not on file  Stress:   . Feeling of Stress : Not on file  Social Connections:   . Frequency of Communication with Friends and Family: Not on file  . Frequency of Social Gatherings with Friends and Family: Not on file  . Attends Religious Services: Not on file  . Active Member of Clubs or Organizations: Not on file  . Attends Banker Meetings: Not on file  . Marital Status: Not on file  Intimate Partner Violence:   . Fear of Current or Ex-Partner: Not on file  . Emotionally Abused: Not on file  . Physically Abused: Not on file  . Sexually Abused: Not on file   PHYSICAL EXAM: Blood pressure 122/76, pulse 95, height 5\' 3"  (1.6 m), weight 197 lb (89.4 kg), SpO2 98 %, not currently breastfeeding. General: No acute distress.  Patient appears well-groomed.   Head:  Normocephalic/atraumatic Eyes:  fundi examined but not visualized Neck: supple, no paraspinal tenderness, full range of motion Back: No paraspinal tenderness Heart: regular rate and rhythm Lungs: Clear to auscultation bilaterally. Vascular: No carotid bruits. Neurological Exam: Mental status: alert and oriented to person, place, and time, recent and remote memory intact, fund of knowledge intact, attention and concentration intact, speech fluent and not dysarthric, language intact. Cranial nerves: CN I: not tested CN II: pupils equal, round and reactive to light, visual fields intact CN III, IV, VI:  full range of motion, no nystagmus, no ptosis CN V: facial sensation intact CN VII: upper and lower face symmetric CN VIII: hearing intact CN IX, X: gag intact, uvula midline CN XI: sternocleidomastoid and trapezius muscles intact CN XII: tongue  midline Bulk & Tone: normal, no fasciculations. Motor:  5/5 throughout  Sensation:  Pinprick and vibration sensation intact.   Deep Tendon Reflexes:  2+ throughout, toes downgoing.   Finger to nose testing:  Without dysmetria.   Heel to shin:  Without dysmetria.   Gait:  Normal station and stride. Romberg negative  IMPRESSION & PLAN: 1.  Migraine without aura, without status migrainosus, not intractable.  I do believe her headaches are migraines.   2.  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  Her Chiari is borderline.  There is no abnormal morphology of the cerebellum, brainstem and upper cervical spinal cord.  She endorses intermittent numbness and tingling but I don't believe it is related.  It is likely worse now due to topiramate. 3.  Questionable idiopathic intracranial hypertension.  She reportedly had papilledema on an optometrist's exam back in May.  There was no obvious papilledema on eye exam this month.  It is unknown if  she truly had papilledema.  She may have had it and it has improved since starting topiramate and losing 10 lbs.    She and her husband would like to start trying to have another baby.  She plans to have her IUD removed.  I think that is reasonable but she was advised that she would need to discontinue topiramate prior to trying.  She is on the lowest dose and can just stop it.  I recommended to instead start magnesium oxide 400mg  and riboflavin 400mg  daily as migraine prevention.  Given the questionable intracranial hypertension and headaches, she may benefit from remaining off birth control anyway.  We discussed risk of rebound headache with medication overuse.  She will need to have routine follow up eye exams with Dr. to ensure there is no development of papilledema or visual field loss.  Regarding paresthesias, we will check B12 level.  Follow up in 4 months.  Thank you for allowing me to take part in the care of this  patient.  , DO  CC:  Alben Spittle, MD

## 2019-12-01 ENCOUNTER — Encounter: Payer: Self-pay | Admitting: Neurology

## 2019-12-01 ENCOUNTER — Other Ambulatory Visit: Payer: Self-pay

## 2019-12-01 ENCOUNTER — Ambulatory Visit (INDEPENDENT_AMBULATORY_CARE_PROVIDER_SITE_OTHER): Payer: 59 | Admitting: Neurology

## 2019-12-01 ENCOUNTER — Other Ambulatory Visit (INDEPENDENT_AMBULATORY_CARE_PROVIDER_SITE_OTHER): Payer: 59

## 2019-12-01 VITALS — BP 122/76 | HR 95 | Ht 63.0 in | Wt 197.0 lb

## 2019-12-01 DIAGNOSIS — R200 Anesthesia of skin: Secondary | ICD-10-CM

## 2019-12-01 DIAGNOSIS — G43009 Migraine without aura, not intractable, without status migrainosus: Secondary | ICD-10-CM | POA: Diagnosis not present

## 2019-12-01 DIAGNOSIS — G935 Compression of brain: Secondary | ICD-10-CM | POA: Diagnosis not present

## 2019-12-01 DIAGNOSIS — R202 Paresthesia of skin: Secondary | ICD-10-CM

## 2019-12-01 LAB — VITAMIN B12: Vitamin B-12: 437 pg/mL (ref 211–911)

## 2019-12-01 NOTE — Patient Instructions (Signed)
1.  You may discontinue topiramate 2.  Start magnesium oxide 400mg  daily and riboflavin 400mg  daily 3.  Limit use of pain relievers to no more than 2 days out of week to prevent risk of rebound or medication-overuse headache. 4.  Follow up with Dr. for routine eye exams 5.  Check B12 level 6.  Follow up in 4 months.

## 2019-12-02 ENCOUNTER — Telehealth: Payer: Self-pay

## 2019-12-02 NOTE — Telephone Encounter (Signed)
Called patient and informed her of normal results.  

## 2019-12-02 NOTE — Telephone Encounter (Signed)
-----   Message from Adam R Jaffe, DO sent at 12/02/2019  7:06 AM EDT ----- B12 is normal 

## 2019-12-07 ENCOUNTER — Ambulatory Visit: Payer: 59 | Admitting: Women's Health

## 2019-12-09 ENCOUNTER — Ambulatory Visit (INDEPENDENT_AMBULATORY_CARE_PROVIDER_SITE_OTHER): Payer: 59 | Admitting: Student

## 2019-12-09 ENCOUNTER — Encounter: Payer: Self-pay | Admitting: Student

## 2019-12-09 ENCOUNTER — Other Ambulatory Visit: Payer: Self-pay

## 2019-12-09 VITALS — BP 121/78 | HR 87 | Ht 62.0 in | Wt 195.0 lb

## 2019-12-09 DIAGNOSIS — Z30432 Encounter for removal of intrauterine contraceptive device: Secondary | ICD-10-CM

## 2019-12-09 NOTE — Progress Notes (Signed)
  History:  Ms. JALYAH WEINHEIMER is a 24 y.o. [id number] who presents to clinic today for IUD removal. She denies any complaints; she would like to start trying for a third child. Her last pap was in 2019, was normal. She denies any other ob-gyn complaints.   The following portions of the patient's history were reviewed and updated as appropriate: allergies, current medications, family history, past medical history, social history, past surgical history and problem list.  Review of Systems:  Review of Systems  Constitutional: Negative.   HENT: Negative.   Gastrointestinal: Negative.   Genitourinary: Negative.   Musculoskeletal: Negative.   Skin: Negative.       Objective:  Physical Exam BP 121/78 (BP Location: Left Arm, Patient Position: Sitting, Cuff Size: Normal)   Pulse 87   Ht 5\' 2"  (1.575 m)   Wt 195 lb (88.5 kg)   BMI 35.67 kg/m  Physical Exam Constitutional:      Appearance: Normal appearance.  Genitourinary:    General: Normal vulva.     Comments: IUD strings visualized, no other lesions or discharge in the vagina. NEFG.  Musculoskeletal:        General: Normal range of motion.  Neurological:     Mental Status: She is alert.       Labs and Imaging No results found for this or any previous visit (from the past 24 hour(s)).  No results found.   Assessment & Plan:   1. Encounter for IUD removal     -see procedure note below   Approximately 12 minutes of total time was spent with this patient on counseling and direct patient care.   , CNM 12/09/2019 11:45 AM     GYNECOLOGY OFFICE PROCEDURE NOTE  Shaine [place] is a 24 y.o. 22 here for Liletta IUD removal. No GYN concerns.  Last pap smear was on 2019 and was normal.  IUD Removal  Patient identified, informed consent performed, consent signed.  Patient was in the dorsal lithotomy position, normal external genitalia was noted.  A speculum was  placed in the patient's vagina, normal discharge was noted, no lesions. The cervix was visualized, no lesions, no abnormal discharge.  The strings of the IUD were grasped and pulled using ring forceps. The IUD was removed in its entirety.  Patient tolerated the procedure well.    Patient will use nothing for contraception/plans for pregnancy soon and she was told to avoid teratogens, take PNV and folic acid.  Routine preventative health maintenance measures emphasized.

## 2020-02-09 ENCOUNTER — Ambulatory Visit: Payer: 59 | Admitting: Neurology

## 2020-04-12 NOTE — Progress Notes (Signed)
NEUROLOGY FOLLOW UP OFFICE NOTE  Nicole Freeman [id number]  Assessment/Plan:   1.  Migraine without aura, without status migrainosus, not intractable, improved off of birth control. 2. Questionable idiopathic intracranial hypertension.  She reportedly had papilledema on an optometrist's exam back in May 2021.  There was no obvious papilledema on eye exam in October 2021.  It is unknown if she truly had papilledema.  She may have had it and it has improved since starting topiramate and losing 10 lbs.   3.  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  Her Chiari is borderline.  There is no abnormal morphology of the cerebellum, brainstem and upper cervical spinal cord.  1.  Continue to monitor 2.  Tylenol 500mg  and naproxen 220mg  as needed.  Limit use of pain relievers to no more than 2 days out of week to prevent risk of rebound or medication-overuse headache. 3.  Keep headache diary 4.  Follow up in 6 months.  Subjective:  Nicole Freeman is a 25 year old right-handed female with von Willebrand disease who follows up for headaches.  UPDATE: Patient and her husband started trying to have a child, so topiramate was discontinued and she was started on magnesium oxide 400mg  and riboflavin 400mg  daily but then subsequently stopped because she felt they may have made headaches worse.  However, once she stopped her birth control, the headaches have significantly improved.  They are moderate to severe, 2-4 hours duration but occur once every 2 weeks.  Takes Tylenol 500mg  with naproxen 220mg .   B12 was 437.  HISTORY: She started having migraines in 2019 when she became pregnant with her second child.  She had gained about 60 lbs.  After her second child, she had an IUD implanted.  They occurred every other week and then gradually increased in frequency.  Around April or May 2021, headaches became daily.  She endorses severe  bitemporal/top of head pounding pain associated with nausea, photophobia, phonophobia, double vision/blurred vision.  They would last until she would lay down in a cool dark room.  Se was taking tylenol or naproxen almost daily.  She also endorsed pulsatile tinnitus.  On a couple of occasions, she noted transient darkening of vision.  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  She was referred to a neurologist in [place].  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  She was started topiramate.  Headaches had almost resolved.  She also lost 10 lbs.  She was referred to ophthalmology, Dr. 2020 this month who diagnosed pseudopapilledema noting elevated RNFL with hyperemic nerves but no disc edema or HVF defects.  She continues to take topiramate 25mg  at bedtime.  Headaches are controlled on it.  However, she plans to start trying for another baby.  She also reports intermittent numbness and tingling in hands and feet for the past couple of years which has since gotten worse over past few months.  Her previous neurologist told her that it was likely secondary to the Chiari.  PAST MEDICAL HISTORY: Past Medical History:  Diagnosis Date  . Chiari malformation type I (HCC)   . Von Willebrand disease (HCC)     MEDICATIONS: Current Outpatient Medications on File Prior to Visit  Medication Sig Dispense Refill  . Levonorgestrel (  LILETTA, 52 MG,) 19.5 MCG/DAY IUD IUD 1 each by Intrauterine route once.     No current facility-administered medications on file prior to visit.    ALLERGIES: No Known Allergies  FAMILY HISTORY: Family History  Problem Relation Age of Onset  . Breast cancer Paternal Aunt   . Lung cancer Paternal Aunt       Objective:  Blood pressure 122/60, pulse 80, height 5\' 2"   (1.575 m), weight 199 lb 3.2 oz (90.4 kg), SpO2 100 %. General: No acute distress.  Patient appears well-groomed.        , DO

## 2020-04-14 ENCOUNTER — Other Ambulatory Visit: Payer: Self-pay

## 2020-04-14 ENCOUNTER — Ambulatory Visit (INDEPENDENT_AMBULATORY_CARE_PROVIDER_SITE_OTHER): Payer: 59 | Admitting: Neurology

## 2020-04-14 ENCOUNTER — Encounter: Payer: Self-pay | Admitting: Neurology

## 2020-04-14 VITALS — BP 122/60 | HR 80 | Ht 62.0 in | Wt 199.2 lb

## 2020-04-14 DIAGNOSIS — G935 Compression of brain: Secondary | ICD-10-CM | POA: Diagnosis not present

## 2020-04-14 DIAGNOSIS — G43009 Migraine without aura, not intractable, without status migrainosus: Secondary | ICD-10-CM

## 2020-04-14 NOTE — Patient Instructions (Signed)
Limit use of pain relievers to no more than 2 days out of week to prevent risk of rebound or medication-overuse headache. Keep headache diary Follow up 6 months

## 2020-06-13 ENCOUNTER — Encounter: Payer: Self-pay | Admitting: Women's Health

## 2020-06-13 ENCOUNTER — Ambulatory Visit (INDEPENDENT_AMBULATORY_CARE_PROVIDER_SITE_OTHER): Payer: 59 | Admitting: Women's Health

## 2020-06-13 ENCOUNTER — Other Ambulatory Visit (HOSPITAL_COMMUNITY)
Admission: RE | Admit: 2020-06-13 | Discharge: 2020-06-13 | Disposition: A | Discharge status: Home / Self Care | Payer: 59 | Source: Ambulatory Visit | Attending: Obstetrics & Gynecology | Admitting: Obstetrics & Gynecology

## 2020-06-13 ENCOUNTER — Other Ambulatory Visit: Payer: Self-pay

## 2020-06-13 VITALS — Ht 62.0 in | Wt 196.0 lb

## 2020-06-13 DIAGNOSIS — R399 Unspecified symptoms and signs involving the genitourinary system: Secondary | ICD-10-CM | POA: Diagnosis not present

## 2020-06-13 DIAGNOSIS — Z319 Encounter for procreative management, unspecified: Secondary | ICD-10-CM | POA: Diagnosis not present

## 2020-06-13 DIAGNOSIS — Z6835 Body mass index (BMI) 35.0-35.9, adult: Secondary | ICD-10-CM | POA: Diagnosis not present

## 2020-06-13 DIAGNOSIS — Z01419 Encounter for gynecological examination (general) (routine) without abnormal findings: Secondary | ICD-10-CM | POA: Diagnosis not present

## 2020-06-13 DIAGNOSIS — Z113 Encounter for screening for infections with a predominantly sexual mode of transmission: Secondary | ICD-10-CM

## 2020-06-13 NOTE — Progress Notes (Signed)
   GYN VISIT Patient name: Nicole Freeman MRN [id number]  Date of birth: 1995-07-13 Chief Complaint:   Urinary Tract Infection (Currently on abx. Wants to discuss why she gets them around her period every month. )  History of Present Illness:   Nicole Freeman is a 26 y.o. [id number] Caucasian female being seen today for report of UTI sx 2d before and 2d into period. Has dysuria, urgency, sx usually last few hours then go away on their own. Last week sx were more intense and also had a cold, went to PCP and was rx'd Levaquin. Denies abnormal discharge, itching/odor/irritation.   Trying to get pregnant, taking pnv. Having regular periods. Wants to lose weight, requests referral to dietician.  Depression screen [facility] 2/9 05/12/2018  Decreased Interest 0  Down, Depressed, Hopeless 0  PHQ - 2 Score 0  Altered sleeping 0  Tired, decreased energy 0  Change in appetite 0  Feeling bad or failure about yourself  0  Trouble concentrating 0  Moving slowly or fidgety/restless 0  Suicidal thoughts 0  PHQ-9 Score 0    No LMP recorded. (Menstrual status: IUD). The current method of family planning is none.  Last pap 05/28/17. Results were: NILM w/ HRHPV not done Review of Systems:   Pertinent items are noted in HPI Denies fever/chills, dizziness, headaches, visual disturbances, fatigue, shortness of breath, chest pain, abdominal pain, vomiting, abnormal vaginal discharge/itching/odor/irritation, problems with periods, bowel movements, urination, or intercourse unless otherwise stated above.  Pertinent History Reviewed:  Reviewed past medical,surgical, social, obstetrical and family history.  Reviewed problem list, medications and allergies. Physical Assessment:   Vitals:   06/13/20 0919  Weight: 196 lb (88.9 kg)  Height: 5\' 2"  (1.575 m)  Body mass index is 35.85 kg/m.       Physical Examination:   General appearance: alert, well appearing, and in no distress  Mental status:  alert, oriented to person, place, and time  Skin: warm & dry   Cardiovascular: normal heart rate noted  Respiratory: normal respiratory effort, no distress  Abdomen: soft, non-tender   Pelvic: VULVA: normal appearing vulva with no masses, tenderness or lesions, VAGINA: normal appearing vagina with normal color and discharge, no lesions, CERVIX: normal appearing cervix without discharge or lesions. Thin prep pap obtained.  Extremities: no edema   Chaperone:    No results found for this or any previous visit (from the past 24 hour(s)).  Assessment & Plan:  1) UTI sx before and during period> resolve on their own, discussed estrogen is lower during this time, can make more prone to UTIs, but normally UTIs do not resolve on their own. Finish levaquin course. If sx return and are not going away let Faith Rogue know.   2) Due for pap> collected today  3) STD screen> gc/ct from pap  4) Trying to conceive> continue pnv, let me know when gets +HPT  5) BMI 35> requests referral to dietician, so order placed  Meds: No orders of the defined types were placed in this encounter.   Orders Placed This Encounter  Procedures  . Amb ref to Medical Nutrition Therapy-MNT    Return in about 1 year (around 06/13/2021) for Physical.  08/13/2021 CNM, WHNP-BC 06/13/2020 9:52 AM

## 2020-06-13 NOTE — Addendum Note (Signed)
Addended by: Annamarie Dawley on: 06/13/2020 09:53 AM   Modules accepted: Orders

## 2020-06-16 LAB — CYTOLOGY - PAP
Chlamydia: NEGATIVE
Comment: NEGATIVE
Comment: NEGATIVE
Comment: NORMAL
Diagnosis: NEGATIVE
High risk HPV: NEGATIVE
Neisseria Gonorrhea: NEGATIVE

## 2020-07-21 ENCOUNTER — Other Ambulatory Visit: Payer: Self-pay

## 2020-07-21 ENCOUNTER — Encounter: Payer: 59 | Attending: Advanced Practice Midwife | Admitting: Nutrition

## 2020-07-21 VITALS — Ht 62.0 in | Wt 195.6 lb

## 2020-07-21 DIAGNOSIS — E669 Obesity, unspecified: Secondary | ICD-10-CM

## 2020-07-21 NOTE — Progress Notes (Signed)
Medical Nutrition Therapy  Appointment Start time:  1300  Appointment End time:  1400  Primary concerns today: Obesity BMI > 30  Referral diagnosis: E66.9 Preferred learning style: variety  Learning readiness: Change    NUTRITION ASSESSMENT   BMI 35.  Diet is high in processed foods. She wants to lose about 20 lbs. Has gotten into the habit if eating what the kids eat-processed and fast foods. Wants to get back into cooking healthier meals and exercising.   Anthropometrics  Wt Readings from Last 3 Encounters:  07/21/20 195 lb 9.6 oz (88.7 kg)  06/13/20 196 lb (88.9 kg)  04/14/20 199 lb 3.2 oz (90.4 kg)   Ht Readings from Last 3 Encounters:  07/21/20 5\' 2"  (1.575 m)  06/13/20 5\' 2"  (1.575 m)  04/14/20 5\' 2"  (1.575 m)   Body mass index is 35.78 kg/m. @BMIFA @ Facility age limit for growth percentiles is 20 years. Facility age limit for growth percentiles is 20 years.    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 Medications: none Labs:  CMP Latest Ref Rng & Units 05/21/2017 08/10/2016 01/06/2014  Glucose 65 - 99 mg/dL 87 90 )  BUN 6 - 20 mg/dL 15 18 14   Creatinine 0.44 - 1.00 mg/dL 05/23/2017 10/11/2016 14/03/2013  Sodium 135 - 145 mmol/L 134(L) 136 135(L)  Potassium 3.5 - 5.1 mmol/L 3.7 3.3(L) 4.3  Chloride 101 - 111 mmol/L 103 104 102  CO2 22 - 32 mmol/L 20(L) 21(L) 28  Calcium 8.9 - 10.3 mg/dL 9.6 9.1 9.8  Total Protein 6.5 - 8.1 g/dL 8.1 7.5 8.2  Total Bilirubin 0.3 - 1.2 mg/dL 0.6 0.7 0.8  Alkaline Phos 38 - 126 U/L 43 271(H) 70  AST 15 - 41 U/L 16 104(H) 21  ALT 14 - 54 U/L 14 266(H) 20    Notable Signs/Symptoms: Fatigue  Lifestyle & Dietary Hx Stays at home with 2 kids; 8 and 2 yr old. Husband works frulltime. Wants to lose weight. Wants to start eating healthier and cook more foods at home.  Estimated daily fluid intake: 64 oz Supplements: Prenatal,  Sleep: 8 Stress / self-care: none Current average weekly physical activity: [employer]  24-Hr Dietary Recall First  Meal: uncrustables, 8 am Snack:  Second Meal: 11 am hot pocket, chips, water Snack: fruit snacks Third Meal: chicken alfredo and 1 slice garlic bread,  Snack: Ice cream Beverages: water  Estimated Energy Needs Calories: 1200 Carbohydrate: 135g Protein: 90g Fat: 33g   NUTRITION DIAGNOSIS  NI-1.5 Excessive energy intake As related to hih .  As evidenced by none.   NUTRITION INTERVENTION  Nutrition education (E-1) on the following topics:  Nutrition and  Pre-Diabetes/Weight loss education provided on My Plate, CHO counting, meal planning, portion sizes, timing of meals, avoiding snacks between meals, taking medications as prescribed, benefits of exercising 60 minutes per day and prevention of DM. Weight loss tips-nutrient dense foods vs empty calorie foods  Handouts Provided Include  My Plate Weight loss tips Meal Plan Carb   Learning Style & Readiness for Change Teaching method utilized: Visual & Auditory  Demonstrated degree of understanding via: Teach Back  Barriers to learning/adherence to lifestyle change: none  Goals Established by Pt Goals  Keep food journal Cut down on sweets and desserts Increase more lower carb vegetables Eat meals on time Only eat with meals at times discussed Plan meals and do meal prepping Lose 1 lb per week   MONITORING & EVALUATION Dietary intake, weekly physical activity, and weight in  1 month.  Next Steps  Patient is to track what you are eating and drinking and work on meal planning.Marland Kitchen

## 2020-07-21 NOTE — Patient Instructions (Signed)
Goals  Keep food journal Cut down on sweets and desserts Increase more lower carb vegetables Eat meals on time Only eat with meals at times discussed Plan meals and do meal prepping Lose 1 lb per week

## 2020-07-26 ENCOUNTER — Encounter: Payer: Self-pay | Admitting: Nutrition

## 2020-08-10 ENCOUNTER — Other Ambulatory Visit: Payer: Self-pay | Admitting: Women's Health

## 2020-08-10 MED ORDER — NITROFURANTOIN MONOHYD MACRO 100 MG PO CAPS: 100.0000 mg | ORAL_CAPSULE | Freq: Two times a day (BID) | ORAL | 0 refills | Status: DC

## 2020-08-18 ENCOUNTER — Other Ambulatory Visit: Payer: Self-pay

## 2020-08-18 ENCOUNTER — Ambulatory Visit
Admission: EM | Admit: 2020-08-18 | Discharge: 2020-08-18 | Disposition: A | Discharge status: Home / Self Care | Payer: 59 | Attending: Emergency Medicine | Admitting: Emergency Medicine

## 2020-08-18 ENCOUNTER — Encounter: Payer: Self-pay | Admitting: Emergency Medicine

## 2020-08-18 DIAGNOSIS — J019 Acute sinusitis, unspecified: Secondary | ICD-10-CM | POA: Diagnosis not present

## 2020-08-18 DIAGNOSIS — R059 Cough, unspecified: Secondary | ICD-10-CM

## 2020-08-18 MED ORDER — PREDNISONE 20 MG PO TABS: 20.0000 mg | ORAL_TABLET | Freq: Two times a day (BID) | ORAL | 0 refills | Status: AC

## 2020-08-18 MED ORDER — AMOXICILLIN-POT CLAVULANATE 875-125 MG PO TABS: 1.0000 | ORAL_TABLET | Freq: Two times a day (BID) | ORAL | 0 refills | Status: AC

## 2020-08-18 NOTE — Discharge Instructions (Addendum)
Declines covid Get plenty of rest and push fluids Augmentin and prednisone prescribed Use OTC zyrtec for nasal congestion, runny nose, and/or sore throat Use OTC flonase for nasal congestion and runny nose Use medications daily for symptom relief Use OTC medications like ibuprofen or tylenol as needed fever or pain Call or go to the ED if you have any new or worsening symptoms such as fever, cough, shortness of breath, chest tightness, chest pain, turning blue, changes in mental status, etc..Marland Kitchen

## 2020-08-18 NOTE — ED Triage Notes (Signed)
Productive cough with green sputum x the past few days.  States throat feels sore from drainage.  Recently treated for an UTI.  Finished antibiotic yesterday - nitrofurantoin

## 2020-08-18 NOTE — ED Provider Notes (Signed)
[facility]   [id number] 08/18/20 Arrival Time: 1108   CC: COVID symptoms  SUBJECTIVE: History from: patient.  Nicole Freeman is a 25 y.o. female who presents with sore throat, runny nose, congestion, and cough x 4-5 days.  Denies sick exposure to COVID, flu or strep.  Denies alleviating or aggravating factors.  Reports previous symptoms in the past.   Denies fever, SOB, wheezing, chest pain, nausea, changes in bowel or bladder habits.    ROS: As per HPI.  All other pertinent ROS negative.     Past Medical History:  Diagnosis Date   Chiari malformation type I (HCC)    Von Willebrand disease (HCC)    Past Surgical History:  Procedure Laterality Date   WISDOM TOOTH EXTRACTION     No Known Allergies No current facility-administered medications on file prior to encounter.   Current Outpatient Medications on File Prior to Encounter  Medication Sig Dispense Refill   nitrofurantoin, macrocrystal-monohydrate, (MACROBID) 100 MG capsule Take 1 capsule (100 mg total) by mouth 2 (two) times daily. X 7 days 14 capsule 0   Social History   Socioeconomic History   Marital status: Married    Spouse name: Not on file   Number of children: 2   Years of education: Not on file   Highest education level: Not on file  Occupational History   Not on file  Tobacco Use   Smoking status: Former    Packs/day: 0.50    Types: Cigarettes   Smokeless tobacco: Never   Tobacco comments:    quit April 2019  Vaping Use   Vaping Use: Never used  Substance and Sexual Activity   Alcohol use: Yes    Comment: once in a blue moon   Drug use: No   Sexual activity: Yes    Birth control/protection: I.U.D.  Other Topics Concern   Not on file  Social History Narrative   Right Handed   Lives in a one story    Drinks Caffeine    Social Determinants of Health   Financial Resource Strain: Not on file  Food Insecurity: Not on file  Transportation Needs: Not on file  Physical  Activity: Not on file  Stress: Not on file  Social Connections: Not on file  Intimate Partner Violence: Not on file   Family History  Problem Relation Age of Onset   Breast cancer Paternal Aunt    Lung cancer Paternal Aunt     OBJECTIVE:  Vitals:   08/18/20 1137  BP: 118/80  Pulse: 92  Resp: 16  Temp: 98.6 F (37 C)  TempSrc: Oral  SpO2: 99%    General appearance: alert; well-appearing, nontoxic; speaking in full sentences and tolerating own secretions HEENT: NCAT; Ears: EACs clear, TMs pearly gray; Eyes: PERRL.  EOM grossly intact.Nose: nares patent without rhinorrhea, Throat: oropharynx clear, tonsils non erythematous or enlarged, uvula midline  Neck: supple without LAD Lungs: unlabored respirations, symmetrical air entry; cough: absent; no respiratory distress; CTAB Heart: regular rate and rhythm.  Skin: warm and dry Psychological: alert and cooperative; normal mood and affect   ASSESSMENT & PLAN:  1. Acute non-recurrent sinusitis, unspecified location   2. Cough     Meds ordered this encounter  Medications   predniSONE (DELTASONE) 20 MG tablet    Sig: Take 1 tablet (20 mg total) by mouth 2 (two) times daily with a meal for 5 days.    Dispense:  10 tablet    Refill:  0  Order Specific Question:   Supervising Provider    Answer:   Eustace Moore [[id number]]   amoxicillin-clavulanate (AUGMENTIN) 875-125 MG tablet    Sig: Take 1 tablet by mouth every 12 (twelve) hours for 10 days.    Dispense:  20 tablet    Refill:  0    Order Specific Question:   Supervising Provider    Answer:   Eustace Moore [[id number]]   Declines covid Get plenty of rest and push fluids Augmentin and prednisone prescribed Use OTC zyrtec for nasal congestion, runny nose, and/or sore throat Use OTC flonase for nasal congestion and runny nose Use medications daily for symptom relief Use OTC medications like ibuprofen or tylenol as needed fever or pain Call or go to the ED if you have  any new or worsening symptoms such as fever, cough, shortness of breath, chest tightness, chest pain, turning blue, changes in mental status, etc...   Reviewed expectations re: course of current medical issues. Questions answered. Outlined signs and symptoms indicating need for more acute intervention. Patient verbalized understanding. After Visit Summary given.          Rennis Harding, PA-C 08/18/20 1226

## 2020-08-25 ENCOUNTER — Telehealth: Payer: Self-pay | Admitting: Nutrition

## 2020-08-25 ENCOUNTER — Ambulatory Visit: Payer: 59 | Admitting: Nutrition

## 2020-08-25 NOTE — Telephone Encounter (Signed)
VM left to call and reschedule missed appointment.

## 2020-09-26 ENCOUNTER — Other Ambulatory Visit: Payer: Self-pay

## 2020-09-26 ENCOUNTER — Other Ambulatory Visit (INDEPENDENT_AMBULATORY_CARE_PROVIDER_SITE_OTHER): Payer: 59

## 2020-09-26 DIAGNOSIS — R399 Unspecified symptoms and signs involving the genitourinary system: Secondary | ICD-10-CM | POA: Diagnosis not present

## 2020-09-26 LAB — POCT URINALYSIS DIPSTICK OB
Glucose, UA: NEGATIVE
Ketones, UA: NEGATIVE
Nitrite, UA: POSITIVE

## 2020-09-26 MED ORDER — NITROFURANTOIN MONOHYD MACRO 100 MG PO CAPS: 100.0000 mg | ORAL_CAPSULE | Freq: Two times a day (BID) | ORAL | 0 refills | Status: DC

## 2020-09-26 NOTE — Progress Notes (Addendum)
   NURSE VISIT- UTI SYMPTOMS   SUBJECTIVE:  Nicole Freeman is a 25 y.o. G42P2002 female here for UTI symptoms. She is a GYN patient. She reports cloudy urine, lower abdominal pain, urinary frequency, urinary urgency, and burning with urination .  OBJECTIVE:  There were no vitals taken for this visit.  Appears well, in no apparent distress  Results for orders placed or performed in visit on 09/26/20 (from the past 24 hour(s))  POC Urinalysis Dipstick OB   Collection Time: 09/26/20  4:04 PM  Result Value Ref Range   Color, UA     Clarity, UA     Glucose, UA Negative Negative   Bilirubin, UA     Ketones, UA neg    Spec Grav, UA     Blood, UA large    pH, UA     POC,PROTEIN,UA Moderate (2+) Negative, Trace, Small (1+), Moderate (2+), Large (3+), 4+   Urobilinogen, UA     Nitrite, UA pos    Leukocytes, UA Trace (A) Negative   Appearance     Odor      ASSESSMENT: GYN patient with UTI symptoms and positive nitrites  PLAN: Note routed to Joellyn Haff, CNM, [facility]   Rx sent by provider today: No Urine culture sent Call or return to clinic prn if these symptoms worsen or fail to improve as anticipated. Follow-up: as needed   Naimah Yingst A Asuncion Shibata  09/26/2020 4:05 PM   Chart reviewed for nurse visit. Agree with plan of care. Rx macrobid. Cheral Marker, [place] 09/26/2020 4:30 PM

## 2020-09-26 NOTE — Addendum Note (Signed)
Addended by: Cheral Marker on: 09/26/2020 04:31 PM   Modules accepted: Orders

## 2020-09-27 ENCOUNTER — Other Ambulatory Visit: Payer: Self-pay

## 2020-09-27 DIAGNOSIS — R399 Unspecified symptoms and signs involving the genitourinary system: Secondary | ICD-10-CM

## 2020-09-29 LAB — URINE CULTURE

## 2020-10-16 ENCOUNTER — Ambulatory Visit
Admission: EM | Admit: 2020-10-16 | Discharge: 2020-10-16 | Disposition: A | Discharge status: Home / Self Care | Payer: 59 | Attending: Family Medicine | Admitting: Family Medicine

## 2020-10-16 DIAGNOSIS — J45901 Unspecified asthma with (acute) exacerbation: Secondary | ICD-10-CM | POA: Diagnosis not present

## 2020-10-16 MED ORDER — ALBUTEROL SULFATE HFA 108 (90 BASE) MCG/ACT IN AERS
2.0000 | INHALATION_SPRAY | Freq: Once | RESPIRATORY_TRACT | Status: AC
  Administered 2020-10-16: 2 via RESPIRATORY_TRACT

## 2020-10-16 MED ORDER — ALBUTEROL SULFATE HFA 108 (90 BASE) MCG/ACT IN AERS: 1.0000 | INHALATION_SPRAY | Freq: Four times a day (QID) | RESPIRATORY_TRACT | 0 refills | Status: DC | PRN

## 2020-10-16 MED ORDER — PREDNISONE 20 MG PO TABS: 40.0000 mg | ORAL_TABLET | Freq: Every day | ORAL | 0 refills | Status: DC

## 2020-10-16 MED ORDER — BENZONATATE 100 MG PO CAPS: 200.0000 mg | ORAL_CAPSULE | Freq: Three times a day (TID) | ORAL | 0 refills | Status: DC | PRN

## 2020-10-16 MED ORDER — AZITHROMYCIN 250 MG PO TABS: ORAL_TABLET | ORAL | 0 refills | Status: DC

## 2020-10-16 NOTE — ED Provider Notes (Signed)
[facility]    CSN: [id number] Arrival date & time: 10/16/20  1104      History   Chief Complaint Chief Complaint  Patient presents with   Cough   Nasal Congestion    HPI Nicole Freeman is a 25 y.o. female.   HPI Patient with a known history of chronic recurrent allergies and chronic sinusitis presents today with persistent cough and wheezing x4 days.  She reports symptoms initially started as nasal drainage and has developed into a persistent cough with some productivity and wheezing.  Denies any known sick contacts.  She has not had fever.  Past Medical History:  Diagnosis Date   Chiari malformation type I (HCC)    Von Willebrand disease (HCC)     Patient Active Problem List   Diagnosis Date Noted   Allergic contact dermatitis 05/12/2018   Encounter for IUD insertion 02/25/2018   Tobacco use 05/28/2017   Von Willebrand disease (HCC) 05/28/2017    Past Surgical History:  Procedure Laterality Date   WISDOM TOOTH EXTRACTION      OB History     Gravida  2   Para  2   Term  2   Preterm      AB      Living  2      SAB      IAB      Ectopic      Multiple  0   Live Births  2            Home Medications    Prior to Admission medications   Medication Sig Start Date End Date Taking? Authorizing Provider  albuterol (VENTOLIN HFA) 108 (90 Base) MCG/ACT inhaler Inhale 1-2 puffs into the lungs every 6 (six) hours as needed for wheezing or shortness of breath. 10/16/20  Yes Bing Neighbors, FNP  azithromycin (ZITHROMAX) 250 MG tablet Take 2 tabs PO x 1 dose, then 1 tab PO QD x 4 days 10/16/20  Yes Bing Neighbors, FNP  benzonatate (TESSALON) 100 MG capsule Take 2 capsules (200 mg total) by mouth 3 (three) times daily as needed for cough. 10/16/20  Yes Bing Neighbors, FNP  predniSONE (DELTASONE) 20 MG tablet Take 2 tablets (40 mg total) by mouth daily with breakfast. 10/16/20  Yes Bing Neighbors, FNP   nitrofurantoin, macrocrystal-monohydrate, (MACROBID) 100 MG capsule Take 1 capsule (100 mg total) by mouth 2 (two) times daily. X 7 days 09/26/20   Cheral Marker, CNM    Family History Family History  Problem Relation Age of Onset   Breast cancer Paternal Aunt    Lung cancer Paternal Aunt     Social History Social History   Tobacco Use   Smoking status: Former    Packs/day: 0.50    Types: Cigarettes   Smokeless tobacco: Never   Tobacco comments:    quit April 2019  Vaping Use   Vaping Use: Never used  Substance Use Topics   Alcohol use: Yes    Comment: once in a blue moon   Drug use: No     Allergies   Patient has no known allergies.   Review of Systems Review of Systems Pertinent negatives listed in HPI   Physical Exam Triage Vital Signs ED Triage Vitals  Enc Vitals Group     BP 10/16/20 1231 125/66     Pulse Rate 10/16/20 1231 99     Resp 10/16/20 1231 16     Temp 10/16/20  1231 98.1 F (36.7 C)     Temp Source 10/16/20 1231 Oral     SpO2 10/16/20 1231 98 %     Weight --      Height --      Head Circumference --      Peak Flow --      Pain Score 10/16/20 1229 0     Pain Loc --      Pain Edu? --      Excl. in GC? --    No data found.  Updated Vital Signs BP 125/66 (BP Location: Right Arm)   Pulse 99   Temp 98.1 F (36.7 C) (Oral)   Resp 16   SpO2 98%   Visual Acuity Right Eye Distance:   Left Eye Distance:   Bilateral Distance:    Right Eye Near:   Left Eye Near:    Bilateral Near:     Physical Exam General appearance: Alert, Ill-appearing, no distress Head: Normocephalic, without obvious abnormality, atraumatic ENT: Ears congestion (right), (left ear normal) mucosal edema, congestion, oropharynx w/o exudate Respiratory: Respirations even , unlabored, coarse lung sound,  wheeze bilateral lung fields Heart: Rate and rhythm normal. No gallop or murmurs noted on exam  Extremities: No gross deformities Skin: Skin color, texture,  turgor normal. No rashes seen  Psych: Appropriate mood and affect. Neurologic: GCS 15, normal coordination normal gait UC Treatments / Results  Labs (all labs ordered are listed, but only abnormal results are displayed) Labs Reviewed - No data to display  EKG   Radiology No results found.  Procedures Procedures (including critical care time)  Medications Ordered in UC Medications  albuterol (VENTOLIN HFA) 108 (90 Base) MCG/ACT inhaler 2 puff (2 puffs Inhalation Given 10/16/20 1340)    Initial Impression / Assessment and Plan / UC Course  I have reviewed the triage vital signs and the nursing notes.  Pertinent labs & imaging results that were available during my care of the patient were reviewed by me and considered in my medical decision making (see chart for details).    Reactive airway disease with acute exacerbation Treatment per discharge instructions. ER precautions if any breathing symptoms worsen or do not improve. Albuterol inhaler dispensed here from clinic. Complete all medications as prescribed. RTC as needed   Final Clinical Impressions(s) / UC Diagnoses   Final diagnoses:  Reactive airway disease with acute exacerbation, unspecified asthma severity, unspecified whether persistent   Discharge Instructions   None    ED Prescriptions     Medication Sig Dispense Auth. Provider   albuterol (VENTOLIN HFA) 108 (90 Base) MCG/ACT inhaler Inhale 1-2 puffs into the lungs every 6 (six) hours as needed for wheezing or shortness of breath. 1 each Bing Neighbors, FNP   predniSONE (DELTASONE) 20 MG tablet Take 2 tablets (40 mg total) by mouth daily with breakfast. 10 tablet Bing Neighbors, FNP   benzonatate (TESSALON) 100 MG capsule Take 2 capsules (200 mg total) by mouth 3 (three) times daily as needed for cough. 40 capsule Bing Neighbors, FNP   azithromycin (ZITHROMAX) 250 MG tablet Take 2 tabs PO x 1 dose, then 1 tab PO QD x 4 days 6 tablet Bing Neighbors, FNP      PDMP not reviewed this encounter.   Bing Neighbors, FNP 10/16/20 1350

## 2020-10-16 NOTE — ED Triage Notes (Signed)
Patient presents to Urgent Care with complaints of cough x 4 days and chest congestion since yesterday. Treating symptoms with OTC and zyrtec meds.   Denies fever.

## 2020-10-18 ENCOUNTER — Other Ambulatory Visit: Payer: Self-pay

## 2020-10-18 ENCOUNTER — Encounter: Payer: Self-pay | Admitting: Women's Health

## 2020-10-18 ENCOUNTER — Ambulatory Visit (INDEPENDENT_AMBULATORY_CARE_PROVIDER_SITE_OTHER): Payer: 59 | Admitting: Women's Health

## 2020-10-18 VITALS — BP 122/79 | HR 82 | Ht 62.0 in | Wt 193.4 lb

## 2020-10-18 DIAGNOSIS — R300 Dysuria: Secondary | ICD-10-CM | POA: Diagnosis not present

## 2020-10-18 DIAGNOSIS — N390 Urinary tract infection, site not specified: Secondary | ICD-10-CM | POA: Diagnosis not present

## 2020-10-18 LAB — POCT URINALYSIS DIPSTICK OB
Glucose, UA: NEGATIVE
Ketones, UA: NEGATIVE
Leukocytes, UA: NEGATIVE
Nitrite, UA: NEGATIVE
POC,PROTEIN,UA: NEGATIVE

## 2020-10-18 NOTE — Progress Notes (Signed)
   GYN VISIT Patient name: Nicole Freeman MRN [id number]  Date of birth: [birth year] Chief Complaint:   discuss frequent uti (Symptoms uti after period)  History of Present Illness:   Nicole Freeman is a 25 y.o. G3P2002 Caucasian female being seen today for report of uti sx before and after period every month. Starts burning prior to period, eases up when on period, then gets worse after period. Sx only bad enough to seek antibiotics app q 2-36mths, but present monthly. Denies abnormal discharge, itching/odor/irritation.  Her grandmother has an abnormal kidney, so she is concerned about this.  On azithromycin for URI.  Patient's last menstrual period was 10/18/2020. The current method of family planning is none, trying to conceive Last pap 06/13/20. Results were: NILM w/ HRHPV negative  Depression screen [facility] 2/9 07/21/2020 05/12/2018  Decreased Interest 0 0  Down, Depressed, Hopeless 0 0  PHQ - 2 Score 0 0  Altered sleeping - 0  Tired, decreased energy - 0  Change in appetite - 0  Feeling bad or failure about yourself  - 0  Trouble concentrating - 0  Moving slowly or fidgety/restless - 0  Suicidal thoughts - 0  PHQ-9 Score - 0    No flowsheet data found.   Review of Systems:   Pertinent items are noted in HPI Denies fever/chills, dizziness, headaches, visual disturbances, fatigue, shortness of breath, chest pain, abdominal pain, vomiting, abnormal vaginal discharge/itching/odor/irritation, problems with periods, bowel movements, urination, or intercourse unless otherwise stated above.  Pertinent History Reviewed:  Reviewed past medical,surgical, social, obstetrical and family history.  Reviewed problem list, medications and allergies. Physical Assessment:   Vitals:   10/18/20 1016  BP: 122/79  Pulse: 82  Weight: 193 lb 6.4 oz (87.7 kg)  Height: 5\' 2"  (1.575 m)  Body mass index is 35.37 kg/m.       Physical Examination:   General appearance: alert, well  appearing, and in no distress  Mental status: alert, oriented to person, place, and time  Skin: warm & dry   Cardiovascular: normal heart rate noted  Respiratory: normal respiratory effort, no distress  Abdomen: soft, non-tender   Pelvic: examination not indicated  Extremities: no edema   Chaperone: N/A    Results for orders placed or performed in visit on 10/18/20 (from the past 24 hour(s))  POC Urinalysis Dipstick OB   Collection Time: 10/18/20 10:37 AM  Result Value Ref Range   Color, UA     Clarity, UA     Glucose, UA Negative Negative   Bilirubin, UA     Ketones, UA neg    Spec Grav, UA     Blood, UA small    pH, UA     POC,PROTEIN,UA Negative Negative, Trace, Small (1+), Moderate (2+), Large (3+), 4+   Urobilinogen, UA     Nitrite, UA neg    Leukocytes, UA Negative Negative   Appearance     Odor      Assessment & Plan:  1) UTI sx before and after period monthly> started period today, send urine cx. Will get renal u/s and refer to uro/gyn  Meds: No orders of the defined types were placed in this encounter.   Orders Placed This Encounter  Procedures   Urine Culture   10/20/20 RENAL   Ambulatory referral to Urogynecology   POC Urinalysis Dipstick OB    Return for prn.  [place] CNM, [facility] 10/18/2020 10:53 AM

## 2020-10-19 NOTE — Progress Notes (Signed)
NEUROLOGY FOLLOW UP OFFICE NOTE  Nicole Freeman [id number]  Assessment/Plan:   1.  Migraine without aura, without status migrainosus, not intractable, improved off of birth control.  I believe current headaches represent migraines and not a symptom of idiopathic intracranial hypertension.  Semiology consistent with migraines.  Eye exam has not indicated true papilledema. 2. Pseudopapilledema  3.  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  Her Chiari is borderline.  There is no abnormal morphology of the cerebellum, brainstem and upper cervical spinal cord.  Migraine prevention:  As she is still trying to get pregnant, will treat migraines with a medication that would be potentially safe in pregnancy.  Verapamil CR 120mg  daily.  Of course, if she should become pregnant, her migraines would more likely improve anyway and she can discontinue it. Limit use of pain relievers to no more than 2 days out of week to prevent risk of rebound or medication-overuse headache. Keep headache diary Follow up 6 months.   Subjective:  Nicole Freeman is a 25 year old right-handed female with von Willebrand disease who follows up for headaches.   UPDATE: Patient and her husband started trying to have a child, so topiramate was discontinued and she was started on magnesium oxide 400mg  and riboflavin 400mg  daily but then subsequently stopped because she felt they may have made headaches worse.  However, once she stopped her birth control, the headaches have significantly improved.  They were occurring every 2 weeks.  About 2 1/2 months ago, they started becoming more frequent again.  She has been on prednisone for asthma-induced cough.  No headaches while on prednisone.  However, when she is not taking prednisone, they are occurring every 2 to 3 days and lasting 30-60 minutes with rest and dimenhydrinate.  She saw her ophthalmologist, Dr. 30,  on 9/9.  Pseudopapilledema.  Hyperemic nerves but no disc edema, no HVF defects and RNFL scans stable.  She is still actively trying to get pregnant.      HISTORY:  She started having migraines in 2019 when she became pregnant with her second child.  She had gained about 60 lbs.  After her second child, she had an IUD implanted.  They occurred every other week and then gradually increased in frequency.  Around April or May 2021, headaches became daily.  She endorses severe bitemporal/top of head pounding pain associated with nausea, photophobia, phonophobia, double vision/blurred vision.  They would last until she would lay down in a cool dark room.  Se was taking tylenol or naproxen almost daily.  She also endorsed pulsatile tinnitus.  On a couple of occasions, she noted transient darkening of vision.  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  She was referred to a neurologist in [place].  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  She was started topiramate.  Headaches had almost resolved.  She also lost 10 lbs.  She was referred to ophthalmology, Dr. June this month who diagnosed pseudopapilledema noting elevated RNFL with hyperemic nerves but no disc edema or HVF defects.  She continues to take topiramate 25mg  at bedtime.  Headaches are controlled on it.  However, she plans to start trying for another baby.  She also reports intermittent numbness and tingling in hands and feet  for the past couple of years which has since gotten worse over past few months.  Her previous neurologist told her that it was likely secondary to the Chiari.  PAST MEDICAL HISTORY: Past Medical History:  Diagnosis Date   Chiari malformation type I (HCC)    Von Willebrand disease (HCC)     MEDICATIONS: Current  Outpatient Medications on File Prior to Visit  Medication Sig Dispense Refill   albuterol (VENTOLIN HFA) 108 (90 Base) MCG/ACT inhaler Inhale 1-2 puffs into the lungs every 6 (six) hours as needed for wheezing or shortness of breath. 1 each 0   azithromycin (ZITHROMAX) 250 MG tablet Take 2 tabs PO x 1 dose, then 1 tab PO QD x 4 days 6 tablet 0   benzonatate (TESSALON) 100 MG capsule Take 2 capsules (200 mg total) by mouth 3 (three) times daily as needed for cough. 40 capsule 0   nitrofurantoin, macrocrystal-monohydrate, (MACROBID) 100 MG capsule Take 1 capsule (100 mg total) by mouth 2 (two) times daily. X 7 days 14 capsule 0   predniSONE (DELTASONE) 20 MG tablet Take 2 tablets (40 mg total) by mouth daily with breakfast. 10 tablet 0   No current facility-administered medications on file prior to visit.    ALLERGIES: No Known Allergies  FAMILY HISTORY: Family History  Problem Relation Age of Onset   Other Maternal Grandmother        malformed kidney   Breast cancer Paternal Aunt    Lung cancer Paternal Aunt       Objective:  Blood pressure 109/76, pulse 93, height 5\' 2"  (1.575 m), weight 191 lb (86.6 kg), last menstrual period 10/18/2020, SpO2 95 %. General: No acute distress.  Patient appears well-groomed.     10/20/2020, DO   CC:  Shon Millet, CNM

## 2020-10-20 ENCOUNTER — Other Ambulatory Visit: Payer: Self-pay

## 2020-10-20 ENCOUNTER — Ambulatory Visit (INDEPENDENT_AMBULATORY_CARE_PROVIDER_SITE_OTHER): Payer: 59 | Admitting: Neurology

## 2020-10-20 VITALS — BP 109/76 | HR 93 | Ht 62.0 in | Wt 191.0 lb

## 2020-10-20 DIAGNOSIS — G43009 Migraine without aura, not intractable, without status migrainosus: Secondary | ICD-10-CM | POA: Diagnosis not present

## 2020-10-20 DIAGNOSIS — H47333 Pseudopapilledema of optic disc, bilateral: Secondary | ICD-10-CM | POA: Diagnosis not present

## 2020-10-20 MED ORDER — VERAPAMIL HCL ER 120 MG PO TBCR: 120.0000 mg | EXTENDED_RELEASE_TABLET | Freq: Every day | ORAL | 5 refills | Status: DC

## 2020-10-20 NOTE — Patient Instructions (Signed)
Start verapamil 120mg  daily Limit use of pain relievers to no more than 2 days out of week to prevent risk of rebound or medication-overuse headache. Follow up in 6 months or sooner if needed

## 2020-10-25 LAB — URINE CULTURE

## 2020-10-25 MED ORDER — NITROFURANTOIN MONOHYD MACRO 100 MG PO CAPS: 100.0000 mg | ORAL_CAPSULE | Freq: Two times a day (BID) | ORAL | 0 refills | Status: DC

## 2020-10-25 NOTE — Addendum Note (Signed)
Addended by: Cheral Marker on: 10/25/2020 09:07 AM   Modules accepted: Orders

## 2020-12-12 ENCOUNTER — Ambulatory Visit: Payer: 59 | Admitting: Obstetrics and Gynecology

## 2020-12-15 ENCOUNTER — Ambulatory Visit (INDEPENDENT_AMBULATORY_CARE_PROVIDER_SITE_OTHER): Payer: 59 | Admitting: Obstetrics and Gynecology

## 2020-12-15 ENCOUNTER — Other Ambulatory Visit: Payer: Self-pay

## 2020-12-15 ENCOUNTER — Encounter: Payer: Self-pay | Admitting: Obstetrics and Gynecology

## 2020-12-15 VITALS — BP 117/79 | HR 88 | Ht 61.0 in | Wt 185.0 lb

## 2020-12-15 DIAGNOSIS — N390 Urinary tract infection, site not specified: Secondary | ICD-10-CM

## 2020-12-15 DIAGNOSIS — N393 Stress incontinence (female) (male): Secondary | ICD-10-CM | POA: Diagnosis not present

## 2020-12-15 LAB — POCT URINALYSIS DIPSTICK
Appearance: NORMAL
Bilirubin, UA: NEGATIVE
Blood, UA: NEGATIVE
Glucose, UA: NEGATIVE
Ketones, UA: NEGATIVE
Leukocytes, UA: NEGATIVE
Nitrite, UA: NEGATIVE
Protein, UA: NEGATIVE
Spec Grav, UA: 1.015 (ref 1.010–1.025)
Urobilinogen, UA: 0.2 E.U./dL
pH, UA: 5.5 (ref 5.0–8.0)

## 2020-12-15 MED ORDER — NITROFURANTOIN MONOHYD MACRO 100 MG PO CAPS: 100.0000 mg | ORAL_CAPSULE | Freq: Every day | ORAL | 5 refills | Status: DC
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
## 2020-12-15 NOTE — Patient Instructions (Signed)
Take macrobid once a day for 6 months to prevent urinary tract infections.   Take cranberry pills over the counter to help with prevention.

## 2021-01-04 ENCOUNTER — Other Ambulatory Visit: Payer: Self-pay

## 2021-01-04 ENCOUNTER — Ambulatory Visit (INDEPENDENT_AMBULATORY_CARE_PROVIDER_SITE_OTHER): Payer: 59 | Admitting: *Deleted

## 2021-01-04 ENCOUNTER — Encounter: Payer: Self-pay | Admitting: *Deleted

## 2021-01-04 ENCOUNTER — Other Ambulatory Visit: Payer: Self-pay | Admitting: Adult Health

## 2021-01-04 VITALS — Ht 62.0 in | Wt 190.5 lb

## 2021-01-04 DIAGNOSIS — Z3201 Encounter for pregnancy test, result positive: Secondary | ICD-10-CM | POA: Diagnosis not present

## 2021-01-04 LAB — POCT URINE PREGNANCY: Preg Test, Ur: POSITIVE — AB

## 2021-01-04 MED ORDER — PROMETHAZINE HCL 25 MG PO TABS: 25.0000 mg | ORAL_TABLET | Freq: Four times a day (QID) | ORAL | 1 refills | Status: DC | PRN

## 2021-01-04 NOTE — Progress Notes (Signed)
Will rx phenergan  

## 2021-01-04 NOTE — Progress Notes (Signed)
Patient ID: THAYER INABINET, female   DOB: November 27, 1995, 25 y.o.   MRN: [id number] Chart reviewed for nurse visit. Agree with plan of care. Will rx phenergan  Adline Potter, NP 01/04/2021 11:46 AM

## 2021-01-04 NOTE — Progress Notes (Signed)
   NURSE VISIT- PREGNANCY CONFIRMATION   SUBJECTIVE:  Nicole Freeman is a 25 y.o. [id number] female at [redacted]w[redacted]d by certain LMP of Patient's last menstrual period was 11/28/2020 (exact date). Here for pregnancy confirmation.  Home pregnancy test: positive x 3.   She reports nausea.  She is taking prenatal vitamins.    OBJECTIVE:  Ht 5\' 2"  (1.575 m)   Wt 190 lb 8 oz (86.4 kg)   LMP 11/28/2020 (Exact Date)   BMI 34.84 kg/m   Appears well, in no apparent distress  Results for orders placed or performed in visit on 01/04/21 (from the past 24 hour(s))  POCT urine pregnancy   Collection Time: 01/04/21 11:33 AM  Result Value Ref Range   Preg Test, Ur Positive (A) Negative    ASSESSMENT: Positive pregnancy test, [redacted]w[redacted]d by LMP    PLAN: Schedule for dating ultrasound in 4 weeks Prenatal vitamins: continue   Nausea medicines: requested-note routed to [facility] to send prescription   OB packet given: Yes  [redacted]w[redacted]d  01/04/2021 11:39 AM

## 2021-01-10 ENCOUNTER — Other Ambulatory Visit: Payer: Self-pay | Admitting: Women's Health

## 2021-01-10 ENCOUNTER — Encounter: Payer: Self-pay | Admitting: Women's Health

## 2021-01-10 MED ORDER — BONJESTA 20-20 MG PO TBCR: 1.0000 | EXTENDED_RELEASE_TABLET | Freq: Every day | ORAL | 8 refills | Status: DC

## 2021-01-13 ENCOUNTER — Encounter: Payer: Self-pay | Admitting: Women's Health

## 2021-02-01 ENCOUNTER — Other Ambulatory Visit: Payer: Self-pay | Admitting: Adult Health

## 2021-02-01 DIAGNOSIS — O3680X0 Pregnancy with inconclusive fetal viability, not applicable or unspecified: Secondary | ICD-10-CM

## 2021-02-02 ENCOUNTER — Ambulatory Visit (INDEPENDENT_AMBULATORY_CARE_PROVIDER_SITE_OTHER): Payer: 59

## 2021-02-02 ENCOUNTER — Other Ambulatory Visit: Payer: Self-pay

## 2021-02-02 DIAGNOSIS — O3680X0 Pregnancy with inconclusive fetal viability, not applicable or unspecified: Secondary | ICD-10-CM | POA: Diagnosis not present

## 2021-02-02 DIAGNOSIS — Z3A09 9 weeks gestation of pregnancy: Secondary | ICD-10-CM

## 2021-02-02 NOTE — Progress Notes (Signed)
[place] 9+3 wks,single IUP,CRL 28.73 mm,FHR 171 bpm.normal ovaries

## 2021-02-05 NOTE — L&D Delivery Note (Addendum)
Delivery Note Nicole Freeman is a 26 y.o. female [phone] with IUP at [redacted]w[redacted]d admitted in active labor.  She progressed with/without augmentation to complete and pushed to deliver.    At 9:53 AM a viable female was delivered via Vaginal, Spontaneous (Presentation: Left Occiput Anterior).  APGAR: 9, 9; weight 7 lb 11.5 oz (3500 g).   Placenta status: Spontaneous, Intact.  Cord: 3 vessels with the following complications: None.  Cord pH: n/a Cord clamping delayed by several minutes then clamped by CNM and cut by support person.  Placenta intact and spontaneous, bleeding minimal. No laceration.  1g IV TXA given for prophylaxis after delivery of the placenta due to history of Von Willebrand's disease  Mom and baby stable prior to transfer to postpartum. She plans on breastfeeding. She requests post placental IUD for birth control, which was performed. Please see separate procedure note.  Anesthesia: Epidural Episiotomy: None Lacerations: None Suture Repair:  n/a Est. Blood Loss (mL): 100  Mom to postpartum.  Baby to Couplet care / Skin to Skin.  Plan is to continue PO TXA 1950g TID for 10-14 days post delivery  Delivery was proctored by Dr Ashok Pall.  Sheppard Evens MD MPH OB Fellow, Faculty [facility]

## 2021-02-09 ENCOUNTER — Encounter: Payer: Self-pay | Admitting: Women's Health

## 2021-02-22 ENCOUNTER — Other Ambulatory Visit: Payer: Self-pay | Admitting: Obstetrics & Gynecology

## 2021-02-22 DIAGNOSIS — Z3682 Encounter for antenatal screening for nuchal translucency: Secondary | ICD-10-CM

## 2021-02-23 ENCOUNTER — Ambulatory Visit: Payer: 59 | Admitting: *Deleted

## 2021-02-23 ENCOUNTER — Ambulatory Visit (INDEPENDENT_AMBULATORY_CARE_PROVIDER_SITE_OTHER): Payer: 59

## 2021-02-23 ENCOUNTER — Ambulatory Visit (INDEPENDENT_AMBULATORY_CARE_PROVIDER_SITE_OTHER): Payer: 59 | Admitting: Women's Health

## 2021-02-23 ENCOUNTER — Other Ambulatory Visit: Payer: Self-pay

## 2021-02-23 ENCOUNTER — Encounter: Payer: Self-pay | Admitting: Women's Health

## 2021-02-23 VITALS — BP 119/73 | HR 76 | Wt 179.4 lb

## 2021-02-23 DIAGNOSIS — Z3490 Encounter for supervision of normal pregnancy, unspecified, unspecified trimester: Secondary | ICD-10-CM | POA: Insufficient documentation

## 2021-02-23 DIAGNOSIS — Z3A12 12 weeks gestation of pregnancy: Secondary | ICD-10-CM

## 2021-02-23 DIAGNOSIS — D6800 Von Willebrand disease, unspecified: Secondary | ICD-10-CM

## 2021-02-23 DIAGNOSIS — N390 Urinary tract infection, site not specified: Secondary | ICD-10-CM

## 2021-02-23 DIAGNOSIS — Z3682 Encounter for antenatal screening for nuchal translucency: Secondary | ICD-10-CM

## 2021-02-23 DIAGNOSIS — Z3480 Encounter for supervision of other normal pregnancy, unspecified trimester: Secondary | ICD-10-CM

## 2021-02-23 DIAGNOSIS — G43909 Migraine, unspecified, not intractable, without status migrainosus: Secondary | ICD-10-CM | POA: Insufficient documentation

## 2021-02-23 DIAGNOSIS — Z3481 Encounter for supervision of other normal pregnancy, first trimester: Secondary | ICD-10-CM | POA: Diagnosis not present

## 2021-02-23 DIAGNOSIS — G935 Compression of brain: Secondary | ICD-10-CM | POA: Insufficient documentation

## 2021-02-23 LAB — POCT URINALYSIS DIPSTICK OB
Blood, UA: NEGATIVE
Glucose, UA: NEGATIVE
Ketones, UA: NEGATIVE
Leukocytes, UA: NEGATIVE
Nitrite, UA: NEGATIVE
POC,PROTEIN,UA: NEGATIVE

## 2021-02-23 MED ORDER — DOXYLAMINE-PYRIDOXINE 10-10 MG PO TBEC: DELAYED_RELEASE_TABLET | ORAL | 6 refills | Status: DC

## 2021-02-23 NOTE — Patient Instructions (Signed)
[place], thank you for choosing our office today! We appreciate the opportunity to meet your healthcare needs. You may receive a short survey by mail, e-mail, or through MyChart. If you are happy with your care we would appreciate if you could take just a few minutes to complete the survey questions. We read all of your comments and take your feedback very seriously. Thank you again for choosing our office.  [facility] ([address] [place], [place] [address]) Entrance C, located off of [address] Free 24/7 valet parking   Nausea & Vomiting Have saltine crackers or pretzels by your bed and eat a few bites before you raise your head out of bed in the morning Eat small frequent meals throughout the day instead of large meals Drink plenty of fluids throughout the day to stay hydrated, just don't drink a lot of fluids with your meals.  This can make your stomach fill up faster making you feel sick Do not brush your teeth right after you eat Products with real ginger are good for nausea, like ginger ale and ginger hard candy Make sure it says made with real ginger! Sucking on sour candy like lemon heads is also good for nausea If your prenatal vitamins make you nauseated, take them at night so you will sleep through the nausea Sea Bands If you feel like you need medicine for the nausea & vomiting please let us know If you are unable to keep any fluids or food down please let us know   Constipation Drink plenty of fluid, preferably water, throughout the day Eat foods high in fiber such as fruits, vegetables, and grains Exercise, such as walking, is a good way to keep your bowels regular Drink warm fluids, especially warm prune juice, or decaf coffee Eat a 1/2 cup of real oatmeal (not instant), 1/2 cup applesauce, and 1/2-1 cup warm prune juice every day If needed, you may take Colace (docusate sodium) stool softener  once or twice a day to help keep the stool soft.  If you still are having problems with constipation, you may take Miralax once daily as needed to help keep your bowels regular.   Home Blood Pressure Monitoring for Patients   Your provider has recommended that you check your blood pressure (BP) at least once a week at home. If you do not have a blood pressure cuff at home, one will be provided for you. Contact your provider if you have not received your monitor within 1 week.   Helpful Tips for Accurate Home Blood Pressure Checks  Don't smoke, exercise, or drink caffeine 30 minutes before checking your BP Use the restroom before checking your BP (a full bladder can raise your pressure) Relax in a comfortable upright chair Feet on the ground Left arm resting comfortably on a flat surface at the level of your heart Legs uncrossed Back supported Sit quietly and don't talk Place the cuff on your bare arm Adjust snuggly, so that only two fingertips can fit between your skin and the top of the cuff Check 2 readings separated by at least one minute Keep a log of your BP readings For a visual, please reference this diagram: http://ccnc.care/bpdiagram  Provider Name: [facility]     Phone: [phone]  Zone 1: ALL CLEAR  Continue to monitor your symptoms:  BP reading is less than 140 (top number) or less than 90 (bottom   number)  No right upper stomach pain No headaches or seeing spots No feeling nauseated or throwing up No swelling in face and hands  Zone 2: CAUTION Call your doctor's office for any of the following:  BP reading is greater than 140 (top number) or greater than 90 (bottom number)  Stomach pain under your ribs in the middle or right side Headaches or seeing spots Feeling nauseated or throwing up Swelling in face and hands  Zone 3: EMERGENCY  Seek immediate medical care if you have any of the following:  BP reading is greater than160 (top number) or greater than  110 (bottom number) Severe headaches not improving with Tylenol Serious difficulty catching your breath Any worsening symptoms from Zone 2    First Trimester of Pregnancy The first trimester of pregnancy is from week 1 until the end of week 12 (months 1 through 3). A week after a sperm fertilizes an egg, the egg will implant on the wall of the uterus. This embryo will begin to develop into a baby. Genes from you and your partner are forming the baby. The female genes determine whether the baby is a boy or a girl. At 6-8 weeks, the eyes and face are formed, and the heartbeat can be seen on ultrasound. At the end of 12 weeks, all the baby's organs are formed.  Now that you are pregnant, you will want to do everything you can to have a healthy baby. Two of the most important things are to get good prenatal care and to follow your health care provider's instructions. Prenatal care is all the medical care you receive before the baby's birth. This care will help prevent, find, and treat any problems during the pregnancy and childbirth. BODY CHANGES Your body goes through many changes during pregnancy. The changes vary from woman to woman.  You may gain or lose a couple of pounds at first. You may feel sick to your stomach (nauseous) and throw up (vomit). If the vomiting is uncontrollable, call your health care provider. You may tire easily. You may develop headaches that can be relieved by medicines approved by your health care provider. You may urinate more often. Painful urination may mean you have a bladder infection. You may develop heartburn as a result of your pregnancy. You may develop constipation because certain hormones are causing the muscles that push waste through your intestines to slow down. You may develop hemorrhoids or swollen, bulging veins (varicose veins). Your breasts may begin to grow larger and become tender. Your nipples may stick out more, and the tissue that surrounds them  (areola) may become darker. Your gums may bleed and may be sensitive to brushing and flossing. Dark spots or blotches (chloasma, mask of pregnancy) may develop on your face. This will likely fade after the baby is born. Your menstrual periods will stop. You may have a loss of appetite. You may develop cravings for certain kinds of food. You may have changes in your emotions from day to day, such as being excited to be pregnant or being concerned that something may go wrong with the pregnancy and baby. You may have more vivid and strange dreams. You may have changes in your hair. These can include thickening of your hair, rapid growth, and changes in texture. Some women also have hair loss during or after pregnancy, or hair that feels dry or thin. Your hair will most likely return to normal after your baby is born. WHAT TO EXPECT AT YOUR PRENATAL   VISITS During a routine prenatal visit: You will be weighed to make sure you and the baby are growing normally. Your blood pressure will be taken. Your abdomen will be measured to track your baby's growth. The fetal heartbeat will be listened to starting around week 10 or 12 of your pregnancy. Test results from any previous visits will be discussed. Your health care provider may ask you: How you are feeling. If you are feeling the baby move. If you have had any abnormal symptoms, such as leaking fluid, bleeding, severe headaches, or abdominal cramping. If you have any questions. Other tests that may be performed during your first trimester include: Blood tests to find your blood type and to check for the presence of any previous infections. They will also be used to check for low iron levels (anemia) and Rh antibodies. Later in the pregnancy, blood tests for diabetes will be done along with other tests if problems develop. Urine tests to check for infections, diabetes, or protein in the urine. An ultrasound to confirm the proper growth and development  of the baby. An amniocentesis to check for possible genetic problems. Fetal screens for spina bifida and Down syndrome. You may need other tests to make sure you and the baby are doing well. HOME CARE INSTRUCTIONS  Medicines Follow your health care provider's instructions regarding medicine use. Specific medicines may be either safe or unsafe to take during pregnancy. Take your prenatal vitamins as directed. If you develop constipation, try taking a stool softener if your health care provider approves. Diet Eat regular, well-balanced meals. Choose a variety of foods, such as meat or vegetable-based protein, fish, milk and low-fat dairy products, vegetables, fruits, and whole grain breads and cereals. Your health care provider will help you determine the amount of weight gain that is right for you. Avoid raw meat and uncooked cheese. These carry germs that can cause birth defects in the baby. Eating four or five small meals rather than three large meals a day may help relieve nausea and vomiting. If you start to feel nauseous, eating a few soda crackers can be helpful. Drinking liquids between meals instead of during meals also seems to help nausea and vomiting. If you develop constipation, eat more high-fiber foods, such as fresh vegetables or fruit and whole grains. Drink enough fluids to keep your urine clear or pale yellow. Activity and Exercise Exercise only as directed by your health care provider. Exercising will help you: Control your weight. Stay in shape. Be prepared for labor and delivery. Experiencing pain or cramping in the lower abdomen or low back is a good sign that you should stop exercising. Check with your health care provider before continuing normal exercises. Try to avoid standing for long periods of time. Move your legs often if you must stand in one place for a long time. Avoid heavy lifting. Wear low-heeled shoes, and practice good posture. You may continue to have sex  unless your health care provider directs you otherwise. Relief of Pain or Discomfort Wear a good support bra for breast tenderness.   Take warm sitz baths to soothe any pain or discomfort caused by hemorrhoids. Use hemorrhoid cream if your health care provider approves.   Rest with your legs elevated if you have leg cramps or low back pain. If you develop varicose veins in your legs, wear support hose. Elevate your feet for 15 minutes, 3-4 times a day. Limit salt in your diet. Prenatal Care Schedule your prenatal visits by the   twelfth week of pregnancy. They are usually scheduled monthly at first, then more often in the last 2 months before delivery. Write down your questions. Take them to your prenatal visits. Keep all your prenatal visits as directed by your health care provider. Safety Wear your seat belt at all times when driving. Make a list of emergency phone numbers, including numbers for family, friends, the hospital, and police and fire departments. General Tips Ask your health care provider for a referral to a local prenatal education class. Begin classes no later than at the beginning of month 6 of your pregnancy. Ask for help if you have counseling or nutritional needs during pregnancy. Your health care provider can offer advice or refer you to specialists for help with various needs. Do not use hot tubs, steam rooms, or saunas. Do not douche or use tampons or scented sanitary pads. Do not cross your legs for long periods of time. Avoid cat litter boxes and soil used by cats. These carry germs that can cause birth defects in the baby and possibly loss of the fetus by miscarriage or stillbirth. Avoid all smoking, herbs, alcohol, and medicines not prescribed by your health care provider. Chemicals in these affect the formation and growth of the baby. Schedule a dentist appointment. At home, brush your teeth with a soft toothbrush and be gentle when you floss. SEEK MEDICAL CARE IF:   You have dizziness. You have mild pelvic cramps, pelvic pressure, or nagging pain in the abdominal area. You have persistent nausea, vomiting, or diarrhea. You have a bad smelling vaginal discharge. You have pain with urination. You notice increased swelling in your face, hands, legs, or ankles. SEEK IMMEDIATE MEDICAL CARE IF:  You have a fever. You are leaking fluid from your vagina. You have spotting or bleeding from your vagina. You have severe abdominal cramping or pain. You have rapid weight gain or loss. You vomit blood or material that looks like coffee grounds. You are exposed to German measles and have never had them. You are exposed to fifth disease or chickenpox. You develop a severe headache. You have shortness of breath. You have any kind of trauma, such as from a fall or a car accident. Document Released: 01/16/2001 Document Revised: 06/08/2013 Document Reviewed: 12/02/2012 ExitCare Patient Information 2015 [employer]. This information is not intended to replace advice given to you by your health care provider. Make sure you discuss any questions you have with your health care provider.  

## 2021-02-23 NOTE — Progress Notes (Signed)
INITIAL OBSTETRICAL VISIT Patient name: Nicole Freeman MRN [id number]  Date of birth: Apr 07, 1995 Chief Complaint:   Initial Prenatal Visit (Upper abdominal sharp pains)  History of Present Illness:   Nicole Freeman is a 26 y.o. G70P2002 Caucasian female at [redacted]w[redacted]d by LMP c/w u/s at 9 weeks with an Estimated Date of Delivery: 09/04/21 being seen today for her initial obstetrical visit.   Patient's last menstrual period was 11/28/2020 (exact date). 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 Wants rx for diclegis, has bonjesta and works well, but had to pay $500 (had flexible spending card, so wasn't an issue at the time). On verapamil for migraines- per neurologist is ok during pregnancy.    Today she reports  had some upper abd pain the other day .  Last pap 06/13/20. Results were: NILM w/ HRHPV negative  Depression screen [facility] 2/9 02/23/2021 07/21/2020 05/12/2018  Decreased Interest 0 0 0  Down, Depressed, Hopeless 0 0 0  PHQ - 2 Score 0 0 0  Altered sleeping 0 - 0  Tired, decreased energy 0 - 0  Change in appetite 0 - 0  Feeling bad or failure about yourself  0 - 0  Trouble concentrating 0 - 0  Moving slowly or fidgety/restless 0 - 0  Suicidal thoughts 0 - 0  PHQ-9 Score 0 - 0     GAD 7 : Generalized Anxiety Score 02/23/2021  Nervous, Anxious, on Edge 0  Control/stop worrying 0  Worry too much - different things 0  Trouble relaxing 0  Restless 0  Easily annoyed or irritable 1  Afraid - awful might happen 0  Total GAD 7 Score 1     Review of Systems:   Pertinent items are noted in HPI Denies cramping/contractions, leakage of fluid, vaginal bleeding, abnormal vaginal discharge w/ itching/odor/irritation, headaches, visual changes, shortness of breath, chest pain, abdominal pain, severe nausea/vomiting, or problems with urination or bowel movements unless  otherwise stated above.  Pertinent History Reviewed:  Reviewed past medical,surgical, social, obstetrical and family history.  Reviewed problem list, medications and allergies. OB History  Gravida Para Term Preterm AB Living  3 2 2     2   SAB IAB Ectopic Multiple Live Births        0 2    # Outcome Date GA Lbr Len/2nd Weight Sex Delivery Anes PTL Lv  3 Current           2 Term 01/08/18 [redacted]w[redacted]d 06:36 / 00:55 8 lb 3.4 oz (3.725 kg) M Vag-Spont EPI N LIV  1 Term 11/26/11 [redacted]w[redacted]d  5 lb 5 oz (2.41 kg) M Vag-Spont EPI N LIV   Physical Assessment:   Vitals:   02/23/21 0908 02/23/21 1143  BP: 131/67 119/73  Pulse: 81 76  Weight: 179 lb 6.4 oz (81.4 kg)   Body mass index is 32.81 kg/m.       Physical Examination:  General appearance - well appearing, and in no distress  Mental status - alert, oriented to person, place, and time  Psych:  She has a normal mood and affect  Skin - warm and dry, normal color, no suspicious lesions noted  Chest - effort normal, all lung fields clear to auscultation bilaterally  Heart - normal rate and regular rhythm  Abdomen - soft, nontender  Extremities:  No swelling or varicosities noted  Thin prep pap  is not done   Chaperone: N/A    TODAY'S NT [place] 12+3 wks,measurements c/w dates,CRL 69.86 mm,NB present,NT 1.6 mm,normal left ovary,right ovary not visualized,FHR 151 bpm  Results for orders placed or performed in visit on 02/23/21 (from the past 24 hour(s))  POC Urinalysis Dipstick OB   Collection Time: 02/23/21 12:31 PM  Result Value Ref Range   Color, UA     Clarity, UA     Glucose, UA Negative Negative   Bilirubin, UA     Ketones, UA neg    Spec Grav, UA     Blood, UA neg    pH, UA     POC,PROTEIN,UA Negative Negative, Trace, Small (1+), Moderate (2+), Large (3+), 4+   Urobilinogen, UA     Nitrite, UA neg    Leukocytes, UA Negative Negative   Appearance     Odor      Assessment & Plan:  1) Low-Risk Pregnancy G3P2002 at [redacted]w[redacted]d with an  Estimated Date of Delivery: 09/04/21   2) Initial OB visit  3) Von Willebrands disease> discussed w/ LHE, will check Factor 8 activity today and again at 36wks. [facility]APT available at [facility] if needed for delivery  4) Verapamil for migraine prophylaxis> per UTD does cross placenta, CCBs can be used during pregnancy although others are more common, discussed w/ LHE, prefers she not be on it but if needed is ok. Discussed w/ pt migraines may improve during pregnancy, pt doesn't want to take the chance coming off   Meds:  Meds ordered this encounter  Medications   Doxylamine-Pyridoxine (DICLEGIS) 10-10 MG TBEC    Sig: 2 tabs q hs, if sx persist add 1 tab q am on day 3, if sx persist add 1 tab q afternoon on day 4    Dispense:  100 tablet    Refill:  6    Order Specific Question:   Supervising Provider    Answer:   Duane Lope H [[id number]]    Initial labs obtained Continue prenatal vitamins Reviewed n/v relief measures and warning s/s to report Reviewed recommended weight gain based on pre-gravid BMI Encouraged well-balanced diet Genetic & carrier screening discussed: requests Panorama and NT/IT, declines Horizon  Ultrasound discussed; fetal survey: requested CCNC completed> form faxed if has or is planning to apply for medicaid The nature of [facility] [employer] with multiple MDs and other Advanced Practice Providers was explained to patient; also emphasized that fellows, residents, and students are part of our team.  Follow-up: Return in about 4 weeks (around 03/23/2021) for LROB, 2nd IT, CNM, in person.   Orders Placed This Encounter  Procedures   Urine Culture   GC/Chlamydia Probe Amp   Integrated 1   Genetic Screening   CBC/D/Plt+RPR+Rh+ABO+RubIgG...   Hgb Fractionation Cascade   Factor 8 ristocetin cofactor   POC Urinalysis Dipstick OB    Cheral Marker CNM, [facility] 02/23/2021 12:36 PM

## 2021-02-23 NOTE — Progress Notes (Signed)
[place] 12+3 wks,measurements c/w dates,CRL 69.86 mm,NB present,NT 1.6 mm,normal left ovary,right ovary not visualized,FHR 151 bpm

## 2021-02-25 LAB — GC/CHLAMYDIA PROBE AMP
Chlamydia trachomatis, NAA: NEGATIVE
Neisseria Gonorrhoeae by PCR: NEGATIVE

## 2021-02-25 LAB — URINE CULTURE: Organism ID, Bacteria: NO GROWTH

## 2021-02-26 LAB — FACTOR 8 RISTOCETIN COFACTOR: Von Willebrand Factor: 122 % (ref 50–200)

## 2021-02-27 LAB — CBC/D/PLT+RPR+RH+ABO+RUBIGG...
Antibody Screen: NEGATIVE
Basophils Absolute: 0 10*3/uL (ref 0.0–0.2)
Basos: 0 %
EOS (ABSOLUTE): 0.1 10*3/uL (ref 0.0–0.4)
Eos: 1 %
HCV Ab: <0.1 s/co ratio (ref 0.0–0.9)
HIV Screen 4th Generation wRfx: NONREACTIVE
Hematocrit: 37.5 % (ref 34.0–46.6)
Hemoglobin: 12.5 g/dL (ref 11.1–15.9)
Hepatitis B Surface Ag: NEGATIVE
Immature Grans (Abs): 0 10*3/uL (ref 0.0–0.1)
Immature Granulocytes: 0 %
Lymphocytes Absolute: 1.2 10*3/uL (ref 0.7–3.1)
Lymphs: 16 %
MCH: 31.5 pg (ref 26.6–33.0)
MCHC: 33.3 g/dL (ref 31.5–35.7)
MCV: 95 fL (ref 79–97)
Monocytes Absolute: 0.3 10*3/uL (ref 0.1–0.9)
Monocytes: 4 %
Neutrophils Absolute: 6 10*3/uL (ref 1.4–7.0)
Neutrophils: 79 %
Platelets: 292 10*3/uL (ref 150–450)
RBC: 3.97 x10E6/uL (ref 3.77–5.28)
RDW: 12.1 % (ref 11.7–15.4)
RPR Ser Ql: NONREACTIVE
Rh Factor: POSITIVE
Rubella Antibodies, IGG: 1.94 index (ref >0.99)
WBC: 7.6 10*3/uL (ref 3.4–10.8)

## 2021-02-27 LAB — INTEGRATED 1
Crown Rump Length: 69.9 mm
Gest. Age on Collection Date: 13 weeks
Maternal Age at EDD: 27.1 yr
Nuchal Translucency (NT): 1.6 mm
Number of Fetuses: 1
PAPP-A Value: 2069 ng/mL
Weight: 179 [lb_av]

## 2021-02-27 LAB — HGB FRACTIONATION CASCADE
Hgb A2: 3 % (ref 1.8–3.2)
Hgb A: 97 % (ref 96.4–98.8)
Hgb F: 0 % (ref 0.0–2.0)
Hgb S: 0 %

## 2021-02-27 LAB — HCV INTERPRETATION

## 2021-03-01 ENCOUNTER — Encounter: Payer: Self-pay | Admitting: Women's Health

## 2021-03-06 ENCOUNTER — Encounter: Payer: Self-pay | Admitting: Women's Health

## 2021-03-17 ENCOUNTER — Inpatient Hospital Stay (HOSPITAL_COMMUNITY)
Admission: AD | Admit: 2021-03-17 | Discharge: 2021-03-17 | Disposition: A | Discharge status: Home / Self Care | Payer: 59 | Attending: Obstetrics and Gynecology | Admitting: Obstetrics and Gynecology

## 2021-03-17 ENCOUNTER — Encounter (HOSPITAL_COMMUNITY): Payer: Self-pay | Admitting: Obstetrics and Gynecology

## 2021-03-17 ENCOUNTER — Other Ambulatory Visit: Payer: Self-pay | Admitting: Adult Health

## 2021-03-17 ENCOUNTER — Encounter: Payer: Self-pay | Admitting: Women's Health

## 2021-03-17 ENCOUNTER — Other Ambulatory Visit: Payer: Self-pay

## 2021-03-17 DIAGNOSIS — R519 Headache, unspecified: Secondary | ICD-10-CM | POA: Insufficient documentation

## 2021-03-17 DIAGNOSIS — R197 Diarrhea, unspecified: Secondary | ICD-10-CM | POA: Insufficient documentation

## 2021-03-17 DIAGNOSIS — K529 Noninfective gastroenteritis and colitis, unspecified: Secondary | ICD-10-CM

## 2021-03-17 DIAGNOSIS — Z3A15 15 weeks gestation of pregnancy: Secondary | ICD-10-CM | POA: Insufficient documentation

## 2021-03-17 DIAGNOSIS — O26892 Other specified pregnancy related conditions, second trimester: Secondary | ICD-10-CM | POA: Diagnosis not present

## 2021-03-17 DIAGNOSIS — Z20822 Contact with and (suspected) exposure to covid-19: Secondary | ICD-10-CM | POA: Insufficient documentation

## 2021-03-17 DIAGNOSIS — O218 Other vomiting complicating pregnancy: Secondary | ICD-10-CM | POA: Insufficient documentation

## 2021-03-17 DIAGNOSIS — M62830 Muscle spasm of back: Secondary | ICD-10-CM | POA: Insufficient documentation

## 2021-03-17 LAB — BASIC METABOLIC PANEL
Anion gap: 11 (ref 5–15)
BUN: 14 mg/dL (ref 6–20)
CO2: 19 mmol/L — ABNORMAL LOW (ref 22–32)
Calcium: 9.1 mg/dL (ref 8.9–10.3)
Chloride: 104 mmol/L (ref 98–111)
Creatinine, Ser: 0.7 mg/dL (ref 0.44–1.00)
GFR, Estimated: >60 mL/min (ref >60)
Glucose, Bld: 95 mg/dL (ref 70–99)
Potassium: 3.8 mmol/L (ref 3.5–5.1)
Sodium: 134 mmol/L — ABNORMAL LOW (ref 135–145)

## 2021-03-17 LAB — CBC
HCT: 35 % — ABNORMAL LOW (ref 36.0–46.0)
Hemoglobin: 12 g/dL (ref 12.0–15.0)
MCH: 32.1 pg (ref 26.0–34.0)
MCHC: 34.3 g/dL (ref 30.0–36.0)
MCV: 93.6 fL (ref 80.0–100.0)
Platelets: 239 10*3/uL (ref 150–400)
RBC: 3.74 MIL/uL — ABNORMAL LOW (ref 3.87–5.11)
RDW: 12.5 % (ref 11.5–15.5)
WBC: 9.4 10*3/uL (ref 4.0–10.5)
nRBC: 0 % (ref 0.0–0.2)

## 2021-03-17 LAB — URINALYSIS, ROUTINE W REFLEX MICROSCOPIC
Bacteria, UA: NONE SEEN
Bilirubin Urine: NEGATIVE
Glucose, UA: NEGATIVE mg/dL
Hgb urine dipstick: NEGATIVE
Ketones, ur: 80 mg/dL — AB
Leukocytes,Ua: NEGATIVE
Nitrite: NEGATIVE
Protein, ur: 30 mg/dL — AB
Specific Gravity, Urine: 1.03 (ref 1.005–1.030)
pH: 5 (ref 5.0–8.0)

## 2021-03-17 LAB — RESP PANEL BY RT-PCR (FLU A&B, COVID) ARPGX2
Influenza A by PCR: NEGATIVE
Influenza B by PCR: NEGATIVE
SARS Coronavirus 2 by RT PCR: NEGATIVE

## 2021-03-17 MED ORDER — PYRIDOXINE HCL 100 MG/ML IJ SOLN
100.0000 mg | Freq: Once | INTRAMUSCULAR | Status: AC
  Administered 2021-03-17: 100 mg via INTRAVENOUS
  Filled 2021-03-17: qty 1

## 2021-03-17 MED ORDER — CYCLOBENZAPRINE HCL 10 MG PO TABS: 10.0000 mg | ORAL_TABLET | Freq: Three times a day (TID) | ORAL | 0 refills | Status: DC | PRN

## 2021-03-17 MED ORDER — FENTANYL CITRATE (PF) 100 MCG/2ML IJ SOLN
50.0000 ug | Freq: Once | INTRAMUSCULAR | Status: AC
  Administered 2021-03-17: 50 ug via INTRAVENOUS
  Filled 2021-03-17: qty 2

## 2021-03-17 MED ORDER — LACTATED RINGERS IV SOLN: Freq: Once | INTRAVENOUS | Status: AC

## 2021-03-17 MED ORDER — ONDANSETRON 4 MG PO TBDP: 4.0000 mg | ORAL_TABLET | Freq: Four times a day (QID) | ORAL | 0 refills | Status: DC | PRN

## 2021-03-17 MED ORDER — CYCLOBENZAPRINE HCL 5 MG PO TABS
10.0000 mg | ORAL_TABLET | Freq: Once | ORAL | Status: AC
  Administered 2021-03-17: 10 mg via ORAL
  Filled 2021-03-17: qty 2

## 2021-03-17 MED ORDER — ONDANSETRON HCL 4 MG/2ML IJ SOLN
4.0000 mg | Freq: Once | INTRAMUSCULAR | Status: AC
  Administered 2021-03-17: 4 mg via INTRAVENOUS
  Filled 2021-03-17: qty 2

## 2021-03-17 NOTE — Progress Notes (Signed)
Will rx flexeril  

## 2021-03-17 NOTE — MAU Note (Signed)
Started have n/v about 1700. 5-6 loose stools since 86mn. Took Bonjesta at 0100 and vomited afterward but did not see the pill. No n/v since 0200. Denies VB. Some lower abd and lower back pain.

## 2021-03-17 NOTE — MAU Provider Note (Signed)
Chief Complaint:  Emesis During Pregnancy and Diarrhea   None    HPI: Nicole Freeman is a 26 y.o. G3P2002 at 29w4dwho presents to maternity admissions reporting nausea, vomiting and diarrhea.  Husband was sick earlier this week.  Also has headache and low back ache. She denies LOF, vaginal bleeding, vaginal itching/burning, urinary symptoms, dizziness, constipation or fever/chills.    Diarrhea  This is a new problem. The current episode started today. The problem has been unchanged. Associated symptoms include abdominal pain (crampy), headaches and vomiting. Pertinent negatives include no chills or fever. There are no known risk factors. Treatments tried: bonjesta. The treatment provided no relief.  Emesis  This is a new problem. The current episode started today. The problem has been unchanged. There has been no fever. Associated symptoms include abdominal pain (crampy), diarrhea and headaches. Pertinent negatives include no chills or fever. Risk factors include ill contacts.    RN Note: Started have n/v about 1700. 5-6 loose stools since 36mn. Took Bonjesta at 0100 and vomited afterward but did not see the pill. No n/v since 0200. Denies VB. Some lower abd and lower back pain  Past Medical History: Past Medical History:  Diagnosis Date   Chiari malformation type I (HCC)    Headache    Von Willebrand disease     Past obstetric history: OB History  Gravida Para Term Preterm AB Living  3 2 2     2   SAB IAB Ectopic Multiple Live Births        0 2    # Outcome Date GA Lbr Len/2nd Weight Sex Delivery Anes PTL Lv  3 Current           2 Term 01/08/18 [redacted]w[redacted]d 06:36 / 00:55 3725 g M Vag-Spont EPI N LIV  1 Term 11/26/11 [redacted]w[redacted]d  2410 g M Vag-Spont EPI N LIV    Past Surgical History: Past Surgical History:  Procedure Laterality Date   WISDOM TOOTH EXTRACTION      Family History: Family History  Problem Relation Age of Onset   Diabetes Maternal Aunt    Breast cancer  Maternal Aunt    Breast cancer Paternal Aunt    Lung cancer Paternal Aunt    Other Maternal Grandmother        malformed kidney    Social History: Social History   Tobacco Use   Smoking status: Former    Packs/day: 0.50    Types: Cigarettes   Smokeless tobacco: Never   Tobacco comments:    quit April 2019  Vaping Use   Vaping Use: Never used  Substance Use Topics   Alcohol use: Not Currently    Comment: once in a blue moon   Drug use: No    Allergies: No Known Allergies  Meds:  Medications Prior to Admission  Medication Sig Dispense Refill Last Dose   Doxylamine-Pyridoxine ER (BONJESTA) 20-20 MG TBCR Take by mouth.   03/17/2021 at 0100   nitrofurantoin, macrocrystal-monohydrate, (MACROBID) 100 MG capsule Take 100 mg by mouth daily.   03/16/2021   Prenatal Vit-Fe Fumarate-FA (PRENATAL VITAMIN PO) Take by mouth.   03/16/2021   promethazine (PHENERGAN) 25 MG tablet Take 1 tablet (25 mg total) by mouth every 6 (six) hours as needed for nausea or vomiting. 30 tablet 1 03/16/2021   verapamil (CALAN-SR) 120 MG CR tablet Take 1 tablet (120 mg total) by mouth at bedtime. 30 tablet 5 03/16/2021   albuterol (VENTOLIN HFA) 108 (90 Base) MCG/ACT inhaler Inhale 1-2  puffs into the lungs every 6 (six) hours as needed for wheezing or shortness of breath. (Patient not taking: Reported on 02/23/2021) 1 each 0    Doxylamine-Pyridoxine (DICLEGIS) 10-10 MG TBEC 2 tabs q hs, if sx persist add 1 tab q am on day 3, if sx persist add 1 tab q afternoon on day 4 100 tablet 6     I have reviewed patient's Past Medical Hx, Surgical Hx, Family Hx, Social Hx, medications and allergies.   ROS:  Review of Systems  Constitutional:  Negative for chills and fever.  Gastrointestinal:  Positive for abdominal pain (crampy), diarrhea and vomiting.  Neurological:  Positive for headaches.  Other systems negative  Physical Exam  Patient Vitals for the past 24 hrs:  BP Temp Pulse Resp SpO2 Height Weight  03/17/21 0423  117/71 99.5 F (37.5 C) (!) 108 17 99 % 5\' 2"  (1.575 m) 78.9 kg   Constitutional: Well-developed, well-nourished female in no acute distress.  Cardiovascular: normal rate and rhythm Respiratory: normal effort, clear to auscultation bilaterally GI: Abd soft, non-tender, gravid appropriate for gestational age.   No rebound or guarding. MS: Extremities nontender, no edema, normal ROM Neurologic: Alert and oriented x 4.  GU: Neg CVAT.  FHT:  166   Labs: Results for orders placed or performed during the hospital encounter of 03/17/21 (from the past 24 hour(s))  Urinalysis, Routine w reflex microscopic Urine, Clean Catch     Status: Abnormal   Collection Time: 03/17/21  4:38 AM  Result Value Ref Range   Color, Urine YELLOW YELLOW   APPearance CLEAR CLEAR   Specific Gravity, Urine 1.030 1.005 - 1.030   pH 5.0 5.0 - 8.0   Glucose, UA NEGATIVE NEGATIVE mg/dL   Hgb urine dipstick NEGATIVE NEGATIVE   Bilirubin Urine NEGATIVE NEGATIVE   Ketones, ur 80 (A) NEGATIVE mg/dL   Protein, ur 30 (A) NEGATIVE mg/dL   Nitrite NEGATIVE NEGATIVE   Leukocytes,Ua NEGATIVE NEGATIVE   RBC / HPF 0-5 0 - 5 RBC/hpf   WBC, UA 0-5 0 - 5 WBC/hpf   Bacteria, UA NONE SEEN NONE SEEN   Squamous Epithelial / LPF 0-5 0 - 5   Mucus PRESENT   Basic metabolic panel     Status: Abnormal   Collection Time: 03/17/21  5:44 AM  Result Value Ref Range   Sodium 134 (L) 135 - 145 mmol/L   Potassium 3.8 3.5 - 5.1 mmol/L   Chloride 104 98 - 111 mmol/L   CO2 19 (L) 22 - 32 mmol/L   Glucose, Bld 95 70 - 99 mg/dL   BUN 14 6 - 20 mg/dL   Creatinine, Ser 05/15/21 0.44 - 1.00 mg/dL   Calcium 9.1 8.9 - 0.53 mg/dL   GFR, Estimated 97.6 >73 mL/min   Anion gap 11 5 - 15  CBC     Status: Abnormal   Collection Time: 03/17/21  5:44 AM  Result Value Ref Range   WBC 9.4 4.0 - 10.5 K/uL   RBC 3.74 (L) 3.87 - 5.11 MIL/uL   Hemoglobin 12.0 12.0 - 15.0 g/dL   HCT 05/15/21 (L) 93.7 - 90.2 %   MCV 93.6 80.0 - 100.0 fL   MCH 32.1 26.0 - 34.0 pg    MCHC 34.3 30.0 - 36.0 g/dL   RDW 40.9 73.5 - 32.9 %   Platelets 239 150 - 400 K/uL   nRBC 0.0 0.0 - 0.2 %    A/Positive/-- (01/19 1345)  Imaging:  MAU Course/MDM: I have ordered labs and reviewed results. These are WNL IV and meds ordered:  Zofran, B6, Fentanyl  This helped nausea but low back still hurts.  Thinks she pulled something with vomiting  Treatments in MAU included IVF, zofran, B6, Fentanyl and Flexeril.   Back pain resolved with Flexeril  Assessment: S[place] IUP at.[redacted]w[redacted]d Nausea, vomiting and diarrhea Low back spasm  Plan: Discharge home Rx Zofran for nausea Advance diet as tolerated Follow up in Office for prenatal visits Encouraged to return if she develops worsening of symptoms, increase in pain, fever, or other concerning symptoms.  Pt stable at time of discharge.  Wynelle Bourgeois CNM, MSN Certified Nurse-Midwife 03/17/2021 5:12 AM

## 2021-03-21 ENCOUNTER — Encounter: Payer: Self-pay | Admitting: Women's Health

## 2021-03-23 ENCOUNTER — Other Ambulatory Visit: Payer: Self-pay

## 2021-03-23 ENCOUNTER — Encounter: Payer: Self-pay | Admitting: Women's Health

## 2021-03-23 ENCOUNTER — Ambulatory Visit (INDEPENDENT_AMBULATORY_CARE_PROVIDER_SITE_OTHER): Payer: 59 | Admitting: Women's Health

## 2021-03-23 VITALS — BP 117/79 | HR 90 | Wt 178.0 lb

## 2021-03-23 DIAGNOSIS — Z3A16 16 weeks gestation of pregnancy: Secondary | ICD-10-CM

## 2021-03-23 DIAGNOSIS — Z3480 Encounter for supervision of other normal pregnancy, unspecified trimester: Secondary | ICD-10-CM | POA: Diagnosis not present

## 2021-03-23 DIAGNOSIS — Z363 Encounter for antenatal screening for malformations: Secondary | ICD-10-CM

## 2021-03-23 DIAGNOSIS — Z3482 Encounter for supervision of other normal pregnancy, second trimester: Secondary | ICD-10-CM

## 2021-03-23 NOTE — Patient Instructions (Signed)
Nicole Freeman, thank you for choosing our office today! We appreciate the opportunity to meet your healthcare needs. You may receive a short survey by mail, e-mail, or through MyChart. If you are happy with your care we would appreciate if you could take just a few minutes to complete the survey questions. We read all of your comments and take your feedback very seriously. Thank you again for choosing our office.  [facility] Team at [facility] ([address] [place], [place] [address]) Entrance C, located off of [address] Free 24/7 valet parking  Go to [email or web address] to register for FREE online childbirth classes  Call the office ([phone]) or go to [facility] if: You begin to severe cramping Your water breaks.  Sometimes it is a big gush of fluid, sometimes it is just a trickle that keeps getting your panties wet or running down your legs You have vaginal bleeding.  It is normal to have a small amount of spotting if your cervix was checked.   [place] Pediatricians/Family Doctors [facility] ([facility]): [address], [phone]           [facility]: [address], [phone]                [facility] ([facility]): [address], [phone] (call to ask if accepting patients) [facility]: [address], [place], [phone]    [place] Pediatricians/Family Doctors [facility] ([facility]): [address], [phone] [facility]: [address], [phone] [facility] [place]: [address], [phone]  [place] [facility]  [facility] ([facility]): [phone] [facility]: [address], [phone]   [place] Family Doctors [facility]: [address], [phone]  [place] [facility]  [facility]: [address], [phone]  Home Blood Pressure Monitoring for Patients   Your provider has recommended that you check your blood pressure (BP) at least once a week at home. If you do not have a blood pressure cuff at home, one will be provided for you. Contact your provider if you have not received your monitor within 1 week.   Helpful Tips for Accurate Home Blood Pressure Checks  Don't smoke, exercise, or drink caffeine 30 minutes before checking your BP Use the restroom before checking your BP (a full bladder can raise your pressure) Relax in a comfortable upright chair Feet on the ground Left arm resting comfortably on a flat surface at the level of your heart Legs uncrossed Back supported Sit quietly and don't talk Place the cuff on your bare arm Adjust snuggly, so that only two fingertips can fit between your skin and the top of the cuff Check 2 readings separated by at least one minute Keep a log of your BP readings For a visual, please reference this diagram: http://ccnc.care/bpdiagram  Provider Name: [facility]     Phone: [phone]  Zone 1: ALL CLEAR  Continue to monitor your symptoms:  BP reading is less than 140 (top number) or less than 90 (bottom number)  No right upper stomach pain No headaches or seeing spots No feeling nauseated or throwing up No swelling in face and hands  Zone 2: CAUTION Call your doctor's office for any of the following:  BP reading is greater than 140 (top number) or greater than   90 (bottom number)  Stomach pain under your ribs in the middle or right side Headaches or seeing spots Feeling nauseated or throwing up Swelling in face and hands  Zone 3: EMERGENCY  Seek immediate medical care if you have any of the following:  BP reading is greater than160 (top number) or greater than 110 (bottom number) Severe headaches not improving with Tylenol Serious difficulty catching your breath Any worsening symptoms from  Zone 2     Second Trimester of Pregnancy The second trimester is from week 14 through week 27 (months 4 through 6). The second trimester is often a time when you feel your best. Your body has adjusted to being pregnant, and you begin to feel better physically. Usually, morning sickness has lessened or quit completely, you may have more energy, and you may have an increase in appetite. The second trimester is also a time when the fetus is growing rapidly. At the end of the sixth month, the fetus is about 9 inches long and weighs about 1 pounds. You will likely begin to feel the baby move (quickening) between 16 and 20 weeks of pregnancy. Body changes during your second trimester Your body continues to go through many changes during your second trimester. The changes vary from woman to woman. Your weight will continue to increase. You will notice your lower abdomen bulging out. You may begin to get stretch marks on your hips, abdomen, and breasts. You may develop headaches that can be relieved by medicines. The medicines should be approved by your health care provider. You may urinate more often because the fetus is pressing on your bladder. You may develop or continue to have heartburn as a result of your pregnancy. You may develop constipation because certain hormones are causing the muscles that push waste through your intestines to slow down. You may develop hemorrhoids or swollen, bulging veins (varicose veins). You may have back pain. This is caused by: Weight gain. Pregnancy hormones that are relaxing the joints in your pelvis. A shift in weight and the muscles that support your balance. Your breasts will continue to grow and they will continue to become tender. Your gums may bleed and may be sensitive to brushing and flossing. Dark spots or blotches (chloasma, mask of pregnancy) may develop on your face. This will likely fade after the baby is born. A dark line from your belly button to  the pubic area (linea nigra) may appear. This will likely fade after the baby is born. You may have changes in your hair. These can include thickening of your hair, rapid growth, and changes in texture. Some women also have hair loss during or after pregnancy, or hair that feels dry or thin. Your hair will most likely return to normal after your baby is born.  What to expect at prenatal visits During a routine prenatal visit: You will be weighed to make sure you and the fetus are growing normally. Your blood pressure will be taken. Your abdomen will be measured to track your baby's growth. The fetal heartbeat will be listened to. Any test results from the previous visit will be discussed.  Your health care provider may ask you: How you are feeling. If you are feeling the baby move. If you have had any abnormal symptoms, such as leaking fluid, bleeding, severe headaches, or abdominal cramping. If you are using any tobacco products, including cigarettes, chewing tobacco, and electronic cigarettes. If you have any questions.  Other tests that may be performed during   your second trimester include: Blood tests that check for: Low iron levels (anemia). High blood sugar that affects pregnant women (gestational diabetes) between 24 and 28 weeks. Rh antibodies. This is to check for a protein on red blood cells (Rh factor). Urine tests to check for infections, diabetes, or protein in the urine. An ultrasound to confirm the proper growth and development of the baby. An amniocentesis to check for possible genetic problems. Fetal screens for spina bifida and Down syndrome. HIV (human immunodeficiency virus) testing. Routine prenatal testing includes screening for HIV, unless you choose not to have this test.  Follow these instructions at home: Medicines Follow your health care provider's instructions regarding medicine use. Specific medicines may be either safe or unsafe to take during  pregnancy. Take a prenatal vitamin that contains at least 600 micrograms (mcg) of folic acid. If you develop constipation, try taking a stool softener if your health care provider approves. Eating and drinking Eat a balanced diet that includes fresh fruits and vegetables, whole grains, good sources of protein such as meat, eggs, or tofu, and low-fat dairy. Your health care provider will help you determine the amount of weight gain that is right for you. Avoid raw meat and uncooked cheese. These carry germs that can cause birth defects in the baby. If you have low calcium intake from food, talk to your health care provider about whether you should take a daily calcium supplement. Limit foods that are high in fat and processed sugars, such as fried and sweet foods. To prevent constipation: Drink enough fluid to keep your urine clear or pale yellow. Eat foods that are high in fiber, such as fresh fruits and vegetables, whole grains, and beans. Activity Exercise only as directed by your health care provider. Most women can continue their usual exercise routine during pregnancy. Try to exercise for 30 minutes at least 5 days a week. Stop exercising if you experience uterine contractions. Avoid heavy lifting, wear low heel shoes, and practice good posture. A sexual relationship may be continued unless your health care provider directs you otherwise. Relieving pain and discomfort Wear a good support bra to prevent discomfort from breast tenderness. Take warm sitz baths to soothe any pain or discomfort caused by hemorrhoids. Use hemorrhoid cream if your health care provider approves. Rest with your legs elevated if you have leg cramps or low back pain. If you develop varicose veins, wear support hose. Elevate your feet for 15 minutes, 3-4 times a day. Limit salt in your diet. Prenatal Care Write down your questions. Take them to your prenatal visits. Keep all your prenatal visits as told by your health  care provider. This is important. Safety Wear your seat belt at all times when driving. Make a list of emergency phone numbers, including numbers for family, friends, the hospital, and police and fire departments. General instructions Ask your health care provider for a referral to a local prenatal education class. Begin classes no later than the beginning of month 6 of your pregnancy. Ask for help if you have counseling or nutritional needs during pregnancy. Your health care provider can offer advice or refer you to specialists for help with various needs. Do not use hot tubs, steam rooms, or saunas. Do not douche or use tampons or scented sanitary pads. Do not cross your legs for long periods of time. Avoid cat litter boxes and soil used by cats. These carry germs that can cause birth defects in the baby and possibly loss of the   fetus by miscarriage or stillbirth. Avoid all smoking, herbs, alcohol, and unprescribed drugs. Chemicals in these products can affect the formation and growth of the baby. Do not use any products that contain nicotine or tobacco, such as cigarettes and e-cigarettes. If you need help quitting, ask your health care provider. Visit your dentist if you have not gone yet during your pregnancy. Use a soft toothbrush to brush your teeth and be gentle when you floss. Contact a health care provider if: You have dizziness. You have mild pelvic cramps, pelvic pressure, or nagging pain in the abdominal area. You have persistent nausea, vomiting, or diarrhea. You have a bad smelling vaginal discharge. You have pain when you urinate. Get help right away if: You have a fever. You are leaking fluid from your vagina. You have spotting or bleeding from your vagina. You have severe abdominal cramping or pain. You have rapid weight gain or weight loss. You have shortness of breath with chest pain. You notice sudden or extreme swelling of your face, hands, ankles, feet, or legs. You  have not felt your baby move in over an hour. You have severe headaches that do not go away when you take medicine. You have vision changes. Summary The second trimester is from week 14 through week 27 (months 4 through 6). It is also a time when the fetus is growing rapidly. Your body goes through many changes during pregnancy. The changes vary from woman to woman. Avoid all smoking, herbs, alcohol, and unprescribed drugs. These chemicals affect the formation and growth your baby. Do not use any tobacco products, such as cigarettes, chewing tobacco, and e-cigarettes. If you need help quitting, ask your health care provider. Contact your health care provider if you have any questions. Keep all prenatal visits as told by your health care provider. This is important. This information is not intended to replace advice given to you by your health care provider. Make sure you discuss any questions you have with your health care provider. Document Released: 01/16/2001 Document Revised: 06/30/2015 Document Reviewed: 03/25/2012 Elsevier Interactive Patient Education  2017 [employer]vier Inc.  

## 2021-03-23 NOTE — Progress Notes (Signed)
LOW-RISK PREGNANCY VISIT Patient name: Nicole Freeman MRN [id number]  Date of birth: 1995-06-06 Chief Complaint:   Routine Prenatal Visit  History of Present Illness:   Nicole Freeman is a 25 y.o. G56P2002 female at [redacted]w[redacted]d with an Estimated Date of Delivery: 09/04/21 being seen today for ongoing management of a low-risk pregnancy.   Today she reports no complaints. Contractions: Not present. Vag. Bleeding: None.  Movement: Present. denies leaking of fluid.  Depression screen [facility] 2/9 02/23/2021 07/21/2020 05/12/2018  Decreased Interest 0 0 0  Down, Depressed, Hopeless 0 0 0  PHQ - 2 Score 0 0 0  Altered sleeping 0 - 0  Tired, decreased energy 0 - 0  Change in appetite 0 - 0  Feeling bad or failure about yourself  0 - 0  Trouble concentrating 0 - 0  Moving slowly or fidgety/restless 0 - 0  Suicidal thoughts 0 - 0  PHQ-9 Score 0 - 0     GAD 7 : Generalized Anxiety Score 02/23/2021  Nervous, Anxious, on Edge 0  Control/stop worrying 0  Worry too much - different things 0  Trouble relaxing 0  Restless 0  Easily annoyed or irritable 1  Afraid - awful might happen 0  Total GAD 7 Score 1      Review of Systems:   Pertinent items are noted in HPI Denies abnormal vaginal discharge w/ itching/odor/irritation, headaches, visual changes, shortness of breath, chest pain, abdominal pain, severe nausea/vomiting, or problems with urination or bowel movements unless otherwise stated above. Pertinent History Reviewed:  Reviewed past medical,surgical, social, obstetrical and family history.  Reviewed problem list, medications and allergies. Physical Assessment:   Vitals:   03/23/21 0842  BP: 117/79  Pulse: 90  Weight: 178 lb (80.7 kg)  Body mass index is 32.56 kg/m.        Physical Examination:   General appearance: Well appearing, and in no distress  Mental status: Alert, oriented to person, place, and time  Skin: Warm & dry  Cardiovascular: Normal heart  rate noted  Respiratory: Normal respiratory effort, no distress  Abdomen: Soft, gravid, nontender  Pelvic: Cervical exam deferred         Extremities: Edema: None  Fetal Status: Fetal Heart Rate (bpm): 142   Movement: Present    Chaperone: N/A   No results found for this or any previous visit (from the past 24 hour(s)).  Assessment & Plan:  1) Low-risk pregnancy G3P2002 at [redacted]w[redacted]d with an Estimated Date of Delivery: 09/04/21   2) Von Willebrands, Factor 8 activity at 12wks wnl @ 122   Meds: No orders of the defined types were placed in this encounter.  Labs/procedures today: 2nd IT  Plan:  Continue routine obstetrical care  Next visit: prefers will be in person for u/s     Reviewed: Preterm labor symptoms and general obstetric precautions including but not limited to vaginal bleeding, contractions, leaking of fluid and fetal movement were reviewed in detail with the patient.  All questions were answered.   Follow-up: Return in about 3 weeks (around 04/13/2021) for LROB, [id number], CNM, in person.  Future Appointments  Date Time Provider Department Center  04/06/2021 10:30 AM [facility] [place] [facility] None  04/06/2021 11:50 AM Cheral Marker, CNM [facility]  05/01/2021 10:50 AM Drema Dallas, DO [facility] None  06/14/2021  9:20 AM Marguerita Beards, MD [facility]    Orders Placed This Encounter  Procedures   US OB Comp +  14 Wk   INTEGRATED 2   Cheral Marker CNM, [facility] 03/23/2021 9:07 AM

## 2021-03-24 ENCOUNTER — Other Ambulatory Visit: Payer: Self-pay | Admitting: Women's Health

## 2021-03-24 MED ORDER — PANTOPRAZOLE SODIUM 20 MG PO TBEC: 20.0000 mg | DELAYED_RELEASE_TABLET | Freq: Every day | ORAL | 6 refills | Status: DC

## 2021-03-25 LAB — INTEGRATED 2
AFP MoM: 1.27
Alpha-Fetoprotein: 42 ng/mL
Crown Rump Length: 69.9 mm
DIA MoM: 1.2
DIA Value: 166.9 pg/mL
Estriol, Unconjugated: 0.71 ng/mL
Gest. Age on Collection Date: 13 weeks
Gestational Age: 17 weeks
Maternal Age at EDD: 27.1 yr
Nuchal Translucency (NT): 1.6 mm
Nuchal Translucency MoM: 1.01
Number of Fetuses: 1
PAPP-A MoM: 2.22
PAPP-A Value: 2069 ng/mL
Test Results:: NEGATIVE
Weight: 179 [lb_av]
Weight: 180 [lb_av]
hCG MoM: 1.05
hCG Value: 29.7 IU/mL
uE3 MoM: 0.63

## 2021-04-01 ENCOUNTER — Encounter: Payer: Self-pay | Admitting: Women's Health

## 2021-04-06 ENCOUNTER — Encounter: Payer: Self-pay | Admitting: Women's Health

## 2021-04-06 ENCOUNTER — Other Ambulatory Visit: Payer: Self-pay

## 2021-04-06 ENCOUNTER — Ambulatory Visit (INDEPENDENT_AMBULATORY_CARE_PROVIDER_SITE_OTHER): Payer: 59 | Admitting: Women's Health

## 2021-04-06 ENCOUNTER — Ambulatory Visit (INDEPENDENT_AMBULATORY_CARE_PROVIDER_SITE_OTHER): Payer: 59

## 2021-04-06 VITALS — BP 118/73 | HR 78 | Wt 174.0 lb

## 2021-04-06 DIAGNOSIS — Z3A18 18 weeks gestation of pregnancy: Secondary | ICD-10-CM

## 2021-04-06 DIAGNOSIS — Z3480 Encounter for supervision of other normal pregnancy, unspecified trimester: Secondary | ICD-10-CM

## 2021-04-06 DIAGNOSIS — Z363 Encounter for antenatal screening for malformations: Secondary | ICD-10-CM

## 2021-04-06 DIAGNOSIS — Z3482 Encounter for supervision of other normal pregnancy, second trimester: Secondary | ICD-10-CM

## 2021-04-06 NOTE — Progress Notes (Signed)
[place] 18+3 wks,breech,cx 4.5 cm,posterior placenta gr 0,normal ovaries,SVP of fluid 4.7 cm,FHR 138 bpm,EFW 283 g 88%,anatomy complete,no obvious abnormalities ?

## 2021-04-06 NOTE — Progress Notes (Signed)
? ? ?LOW-RISK PREGNANCY VISIT ?Patient name: CECILY Freeman MRN [id number]  Date of birth: Jan 17, 1996 ?Chief Complaint:   ?Routine Prenatal Visit ([place] today) ? ?History of Present Illness:   ?Nicole Freeman is a 26 y.o. G20P2002 female at [redacted]w[redacted]d with an Estimated Date of Delivery: 09/04/21 being seen today for ongoing management of a low-risk pregnancy.  ? ?Today she reports  pain Rt ear into jaw, has had dental work recently, not sure if that or ear . Contractions: Not present. Vag. Bleeding: None.  Movement: Present. denies leaking of fluid. ? ?Depression screen [facility] 2/9 02/23/2021 07/21/2020 05/12/2018  ?Decreased Interest 0 0 0  ?Down, Depressed, Hopeless 0 0 0  ?PHQ - 2 Score 0 0 0  ?Altered sleeping 0 - 0  ?Tired, decreased energy 0 - 0  ?Change in appetite 0 - 0  ?Feeling bad or failure about yourself  0 - 0  ?Trouble concentrating 0 - 0  ?Moving slowly or fidgety/restless 0 - 0  ?Suicidal thoughts 0 - 0  ?PHQ-9 Score 0 - 0  ? ?  ?GAD 7 : Generalized Anxiety Score 02/23/2021  ?Nervous, Anxious, on Edge 0  ?Control/stop worrying 0  ?Worry too much - different things 0  ?Trouble relaxing 0  ?Restless 0  ?Easily annoyed or irritable 1  ?Afraid - awful might happen 0  ?Total GAD 7 Score 1  ? ? ?  ?Review of Systems:   ?Pertinent items are noted in HPI ?Denies abnormal vaginal discharge w/ itching/odor/irritation, headaches, visual changes, shortness of breath, chest pain, abdominal pain, severe nausea/vomiting, or problems with urination or bowel movements unless otherwise stated above. ?Pertinent History Reviewed:  ?Reviewed past medical,surgical, social, obstetrical and family history.  ?Reviewed problem list, medications and allergies. ?Physical Assessment:  ? ?Vitals:  ? 04/06/21 1135  ?BP: 118/73  ?Pulse: 78  ?Weight: 174 lb (78.9 kg)  ?Body mass index is 31.83 kg/m?. ?  ?     Physical Examination:  ? General appearance: Well appearing, and in no distress ? Mental status: Alert, oriented to  person, place, and time ? Skin: Warm & dry ? Ears: normal bilaterally ? Cardiovascular: Normal heart rate noted ? Respiratory: Normal respiratory effort, no distress ? Abdomen: Soft, gravid, nontender ? Pelvic: Cervical exam deferred        ? Extremities: Edema: None ? ?Fetal Status:     Movement: Present  S 18+3 wks,breech,cx 4.5 cm,posterior placenta gr 0,normal ovaries,SVP of fluid 4.7 cm,FHR 138 bpm,EFW 283 g 88%,anatomy complete,no obvious abnormalities ?  ? ?Chaperone: N/A   ?No results found for this or any previous visit (from the past 24 hour(s)).  ?Assessment & Plan:  ?1) Low-risk pregnancy G3P2002 at [redacted]w[redacted]d with an Estimated Date of Delivery: 09/04/21  ? ?2) Von Willebrands, Factor 8 activity @ 12wks wnl, rpt @ 36wk per [facility] ?  ?Meds: No orders of the defined types were placed in this encounter. ? ?Labs/procedures today: U/S ? ?Plan:  Continue routine obstetrical care  ?Next visit: prefers in person   ? ?Reviewed: Preterm labor symptoms and general obstetric precautions including but not limited to vaginal bleeding, contractions, leaking of fluid and fetal movement were reviewed in detail with the patient.  All questions were answered. Does have home bp cuff. Office bp cuff given: not applicable. Check bp weekly, let us know if consistently >140 and/or >90. ? ?Follow-up: Return in about 4 weeks (around 05/04/2021) for [place], [place], in person. ? ?Future Appointments  ?Date Time Provider  Department Center  ?05/01/2021 10:50 AM Pieter Partridge, DO [facility] None  ?06/23/2021  9:20 AM Jaquita Folds, MD [facility]  ? ? ?No orders of the defined types were placed in this encounter. ? ?Roma Schanz CNM, WHNP-BC ?04/06/2021 ?12:04 PM  ?

## 2021-04-06 NOTE — Patient Instructions (Signed)
[place], thank you for choosing our office today! We appreciate the opportunity to meet your healthcare needs. You may receive a short survey by mail, e-mail, or through MyChart. If you are happy with your care we would appreciate if you could take just a few minutes to complete the survey questions. We read all of your comments and take your feedback very seriously. Thank you again for choosing our office.  [facility] Team at [facility] ([address] [place], [place] [address]) Entrance C, located off of [address] Free 24/7 valet parking  Go to [email or web address] to register for FREE online childbirth classes  Call the office ([phone]) or go to [facility] if: You begin to severe cramping Your water breaks.  Sometimes it is a big gush of fluid, sometimes it is just a trickle that keeps getting your panties wet or running down your legs You have vaginal bleeding.  It is normal to have a small amount of spotting if your cervix was checked.   [place] Pediatricians/Family Doctors [facility] ([facility]): [address], [phone]           [facility]: [address], [phone]                [facility] ([facility]): [address], [phone] (call to ask if accepting patients) [facility]: [address], [place], [phone]    [place] Pediatricians/Family Doctors [facility] ([facility]): [address], [phone] [facility]: [address], [phone] [facility]: [address], [phone]  [place] Family Doctors  [facility] ([facility]): [phone] [facility]: [address], [phone]   [place] Family Doctors [facility]: [address], [phone]  [place] [facility]  [facility]: [phone], [phone]  Home Blood Pressure Monitoring for Patients   Your provider has recommended that you check your blood pressure (BP) at least once a week at home. If you do not have a blood pressure cuff at home, one will be provided for you. Contact your provider if you have not received your monitor within 1 week.   Helpful Tips for Accurate Home Blood Pressure Checks  Don't smoke, exercise, or drink caffeine 30 minutes before checking your BP Use the restroom before checking your BP (a full bladder can raise your pressure) Relax in a comfortable upright chair Feet on the ground Left arm resting comfortably on a flat surface at the level of your heart Legs uncrossed Back supported Sit quietly and don't talk Place the cuff on your bare arm Adjust snuggly, so that only two fingertips can fit between your skin and the top of the cuff Check 2 readings separated by at least one minute Keep a log of your BP readings For a visual, please reference this diagram: http://[facility].care/bpdiagram  Provider Name: [facility]     Phone: [phone]  Zone 1: ALL CLEAR  Continue to monitor your symptoms:  BP reading is less than 140 (top number) or less than 90 (bottom number)  No right upper stomach pain No headaches or seeing spots No feeling nauseated or throwing up No swelling in face and hands  Zone 2: CAUTION Call your doctor's office for any of the following:  BP reading is greater than 140 (top number) or greater than   90 (bottom number)  Stomach pain under your ribs in the middle or right side Headaches or seeing spots Feeling nauseated or throwing up Swelling in face and hands  Zone 3: EMERGENCY  Seek immediate medical care if you have any of the following:  BP reading is greater than160 (top number) or greater than 110 (bottom number) Severe headaches not improving with Tylenol Serious difficulty catching your breath Any worsening symptoms from  Zone 2     Second Trimester of Pregnancy The second trimester is from week 14 through week 27 (months 4 through 6). The second trimester is often a time when you feel your best. Your body has adjusted to being pregnant, and you begin to feel better physically. Usually, morning sickness has lessened or quit completely, you may have more energy, and you may have an increase in appetite. The second trimester is also a time when the fetus is growing rapidly. At the end of the sixth month, the fetus is about 9 inches long and weighs about 1 pounds. You will likely begin to feel the baby move (quickening) between 16 and 20 weeks of pregnancy. Body changes during your second trimester Your body continues to go through many changes during your second trimester. The changes vary from woman to woman. Your weight will continue to increase. You will notice your lower abdomen bulging out. You may begin to get stretch marks on your hips, abdomen, and breasts. You may develop headaches that can be relieved by medicines. The medicines should be approved by your health care provider. You may urinate more often because the fetus is pressing on your bladder. You may develop or continue to have heartburn as a result of your pregnancy. You may develop constipation because certain hormones are causing the muscles that push waste through your intestines to slow down. You may develop hemorrhoids or swollen, bulging veins (varicose veins). You may have back pain. This is caused by: Weight gain. Pregnancy hormones that are relaxing the joints in your pelvis. A shift in weight and the muscles that support your balance. Your breasts will continue to grow and they will continue to become tender. Your gums may bleed and may be sensitive to brushing and flossing. Dark spots or blotches (chloasma, mask of pregnancy) may develop on your face. This will likely fade after the baby is born. A dark line from your belly button to  the pubic area (linea nigra) may appear. This will likely fade after the baby is born. You may have changes in your hair. These can include thickening of your hair, rapid growth, and changes in texture. Some women also have hair loss during or after pregnancy, or hair that feels dry or thin. Your hair will most likely return to normal after your baby is born.  What to expect at prenatal visits During a routine prenatal visit: You will be weighed to make sure you and the fetus are growing normally. Your blood pressure will be taken. Your abdomen will be measured to track your baby's growth. The fetal heartbeat will be listened to. Any test results from the previous visit will be discussed.  Your health care provider may ask you: How you are feeling. If you are feeling the baby move. If you have had any abnormal symptoms, such as leaking fluid, bleeding, severe headaches, or abdominal cramping. If you are using any tobacco products, including cigarettes, chewing tobacco, and electronic cigarettes. If you have any questions.  Other tests that may be performed during   your second trimester include: Blood tests that check for: Low iron levels (anemia). High blood sugar that affects pregnant women (gestational diabetes) between 24 and 28 weeks. Rh antibodies. This is to check for a protein on red blood cells (Rh factor). Urine tests to check for infections, diabetes, or protein in the urine. An ultrasound to confirm the proper growth and development of the baby. An amniocentesis to check for possible genetic problems. Fetal screens for spina bifida and Down syndrome. HIV (human immunodeficiency virus) testing. Routine prenatal testing includes screening for HIV, unless you choose not to have this test.  Follow these instructions at home: Medicines Follow your health care provider's instructions regarding medicine use. Specific medicines may be either safe or unsafe to take during  pregnancy. Take a prenatal vitamin that contains at least 600 micrograms (mcg) of folic acid. If you develop constipation, try taking a stool softener if your health care provider approves. Eating and drinking Eat a balanced diet that includes fresh fruits and vegetables, whole grains, good sources of protein such as meat, eggs, or tofu, and low-fat dairy. Your health care provider will help you determine the amount of weight gain that is right for you. Avoid raw meat and uncooked cheese. These carry germs that can cause birth defects in the baby. If you have low calcium intake from food, talk to your health care provider about whether you should take a daily calcium supplement. Limit foods that are high in fat and processed sugars, such as fried and sweet foods. To prevent constipation: Drink enough fluid to keep your urine clear or pale yellow. Eat foods that are high in fiber, such as fresh fruits and vegetables, whole grains, and beans. Activity Exercise only as directed by your health care provider. Most women can continue their usual exercise routine during pregnancy. Try to exercise for 30 minutes at least 5 days a week. Stop exercising if you experience uterine contractions. Avoid heavy lifting, wear low heel shoes, and practice good posture. A sexual relationship may be continued unless your health care provider directs you otherwise. Relieving pain and discomfort Wear a good support bra to prevent discomfort from breast tenderness. Take warm sitz baths to soothe any pain or discomfort caused by hemorrhoids. Use hemorrhoid cream if your health care provider approves. Rest with your legs elevated if you have leg cramps or low back pain. If you develop varicose veins, wear support hose. Elevate your feet for 15 minutes, 3-4 times a day. Limit salt in your diet. Prenatal Care Write down your questions. Take them to your prenatal visits. Keep all your prenatal visits as told by your health  care provider. This is important. Safety Wear your seat belt at all times when driving. Make a list of emergency phone numbers, including numbers for family, friends, the hospital, and police and fire departments. General instructions Ask your health care provider for a referral to a local prenatal education class. Begin classes no later than the beginning of month 6 of your pregnancy. Ask for help if you have counseling or nutritional needs during pregnancy. Your health care provider can offer advice or refer you to specialists for help with various needs. Do not use hot tubs, steam rooms, or saunas. Do not douche or use tampons or scented sanitary pads. Do not cross your legs for long periods of time. Avoid cat litter boxes and soil used by cats. These carry germs that can cause birth defects in the baby and possibly loss of the   fetus by miscarriage or stillbirth. Avoid all smoking, herbs, alcohol, and unprescribed drugs. Chemicals in these products can affect the formation and growth of the baby. Do not use any products that contain nicotine or tobacco, such as cigarettes and e-cigarettes. If you need help quitting, ask your health care provider. Visit your dentist if you have not gone yet during your pregnancy. Use a soft toothbrush to brush your teeth and be gentle when you floss. Contact a health care provider if: You have dizziness. You have mild pelvic cramps, pelvic pressure, or nagging pain in the abdominal area. You have persistent nausea, vomiting, or diarrhea. You have a bad smelling vaginal discharge. You have pain when you urinate. Get help right away if: You have a fever. You are leaking fluid from your vagina. You have spotting or bleeding from your vagina. You have severe abdominal cramping or pain. You have rapid weight gain or weight loss. You have shortness of breath with chest pain. You notice sudden or extreme swelling of your face, hands, ankles, feet, or legs. You  have not felt your baby move in over an hour. You have severe headaches that do not go away when you take medicine. You have vision changes. Summary The second trimester is from week 14 through week 27 (months 4 through 6). It is also a time when the fetus is growing rapidly. Your body goes through many changes during pregnancy. The changes vary from woman to woman. Avoid all smoking, herbs, alcohol, and unprescribed drugs. These chemicals affect the formation and growth your baby. Do not use any tobacco products, such as cigarettes, chewing tobacco, and e-cigarettes. If you need help quitting, ask your health care provider. Contact your health care provider if you have any questions. Keep all prenatal visits as told by your health care provider. This is important. This information is not intended to replace advice given to you by your health care provider. Make sure you discuss any questions you have with your health care provider. Document Released: 01/16/2001 Document Revised: 06/30/2015 Document Reviewed: 03/25/2012 Elsevier Interactive Patient Education  2017 [employer]vier Inc.  

## 2021-04-26 NOTE — Progress Notes (Deleted)
? ?NEUROLOGY FOLLOW UP OFFICE NOTE ? ?Nicole Freeman ?[id number] ? ?Assessment/Plan:  ? ?1.  Migraine without aura, without status migrainosus, not intractable, improved off of birth control.  I believe current headaches represent migraines and not a symptom of idiopathic intracranial hypertension.  Semiology consistent with migraines.  Eye exam has not indicated true papilledema. ?2. Pseudopapilledema  ?3.  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  Her Chiari is borderline.  There is no abnormal morphology of the cerebellum, brainstem and upper cervical spinal cord. ?  ?Migraine prevention:  As she is still trying to get pregnant, will treat migraines with a medication that would be potentially safe in pregnancy.  Verapamil CR 120mg  daily.  Of course, if she should become pregnant, her migraines would more likely improve anyway and she can discontinue it. ?Limit use of pain relievers to no more than 2 days out of week to prevent risk of rebound or medication-overuse headache. ?Keep headache diary ?Follow up 6 months. ?  ?  ?Subjective:  ?Nicole Freeman is a 26 year old right-handed female with von Willebrand disease who follows up for headaches. ?  ?UPDATE: ?Started on verapamil CR 120mg  daily.  *** ?  ?  ?HISTORY:  ?She started having migraines in 2019 when she became pregnant with her second child.  She had gained about 60 lbs.  After her second child, she had an IUD implanted.  They occurred every other week and then gradually increased in frequency.  Around April or May 2021, headaches became daily.  She endorses severe bitemporal/top of head pounding pain associated with nausea, photophobia, phonophobia, double vision/blurred vision.  They would last until she would lay down in a cool dark room.  Se was taking tylenol or naproxen almost daily.  She also endorsed pulsatile tinnitus.  On a couple of occasions, she noted transient darkening of  vision.  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  She was referred to a neurologist in [place].  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  She was started topiramate.  Headaches had almost resolved.  She also lost 10 lbs.  She was referred to ophthalmology, Dr. Marshall Cork this month who diagnosed pseudopapilledema noting elevated RNFL with hyperemic nerves but no disc edema or HVF defects.  She continues to take topiramate 25mg  at bedtime.  Headaches are controlled on it.  However, she plans to start trying for another baby.  She also reports intermittent numbness and tingling in hands and feet for the past couple of years which has since gotten worse over past few months.  Her previous neurologist told her that it was likely secondary to the Chiari.  ? ?Past medications:  topiramate (effective), magnesium oxide 400mg  (made headaches worse), riboflavin 400mg  (made headaches worse) ? ?PAST MEDICAL HISTORY: ?Past Medical History:  ?Diagnosis Date  ? Chiari malformation type I ([place])   ? Headache   ? Von Willebrand disease   ? ? ?MEDICATIONS: ?Current Outpatient Medications on File Prior to Visit  ?Medication Sig Dispense Refill  ? albuterol (VENTOLIN HFA) 108 (90 Base) MCG/ACT inhaler Inhale 1-2 puffs into the lungs every 6 (six) hours as needed for wheezing or shortness of breath. 1 each 0  ? Doxylamine-Pyridoxine ER (BONJESTA) 20-20 MG TBCR Take by mouth.    ?  nitrofurantoin, macrocrystal-monohydrate, (MACROBID) 100 MG capsule Take 100 mg by mouth daily.    ? ondansetron (ZOFRAN-ODT) 4 MG disintegrating tablet Take 1 tablet (4 mg total) by mouth every 6 (six) hours as needed for nausea. 20 tablet 0  ? pantoprazole (PROTONIX) 20 MG tablet Take 1 tablet (20 mg total) by mouth daily. 30 tablet 6  ? Prenatal  Vit-Fe Fumarate-FA (PRENATAL VITAMIN PO) Take by mouth.    ? promethazine (PHENERGAN) 25 MG tablet Take 1 tablet (25 mg total) by mouth every 6 (six) hours as needed for nausea or vomiting. 30 tablet 1  ? verapamil (CALAN-SR) 120 MG CR tablet Take 1 tablet (120 mg total) by mouth at bedtime. 30 tablet 5  ? ?No current facility-administered medications on file prior to visit.  ? ? ?ALLERGIES: ?No Known Allergies ? ?FAMILY HISTORY: ?Family History  ?Problem Relation Age of Onset  ? Diabetes Maternal Aunt   ? Breast cancer Maternal Aunt   ? Breast cancer Paternal Aunt   ? Lung cancer Paternal Aunt   ? Other Maternal Grandmother   ?     malformed kidney  ? ? ?  ?Objective:  ?*** ?General: No acute distress.  Patient appears ***-groomed.   ?Head:  Normocephalic/atraumatic ?Eyes:  Fundi examined but not visualized ?Neck: supple, no paraspinal tenderness, full range of motion ?Heart:  Regular rate and rhythm ?Lungs:  Clear to auscultation bilaterally ?Back: No paraspinal tenderness ?Neurological Exam: alert and oriented to person, place, and time.  Speech fluent and not dysarthric, language intact.  CN II-XII intact. Bulk and tone normal, muscle strength 5/5 throughout.  Sensation to light touch intact.  Deep tendon reflexes 2+ throughout, toes downgoing.  Finger to nose testing intact.  Gait normal, Romberg negative. ? ? ?Metta Clines, DO ? ?CC: *** ? ? ? ? ? ? ?

## 2021-05-01 ENCOUNTER — Ambulatory Visit: Payer: 59 | Admitting: Neurology

## 2021-05-04 ENCOUNTER — Ambulatory Visit (INDEPENDENT_AMBULATORY_CARE_PROVIDER_SITE_OTHER): Payer: 59 | Admitting: Obstetrics & Gynecology

## 2021-05-04 ENCOUNTER — Encounter: Payer: Self-pay | Admitting: Obstetrics & Gynecology

## 2021-05-04 VITALS — BP 106/72 | HR 87 | Wt 175.5 lb

## 2021-05-04 DIAGNOSIS — G935 Compression of brain: Secondary | ICD-10-CM

## 2021-05-04 DIAGNOSIS — D6800 Von Willebrand disease, unspecified: Secondary | ICD-10-CM

## 2021-05-04 DIAGNOSIS — Z3480 Encounter for supervision of other normal pregnancy, unspecified trimester: Secondary | ICD-10-CM

## 2021-05-04 NOTE — Progress Notes (Signed)
? ?LOW-RISK PREGNANCY VISIT ?Patient name: Nicole Freeman MRN [id number]  Date of birth: 1995/03/01 ?Chief Complaint:   ?Routine Prenatal Visit (Having vaginal pain at times) ? ?History of Present Illness:   ?Nicole Freeman is a 26 y.o. G4P2002 female at [redacted]w[redacted]d with an Estimated Date of Delivery: 09/04/21 being seen today for ongoing management of a low-risk pregnancy.  ? ?  02/23/2021  ? 11:26 AM 07/21/2020  ?  1:11 PM 05/12/2018  ?  2:55 PM  ?Depression screen PHQ 2/9  ?Decreased Interest 0 0 0  ?Down, Depressed, Hopeless 0 0 0  ?PHQ - 2 Score 0 0 0  ?Altered sleeping 0  0  ?Tired, decreased energy 0  0  ?Change in appetite 0  0  ?Feeling bad or failure about yourself  0  0  ?Trouble concentrating 0  0  ?Moving slowly or fidgety/restless 0  0  ?Suicidal thoughts 0  0  ?PHQ-9 Score 0  0  ? ? ?Today she reports no complaints. Contractions: Irritability. Vag. Bleeding: None.  Movement: Present. denies leaking of fluid. ?Review of Systems:   ?Pertinent items are noted in HPI ?Denies abnormal vaginal discharge w/ itching/odor/irritation, headaches, visual changes, shortness of breath, chest pain, abdominal pain, severe nausea/vomiting, or problems with urination or bowel movements unless otherwise stated above. ?Pertinent History Reviewed:  ?Reviewed past medical,surgical, social, obstetrical and family history.  ?Reviewed problem list, medications and allergies. ?Physical Assessment:  ? ?Vitals:  ? 05/04/21 0924  ?BP: 106/72  ?Pulse: 87  ?Weight: 175 lb 8 oz (79.6 kg)  ?Body mass index is 32.1 kg/m?. ?  ?     Physical Examination:  ? General appearance: Well appearing, and in no distress ? Mental status: Alert, oriented to person, place, and time ? Skin: Warm & dry ? Cardiovascular: Normal heart rate noted ? Respiratory: Normal respiratory effort, no distress ? Abdomen: Soft, gravid, nontender ? Pelvic: Cervical exam deferred        ? Extremities: Edema: None ? ?Fetal Status: Fetal Heart Rate  (bpm): 160 Fundal Height: 22 cm Movement: Present   ? ?Chaperone: n/a   ? ?No results found for this or any previous visit (from the past 24 hour(s)).  ?Assessment & Plan:  ?1) Low-risk pregnancy G3P2002 at [redacted]w[redacted]d with an Estimated Date of Delivery: 09/04/21  ? ?  ICD-10-CM   ?1. Supervision of other normal pregnancy, antepartum  Z34.80   ?  ?2. 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 has had epidural x 2, should not be an issue, anesthesia consult via MyChart in 3rd trimester  ?  ?3. Von Willebrand disease: never an issue, check factor activity at 36 weeks  D68.00   ? Desmopressin on hand but not prophylacic unless activity low  ?  ? ? ?  ?Meds: No orders of the defined types were placed in this encounter. ? ?Labs/procedures today:  ? ?Plan:  Continue routine obstetrical care  ?Next visit: prefers in person   ? ?Reviewed: Preterm labor symptoms and general obstetric precautions including but not limited to vaginal bleeding, contractions, leaking of fluid and fetal movement were reviewed in detail with the patient.  All questions were answered. Has home bp cuff.  Check bp weekly, let us know if >140/90.  ? ?Follow-up: Return in about 4 weeks (around 06/01/2021) for PN2, LROB. ? ?No orders of the defined types were placed in this encounter. ? ? ?Lazaro Arms, MD ?05/04/2021 ?9:57 AM ? ?

## 2021-05-22 NOTE — Progress Notes (Signed)
? ?NEUROLOGY FOLLOW UP OFFICE NOTE ? ?Nicole Freeman ?[id number] ? ?Assessment/Plan:  ? ?1.  Migraine without aura, without status migrainosus, not intractable ?2. Pseudopapilledema - clinically stable. ?3. Pregnant at 6 months ?4.  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  Her Chiari is borderline.  There is no abnormal morphology of the cerebellum, brainstem and upper cervical spinal cord. ?  ?Migraine prevention:  Verapamil CR 120mg  daily ?Limit use of acetaminophen to no more than 2 days out of week to prevent risk of rebound or medication-overuse headache. ?Keep headache diary ?Follow up 4 months. ?  ?  ?Subjective:  ?Nicole Freeman is a 26 year old right-handed female with von Willebrand disease who follows up for headaches. ?  ?UPDATE: ?Started on verapamil CR 120mg  daily.  She is now currently 6 months pregnant.  Headaches are improved. She has a migraine about once a month.  Takes acetaminophen.  Sometimes if she bends over to grab something or if she gets up too quick, she may see tiny silver and black spots in her vision for 30 seconds.  Saw ophthalmology last month.  Hyperemic nerves (grade 1 edema right, very minimal edema) but no disc edema, VFD.  Stable RNFL.  Denies blurred vision or visual obscurations.  Denies pulsatile tinnitus.  Due date 7/31 but suspects will be early.  Plans to breastfeed.   ?  ?  ?HISTORY:  ?She started having migraines in 2019 when she became pregnant with her second child.  She had gained about 60 lbs.  After her second child, she had an IUD implanted.  They occurred every other week and then gradually increased in frequency.  Around April or May 2021, headaches became daily.  She endorses severe bitemporal/top of head pounding pain associated with nausea, photophobia, phonophobia, double vision/blurred vision.  They would last until she would lay down in a cool dark room.  Se was taking tylenol or  naproxen almost daily.  She also endorsed pulsatile tinnitus.  On a couple of occasions, she noted transient darkening of vision.  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  She was referred to a neurologist in [place].  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  She was started topiramate.  Headaches had almost resolved.  She also lost 10 lbs.  She was referred to ophthalmology, Dr. Garrison this month who diagnosed pseudopapilledema noting elevated RNFL with hyperemic nerves but no disc edema or HVF defects.  She continues to take topiramate 25mg  at bedtime.  Headaches are controlled on it.  However, she plans to start trying for another baby.  She also reports intermittent numbness and tingling in hands and feet for the past couple of years which has since gotten worse over past few months.  Her previous neurologist told her that it was likely secondary to the Chiari.  ? ?Past medications:  topiramate (effective), magnesium oxide 400mg  (made headaches worse), riboflavin 400mg  (made headaches worse) ? ?PAST MEDICAL HISTORY: ?Past Medical History:  ?Diagnosis Date  ? Chiari malformation type I (HCC)   ? Headache   ? Von Willebrand disease   ? ? ?MEDICATIONS: ?Current Outpatient Medications on File Prior to Visit  ?Medication Sig Dispense Refill  ? albuterol (VENTOLIN HFA) 108 (90 Base) MCG/ACT inhaler Inhale  1-2 puffs into the lungs every 6 (six) hours as needed for wheezing or shortness of breath. 1 each 0  ? Doxylamine-Pyridoxine ER (BONJESTA) 20-20 MG TBCR Take by mouth.    ? pantoprazole (PROTONIX) 20 MG tablet Take 1 tablet (20 mg total) by mouth daily. 30 tablet 6  ? Prenatal Vit-Fe Fumarate-FA (PRENATAL VITAMIN PO) Take by mouth.    ? verapamil (CALAN-SR) 120 MG CR tablet Take 1 tablet (120 mg total)  by mouth at bedtime. 30 tablet 5  ? ?No current facility-administered medications on file prior to visit.  ? ? ?ALLERGIES: ?No Known Allergies ? ?FAMILY HISTORY: ?Family History  ?Problem Relation Age of Onset  ? Diabetes Maternal Aunt   ? Breast cancer Maternal Aunt   ? Breast cancer Paternal Aunt   ? Lung cancer Paternal Aunt   ? Other Maternal Grandmother   ?     malformed kidney  ? ? ?  ?Objective:  ?Blood pressure (!) 94/53, pulse 68, height 5\' 2"  (1.575 m), weight 176 lb (79.8 kg), last menstrual period 11/28/2020, SpO2 98 %. ?General: No acute distress.  Patient appears well-groomed.   ?Head:  Normocephalic/atraumatic ?Eyes:  Fundi examined but not visualized ?Neck: supple, no paraspinal tenderness, full range of motion ?Heart:  Regular rate and rhythm ?Lungs:  Clear to auscultation bilaterally ?Back: No paraspinal tenderness ?Neurological Exam: alert and oriented to person, place, and time.  Speech fluent and not dysarthric, language intact.  CN II-XII intact. Bulk and tone normal, muscle strength 5/5 throughout.  Sensation to light touch intact.  Deep tendon reflexes 2+ throughout.  Finger to nose testing intact.  Gait normal, Romberg negative. ? ? ?11/30/2020, DO ? ? ? ? ? ? ? ?

## 2021-05-23 ENCOUNTER — Encounter: Payer: Self-pay | Admitting: Neurology

## 2021-05-23 ENCOUNTER — Telehealth: Payer: Self-pay | Admitting: *Deleted

## 2021-05-23 ENCOUNTER — Ambulatory Visit (INDEPENDENT_AMBULATORY_CARE_PROVIDER_SITE_OTHER): Payer: 59 | Admitting: Advanced Practice Midwife

## 2021-05-23 ENCOUNTER — Encounter: Payer: Self-pay | Admitting: *Deleted

## 2021-05-23 ENCOUNTER — Ambulatory Visit (INDEPENDENT_AMBULATORY_CARE_PROVIDER_SITE_OTHER): Payer: 59 | Admitting: Neurology

## 2021-05-23 VITALS — BP 94/53 | HR 68 | Ht 62.0 in | Wt 176.0 lb

## 2021-05-23 VITALS — BP 103/70 | HR 78 | Wt 177.0 lb

## 2021-05-23 DIAGNOSIS — G43009 Migraine without aura, not intractable, without status migrainosus: Secondary | ICD-10-CM

## 2021-05-23 DIAGNOSIS — Z3490 Encounter for supervision of normal pregnancy, unspecified, unspecified trimester: Secondary | ICD-10-CM

## 2021-05-23 DIAGNOSIS — O26899 Other specified pregnancy related conditions, unspecified trimester: Secondary | ICD-10-CM

## 2021-05-23 DIAGNOSIS — R102 Pelvic and perineal pain: Secondary | ICD-10-CM

## 2021-05-23 DIAGNOSIS — H47333 Pseudopapilledema of optic disc, bilateral: Secondary | ICD-10-CM | POA: Diagnosis not present

## 2021-05-23 DIAGNOSIS — G935 Compression of brain: Secondary | ICD-10-CM

## 2021-05-23 DIAGNOSIS — Z3A25 25 weeks gestation of pregnancy: Secondary | ICD-10-CM

## 2021-05-23 LAB — POCT URINALYSIS DIPSTICK OB
Blood, UA: NEGATIVE
Glucose, UA: NEGATIVE
Ketones, UA: NEGATIVE
Leukocytes, UA: NEGATIVE
Nitrite, UA: NEGATIVE
POC,PROTEIN,UA: NEGATIVE

## 2021-05-23 MED ORDER — VERAPAMIL HCL ER 120 MG PO TBCR: 120.0000 mg | EXTENDED_RELEASE_TABLET | Freq: Every day | ORAL | 5 refills | Status: DC

## 2021-05-23 NOTE — Patient Instructions (Signed)
Continue verapamil ?If you should experience worsening vision or increased headaches, let me know.  May need to get a repeat eye exam. ?Follow up 4 months. ?

## 2021-05-23 NOTE — Progress Notes (Signed)
? ?  LOW-RISK PREGNANCY VISIT- work in for cramping/back pain ?Patient name: Nicole Freeman MRN [id number]  Date of birth: 1995-04-27 ?Chief Complaint:   ?Abdominal Cramping and Pelvic Pain ? ?History of Present Illness:   ?Nicole Freeman is a 26 y.o. G35P2002 female at [redacted]w[redacted]d with an Estimated Date of Delivery: 09/04/21 being seen today for ongoing management of a low-risk pregnancy.  ?Today she reports  began with pelvic 'soreness' approx 4d ago, then had some back cramping which continues today; no dysuria or bleeding . Contractions: Irritability. Vag. Bleeding: None.  Movement: Present. denies leaking of fluid. ?Review of Systems:   ?Pertinent items are noted in HPI ?Denies abnormal vaginal discharge w/ itching/odor/irritation, headaches, visual changes, shortness of breath, chest pain, abdominal pain, severe nausea/vomiting, or problems with urination or bowel movements unless otherwise stated above. ?Pertinent History Reviewed:  ?Reviewed past medical,surgical, social, obstetrical and family history.  ?Reviewed problem list, medications and allergies. ?Physical Assessment:  ? ?Vitals:  ? 05/23/21 1543  ?BP: 103/70  ?Pulse: 78  ?Weight: 177 lb (80.3 kg)  ?Body mass index is 32.37 kg/m?. ?  ?     Physical Examination:  ? General appearance: Well appearing, and in no distress ? Mental status: Alert, oriented to person, place, and time ? Skin: Warm & dry ? Cardiovascular: Normal heart rate noted ? Respiratory: Normal respiratory effort, no distress ? Abdomen: Soft, gravid, nontender ? Pelvic: Cervical exam performed  Dilation: Closed Effacement (%): Thick Station: -3 ? Extremities: Edema: None ? ?Fetal Status: Fetal Heart Rate (bpm): 133   Movement: Present   ? ?Results for orders placed or performed in visit on 05/23/21 (from the past 24 hour(s))  ?POC Urinalysis Dipstick OB  ? Collection Time: 05/23/21  4:17 PM  ?Result Value Ref Range  ? Color, UA    ? Clarity, UA    ? Glucose, UA Negative  Negative  ? Bilirubin, UA    ? Ketones, UA neg   ? Spec Grav, UA    ? Blood, UA neg   ? pH, UA    ? POC,PROTEIN,UA Negative Negative, Trace, Small (1+), Moderate (2+), Large (3+), 4+  ? Urobilinogen, UA    ? Nitrite, UA neg   ? Leukocytes, UA Negative Negative  ? Appearance    ? Odor    ?  ?Assessment & Plan:  ?1) Low-risk pregnancy G3P2002 at [redacted]w[redacted]d with an Estimated Date of Delivery: 09/04/21  ? ?2) Cramping/back pain, discomforts of preg; rev'd when to call/when to be seen ?  ?Meds: No orders of the defined types were placed in this encounter. ? ?Labs/procedures today: SVE; UA (negative) ? ?Plan:  Continue routine obstetrical care  ? ?Reviewed: Preterm labor symptoms and general obstetric precautions including but not limited to vaginal bleeding, contractions, leaking of fluid and fetal movement were reviewed in detail with the patient.  All questions were answered. Has home bp cuff. Check bp weekly, let us know if >140/90.  ? ?Follow-up: Return for As scheduled. ? ?Orders Placed This Encounter  ?Procedures  ? POC Urinalysis Dipstick OB  ? ?Myrtis Ser CNM ?05/23/2021 ?4:18 PM  ?

## 2021-05-23 NOTE — Telephone Encounter (Signed)
Louie Bun, CNM reviewed notes. Pt to come in for eval. Call transferred to [facility] for appt. JSY ?

## 2021-05-26 ENCOUNTER — Encounter: Payer: Self-pay | Admitting: Women's Health

## 2021-06-01 ENCOUNTER — Encounter: Payer: Self-pay | Admitting: Women's Health

## 2021-06-01 ENCOUNTER — Other Ambulatory Visit: Payer: 59

## 2021-06-01 ENCOUNTER — Ambulatory Visit (INDEPENDENT_AMBULATORY_CARE_PROVIDER_SITE_OTHER): Payer: 59 | Admitting: Women's Health

## 2021-06-01 VITALS — BP 122/71 | HR 84 | Wt 177.2 lb

## 2021-06-01 DIAGNOSIS — Z3A26 26 weeks gestation of pregnancy: Secondary | ICD-10-CM

## 2021-06-01 DIAGNOSIS — Z3482 Encounter for supervision of other normal pregnancy, second trimester: Secondary | ICD-10-CM

## 2021-06-01 DIAGNOSIS — Z3480 Encounter for supervision of other normal pregnancy, unspecified trimester: Secondary | ICD-10-CM

## 2021-06-01 DIAGNOSIS — Z23 Encounter for immunization: Secondary | ICD-10-CM

## 2021-06-01 DIAGNOSIS — Z131 Encounter for screening for diabetes mellitus: Secondary | ICD-10-CM

## 2021-06-01 NOTE — Patient Instructions (Signed)
Nicole Freeman, thank you for choosing our office today! We appreciate the opportunity to meet your healthcare needs. You may receive a short survey by mail, e-mail, or through [employer]. If you are happy with your care we would appreciate if you could take just a few minutes to complete the survey questions. We read all of your comments and take your feedback very seriously. Thank you again for choosing our office.  ?Center for [employer] Team at [facility] ? ?[facility] [place] at [facility] ?([address] [place], [place] [address]) ?Entrance C, located off of [address] ?Free 24/7 valet parking  ? ?CLASSES: Go to [email or web address] to register for classes (childbirth, breastfeeding, waterbirth, infant CPR, daddy bootcamp, etc.) ? ?Call the office [phone]) or go to [facility] if: ?You begin to have strong, frequent contractions ?Your water breaks.  Sometimes it is a big gush of fluid, sometimes it is just a trickle that keeps getting your panties wet or running down your legs ?You have vaginal bleeding.  It is normal to have a small amount of spotting if your cervix was checked.  ?You don't feel your baby moving like normal.  If you don't, get you something to eat and drink and lay down and focus on feeling your baby move.   If your baby is still not moving like normal, you should call the office or go to [facility]. ? ?Call the office [phone]) or go to [facility] hospital for these signs of pre-eclampsia: ?Severe headache that does not go away with Tylenol ?Visual changes- seeing spots, double, blurred vision ?Pain under your right breast or upper abdomen that does not go away with Tums or heartburn medicine ?Nausea and/or vomiting ?Severe swelling in your hands, feet, and face  ? ?Tdap Vaccine ?It is recommended that you get the Tdap vaccine during the third trimester of EACH pregnancy to help protect your baby from getting pertussis (whooping cough) ?27-36 weeks is the BEST time to do  this so that you can pass the protection on to your baby. During pregnancy is better than after pregnancy, but if you are unable to get it during pregnancy it will be offered at the hospital.  ?You can get this vaccine with [place], at the health department, your family doctor, or some local pharmacies ?Everyone who will be around your baby should also be up-to-date on their vaccines before the baby comes. Adults (who are not pregnant) only need 1 dose of Tdap during adulthood.  ? ?[place] Pediatricians/Family Doctors ?[facility]): [address], [phone]           ?[facility]: [address], [phone]                ?[facility]): [place], [phone] (call to ask if accepting patients) ?[facility] Department: [address], [place], [place]   ? ?[place] Pediatricians/Family Doctors ?[facility]): [address] [place], [address], [phone] ?[place]: [address] [place], [phone] ?[facility] [place]: [place], [phone] ? ?[place]  ?[facility]): [phone] ?[facility]: [place], [phone]  ? ?[place] ?[place]: [place] [address], [phone] ? ?[place]  ?[facility]: [phone], [phone] ? ?Home Blood Pressure Monitoring for Patients  ? ?Your provider has recommended that you check your  blood pressure (BP) at least once a week at home. If you do not have a blood pressure cuff at home, one will be provided for you. Contact your provider if you have not received your monitor within 1 week.  ? ?Helpful Tips for Accurate Home Blood Pressure Checks  ?Don't smoke, exercise, or drink caffeine 30 minutes before checking your BP ?Use the restroom before checking your BP (a full bladder can raise your  pressure) ?Relax in a comfortable upright chair ?Feet on the ground ?Left arm resting comfortably on a flat surface at the level of your heart ?Legs uncrossed ?Back supported ?Sit quietly and don't talk ?Place the cuff on your bare arm ?Adjust snuggly, so that only two fingertips can fit between your skin and the top of the cuff ?Check 2 readings separated by at least one minute ?Keep a log of your BP readings ?For a visual, please reference this diagram: http://ccnc.care/bpdiagram ? ?Provider Name: [facility]     Phone: [phone] ? ?Zone 1: ALL CLEAR  ?Continue to monitor your symptoms:  ?BP reading is less than 140 (top number) or less than 90 (bottom number)  ?No right upper stomach pain ?No headaches or seeing spots ?No feeling nauseated or throwing up ?No swelling in face and hands ? ?Zone 2: CAUTION ?Call your doctor's office for any of the following:  ?BP reading is greater than 140 (top number) or greater than 90 (bottom number)  ?Stomach pain under your ribs in the middle or right side ?Headaches or seeing spots ?Feeling nauseated or throwing up ?Swelling in face and hands ? ?Zone 3: EMERGENCY  ?Seek immediate medical care if you have any of the following:  ?BP reading is greater than160 (top number) or greater than 110 (bottom number) ?Severe headaches not improving with Tylenol ?Serious difficulty catching your breath ?Any worsening symptoms from Zone 2  ? ?Third Trimester of Pregnancy ?The third trimester is from week 29 through week 42, months 7 through 9. The third trimester is a time when the fetus is growing rapidly. At the end of the ninth month, the fetus is about 20 inches in length and weighs 6-10 pounds.  ?BODY CHANGES ?Your body goes through many changes during pregnancy. The changes vary from woman to woman.  ?Your weight will continue to increase. You can expect to gain 25-35 pounds (11-16 kg) by the end of the pregnancy. ?You may begin to get stretch marks on your hips, abdomen,  and breasts. ?You may urinate more often because the fetus is moving lower into your pelvis and pressing on your bladder. ?You may develop or continue to have heartburn as a result of your pregnancy. ?You may develop constipation because certain hormones are causing the muscles that push waste through your intestines to slow down. ?You may develop hemorrhoids or swollen, bulging veins (varicose veins). ?You may have pelvic pain because of the weight gain and pregnancy hormones relaxing your joints between the bones in your pelvis. Backaches may result from overexertion of the muscles supporting your posture. ?You may have changes in your hair. These can include thickening of your hair, rapid growth, and changes in texture. Some women also have hair loss during or after pregnancy, or hair that feels dry or thin. Your hair will most likely return to normal after your baby is born. ?Your breasts will continue to grow and be tender. A yellow discharge may leak from your breasts called colostrum. ?Your belly button may stick out. ?You may  feel short of breath because of your expanding uterus. ?You may notice the fetus "dropping," or moving lower in your abdomen. ?You may have a bloody mucus discharge. This usually occurs a few days to a week before labor begins. ?Your cervix becomes thin and soft (effaced) near your due date. ?WHAT TO EXPECT AT [place]  ?You will have prenatal exams every 2 weeks until week 36. Then, you will have weekly prenatal exams. During a routine prenatal visit: ?You will be weighed to make sure you and the fetus are growing normally. ?Your blood pressure is taken. ?Your abdomen will be measured to track your baby's growth. ?The fetal heartbeat will be listened to. ?Any test results from the previous visit will be discussed. ?You may have a cervical check near your due date to see if you have effaced. ?At around 36 weeks, your caregiver will check your cervix. At the same time, your  caregiver will also perform a test on the secretions of the vaginal tissue. This test is to determine if a type of bacteria, Group B streptococcus, is present. Your caregiver will explain this further. ?Your

## 2021-06-01 NOTE — Progress Notes (Signed)
? ? ?LOW-RISK PREGNANCY VISIT ?Patient name: Nicole Freeman MRN [id number]  Date of birth: 1995-07-14 ?Chief Complaint:   ?Routine Prenatal Visit and PN2 ? ?History of Present Illness:   ?Nicole Freeman is a 26 y.o. G64P2002 female at [redacted]w[redacted]d with an Estimated Date of Delivery: 09/04/21 being seen today for ongoing management of a low-risk pregnancy.  ? ?Today she reports no complaints. Contractions: Irritability. Vag. Bleeding: None.  Movement: Present. denies leaking of fluid. ? ? ?  06/01/2021  ?  9:41 AM 02/23/2021  ? 11:26 AM 07/21/2020  ?  1:11 PM 05/12/2018  ?  2:55 PM  ?Depression screen PHQ 2/9  ?Decreased Interest 0 0 0 0  ?Down, Depressed, Hopeless 0 0 0 0  ?PHQ - 2 Score 0 0 0 0  ?Altered sleeping 0 0  0  ?Tired, decreased energy 0 0  0  ?Change in appetite 0 0  0  ?Feeling bad or failure about yourself  0 0  0  ?Trouble concentrating 0 0  0  ?Moving slowly or fidgety/restless 0 0  0  ?Suicidal thoughts 0 0  0  ?PHQ-9 Score 0 0  0  ? ?  ? ?  06/01/2021  ?  9:41 AM 02/23/2021  ? 11:26 AM  ?GAD 7 : Generalized Anxiety Score  ?Nervous, Anxious, on Edge 0 0  ?Control/stop worrying 0 0  ?Worry too much - different things 0 0  ?Trouble relaxing 0 0  ?Restless 0 0  ?Easily annoyed or irritable 0 1  ?Afraid - awful might happen 0 0  ?Total GAD 7 Score 0 1  ? ? ?  ?Review of Systems:   ?Pertinent items are noted in HPI ?Denies abnormal vaginal discharge w/ itching/odor/irritation, headaches, visual changes, shortness of breath, chest pain, abdominal pain, severe nausea/vomiting, or problems with urination or bowel movements unless otherwise stated above. ?Pertinent History Reviewed:  ?Reviewed past medical,surgical, social, obstetrical and family history.  ?Reviewed problem list, medications and allergies. ?Physical Assessment:  ? ?Vitals:  ? 06/01/21 0919  ?BP: 122/71  ?Pulse: 84  ?Weight: 177 lb 3.2 oz (80.4 kg)  ?Body mass index is 32.41 kg/m?. ?  ?     Physical Examination:  ? General  appearance: Well appearing, and in no distress ? Mental status: Alert, oriented to person, place, and time ? Skin: Warm & dry ? Cardiovascular: Normal heart rate noted ? Respiratory: Normal respiratory effort, no distress ? Abdomen: Soft, gravid, nontender ? Pelvic: Cervical exam deferred        ? Extremities: Edema: None ? ?Fetal Status: Fetal Heart Rate (bpm): 133 Fundal Height: 26 cm Movement: Present   ? ?Chaperone: N/A   ?No results found for this or any previous visit (from the past 24 hour(s)).  ?Assessment & Plan:  ?1) Low-risk pregnancy G3P2002 at [redacted]w[redacted]d with an Estimated Date of Delivery: 09/04/21  ? ?2) Chiari malformation ? ?3) Von Willebrands> DVAPT available at [place] if needed at delivery, Factor 8 activity @ 36wks ?  ?Meds: No orders of the defined types were placed in this encounter. ? ?Labs/procedures today: tdap and PN2 ? ?Plan:  Continue routine obstetrical care  ?Next visit: prefers in person   ? ?Reviewed: Preterm labor symptoms and general obstetric precautions including but not limited to vaginal bleeding, contractions, leaking of fluid and fetal movement were reviewed in detail with the patient.  All questions were answered.  ? ?Follow-up: Return in about 4 weeks (around 06/29/2021) for [place], [place],  in person. ? ?Future Appointments  ?Date Time Provider [place]  ?06/23/2021  9:20 AM Jaquita Folds, MD [facility]  ?06/29/2021  9:30 AM Janyth Pupa, DO [facility]  ?10/17/2021  2:10 PM Pieter Partridge, DO [facility] None  ? ? ?Orders Placed This Encounter  ?Procedures  ? Tdap vaccine greater than or equal to 7yo IM  ? ?[place], WHNP-BC ?06/01/2021 ?9:49 AM  ?

## 2021-06-02 LAB — CBC
Hematocrit: 32.1 % — ABNORMAL LOW (ref 34.0–46.6)
Hemoglobin: 10.7 g/dL — ABNORMAL LOW (ref 11.1–15.9)
MCH: 31.3 pg (ref 26.6–33.0)
MCHC: 33.3 g/dL (ref 31.5–35.7)
MCV: 94 fL (ref 79–97)
Platelets: 263 10*3/uL (ref 150–450)
RBC: 3.42 x10E6/uL — ABNORMAL LOW (ref 3.77–5.28)
RDW: 12.1 % (ref 11.7–15.4)
WBC: 9.4 10*3/uL (ref 3.4–10.8)

## 2021-06-02 LAB — GLUCOSE TOLERANCE, 2 HOURS W/ 1HR
Glucose, 1 hour: 96 mg/dL (ref 70–179)
Glucose, 2 hour: 91 mg/dL (ref 70–152)
Glucose, Fasting: 73 mg/dL (ref 70–91)

## 2021-06-02 LAB — ANTIBODY SCREEN: Antibody Screen: NEGATIVE

## 2021-06-02 LAB — HIV ANTIBODY (ROUTINE TESTING W REFLEX): HIV Screen 4th Generation wRfx: NONREACTIVE

## 2021-06-02 LAB — RPR: RPR Ser Ql: NONREACTIVE

## 2021-06-14 ENCOUNTER — Ambulatory Visit: Payer: 59 | Admitting: Obstetrics and Gynecology

## 2021-06-23 ENCOUNTER — Ambulatory Visit (INDEPENDENT_AMBULATORY_CARE_PROVIDER_SITE_OTHER): Payer: 59 | Admitting: Obstetrics and Gynecology

## 2021-06-23 ENCOUNTER — Encounter: Payer: Self-pay | Admitting: Obstetrics and Gynecology

## 2021-06-23 VITALS — BP 115/69 | HR 84

## 2021-06-23 DIAGNOSIS — N390 Urinary tract infection, site not specified: Secondary | ICD-10-CM

## 2021-06-23 NOTE — Progress Notes (Signed)
[facility] Return Visit  SUBJECTIVE  History of Present Illness: JASILYN HOLDERMAN is a 26 y.o. female seen in follow-up for recurrent UTI and has been on macrobid prophylaxis (100mg  daily). She is currently pregnant- 29w 4d.   She has completed the prophylaxis a few weeks ago and has not had any issues with UTI symptoms.   Past Medical History: Patient  has a past medical history of Chiari malformation type I (HCC), Headache, and Von Willebrand disease (HCC).   Past Surgical History: She  has a past surgical history that includes Wisdom tooth extraction.   Medications: She has a current medication list which includes the following prescription(s): albuterol, bonjesta, pantoprazole, prenatal vit-fe fumarate-fa, promethazine, and verapamil.   Allergies: Patient has No Known Allergies.   Social History: Patient  reports that she has quit smoking. Her smoking use included cigarettes. She smoked an average of .5 packs per day. She has never used smokeless tobacco. She reports that she does not currently use alcohol. She reports that she does not use drugs.      OBJECTIVE     Physical Exam: Vitals:   06/23/21 0948  BP: 115/69  Pulse: 84   Gen: No apparent distress, A&O x 3.  Detailed Urogynecologic Evaluation:  Deferred.    ASSESSMENT AND PLAN    Ms. Linzy is a 26 y.o. with:  1. Recurrent UTI    - Infections have resolved. Discussed that if she develops symptoms of UTI then needs to obtain a culture to track bacteria.  - If she develops another UTI during pregnancy, then should restart the macrobid prophylaxis after.   Follow up as needed  30, MD  Time spent: I spent 15 minutes dedicated to the care of this patient on the date of this encounter to include pre-visit review of records, face-to-face time with the patient and post visit documentation.

## 2021-06-29 ENCOUNTER — Ambulatory Visit (INDEPENDENT_AMBULATORY_CARE_PROVIDER_SITE_OTHER): Payer: 59 | Admitting: Obstetrics & Gynecology

## 2021-06-29 ENCOUNTER — Encounter: Payer: Self-pay | Admitting: Obstetrics & Gynecology

## 2021-06-29 VITALS — BP 109/68 | HR 94 | Wt 176.2 lb

## 2021-06-29 DIAGNOSIS — R399 Unspecified symptoms and signs involving the genitourinary system: Secondary | ICD-10-CM

## 2021-06-29 DIAGNOSIS — Z3480 Encounter for supervision of other normal pregnancy, unspecified trimester: Secondary | ICD-10-CM

## 2021-06-29 DIAGNOSIS — Z3A30 30 weeks gestation of pregnancy: Secondary | ICD-10-CM

## 2021-06-29 LAB — POCT URINALYSIS DIPSTICK OB
Blood, UA: NEGATIVE
Glucose, UA: NEGATIVE
Ketones, UA: NEGATIVE
Leukocytes, UA: NEGATIVE
Nitrite, UA: NEGATIVE
POC,PROTEIN,UA: NEGATIVE

## 2021-06-29 NOTE — Progress Notes (Signed)
LOW-RISK PREGNANCY VISIT Patient name: Nicole Freeman MRN [id number]  Date of birth: [birth year] Chief Complaint:   Routine Prenatal Visit  History of Present Illness:   Nicole Freeman is a 26 y.o. G31P2002 female at [redacted]w[redacted]d with an Estimated Date of Delivery: 09/04/21 being seen today for ongoing management of a low-risk pregnancy.   -Migraines prophylaxis- Verapamil daily -vWB     06/01/2021    9:41 AM 02/23/2021   11:26 AM 07/21/2020    1:11 PM 05/12/2018    2:55 PM  Depression screen PHQ 2/9  Decreased Interest 0 0 0 0  Down, Depressed, Hopeless 0 0 0 0  PHQ - 2 Score 0 0 0 0  Altered sleeping 0 0  0  Tired, decreased energy 0 0  0  Change in appetite 0 0  0  Feeling bad or failure about yourself  0 0  0  Trouble concentrating 0 0  0  Moving slowly or fidgety/restless 0 0  0  Suicidal thoughts 0 0  0  PHQ-9 Score 0 0  0    Today she reports no complaints. Contractions: Irritability. Vag. Bleeding: None.  Movement: Present. denies leaking of fluid. Review of Systems:   Pertinent items are noted in HPI Denies abnormal vaginal discharge w/ itching/odor/irritation, headaches, visual changes, shortness of breath, chest pain, abdominal pain, severe nausea/vomiting, or problems with urination or bowel movements unless otherwise stated above. Pertinent History Reviewed:  Reviewed past medical,surgical, social, obstetrical and family history.  Reviewed problem list, medications and allergies.  Physical Assessment:   Vitals:   06/29/21 0943  BP: 109/68  Pulse: 94  Weight: 176 lb 3.2 oz (79.9 kg)  Body mass index is 32.23 kg/m.        Physical Examination:   General appearance: Well appearing, and in no distress  Mental status: Alert, oriented to person, place, and time  Skin: Warm & dry  Respiratory: Normal respiratory effort, no distress  Abdomen: Soft, gravid, nontender  Pelvic: Cervical exam deferred         Extremities: Edema: None  Psych:   mood and affect appropriate  Fetal Status: Fetal Heart Rate (bpm): 140 Fundal Height: 30 cm Movement: Present    Chaperone: n/a    Results for orders placed or performed in visit on 06/29/21 (from the past 24 hour(s))  POC Urinalysis Dipstick OB   Collection Time: 06/29/21  9:46 AM  Result Value Ref Range   Color, UA     Clarity, UA     Glucose, UA Negative Negative   Bilirubin, UA     Ketones, UA neg    Spec Grav, UA     Blood, UA neg    pH, UA     POC,PROTEIN,UA Negative Negative, Trace, Small (1+), Moderate (2+), Large (3+), 4+   Urobilinogen, UA     Nitrite, UA neg    Leukocytes, UA Negative Negative   Appearance     Odor       Assessment & Plan:  1) Low-risk pregnancy G3P2002 at [redacted]w[redacted]d with an Estimated Date of Delivery: 09/04/21   2) vWB   Meds: No orders of the defined types were placed in this encounter.  Labs/procedures today: none  Plan:  Continue routine obstetrical care  Next visit: prefers in person    Reviewed: Preterm labor symptoms and general obstetric precautions including but not limited to vaginal bleeding, contractions, leaking of fluid and fetal movement were reviewed in detail with the patient.  All questions were answered. Given BP cuff from office today. Check bp weekly, let us know if >140/90.   Follow-up: Return in about 2 weeks (around 07/13/2021) for [place] visit.  Orders Placed This Encounter  Procedures   POC Urinalysis Dipstick OB    Janyth Pupa, DO Attending [place], [facility] for [employer], [place]

## 2021-07-04 ENCOUNTER — Encounter: Payer: Self-pay | Admitting: Women's Health

## 2021-07-04 ENCOUNTER — Encounter: Payer: Self-pay | Admitting: Obstetrics & Gynecology

## 2021-07-04 ENCOUNTER — Ambulatory Visit (INDEPENDENT_AMBULATORY_CARE_PROVIDER_SITE_OTHER): Payer: 59 | Admitting: Obstetrics & Gynecology

## 2021-07-04 VITALS — BP 112/73 | HR 96 | Wt 176.0 lb

## 2021-07-04 DIAGNOSIS — Z3A31 31 weeks gestation of pregnancy: Secondary | ICD-10-CM | POA: Diagnosis not present

## 2021-07-04 DIAGNOSIS — Z3480 Encounter for supervision of other normal pregnancy, unspecified trimester: Secondary | ICD-10-CM

## 2021-07-04 DIAGNOSIS — O0993 Supervision of high risk pregnancy, unspecified, third trimester: Secondary | ICD-10-CM

## 2021-07-04 DIAGNOSIS — D6800 Von Willebrand disease, unspecified: Secondary | ICD-10-CM

## 2021-07-04 DIAGNOSIS — O368130 Decreased fetal movements, third trimester, not applicable or unspecified: Secondary | ICD-10-CM | POA: Diagnosis not present

## 2021-07-04 MED ORDER — CYCLOBENZAPRINE HCL 10 MG PO TABS: 10.0000 mg | ORAL_TABLET | Freq: Three times a day (TID) | ORAL | 1 refills | Status: DC | PRN

## 2021-07-04 NOTE — Progress Notes (Signed)
HIGH-RISK PREGNANCY VISIT Patient name: Nicole Freeman MRN [id number]  Date of birth: 12/12/1995 Chief Complaint:   Routine Prenatal Visit (Cramping/? contractions)  History of Present Illness:   Nicole Freeman is a 26 y.o. 693P2002 female at 6640w1d with an Estimated Date of Delivery: 09/04/21 being seen today for ongoing management of a high-risk pregnancy complicated by VWD type 1.    Today she reports left aided back pain. Contractions: Irregular. Vag. Bleeding: None.  Movement: (!) Decreased. denies leaking of fluid.      06/01/2021    9:41 AM 02/23/2021   11:26 AM 07/21/2020    1:11 PM 05/12/2018    2:55 PM  Depression screen PHQ 2/9  Decreased Interest 0 0 0 0  Down, Depressed, Hopeless 0 0 0 0  PHQ - 2 Score 0 0 0 0  Altered sleeping 0 0  0  Tired, decreased energy 0 0  0  Change in appetite 0 0  0  Feeling bad or failure about yourself  0 0  0  Trouble concentrating 0 0  0  Moving slowly or fidgety/restless 0 0  0  Suicidal thoughts 0 0  0  PHQ-9 Score 0 0  0        06/01/2021    9:41 AM 02/23/2021   11:26 AM  GAD 7 : Generalized Anxiety Score  Nervous, Anxious, on Edge 0 0  Control/stop worrying 0 0  Worry too much - different things 0 0  Trouble relaxing 0 0  Restless 0 0  Easily annoyed or irritable 0 1  Afraid - awful might happen 0 0  Total GAD 7 Score 0 1     Review of Systems:   Pertinent items are noted in HPI Denies abnormal vaginal discharge w/ itching/odor/irritation, headaches, visual changes, shortness of breath, chest pain, abdominal pain, severe nausea/vomiting, or problems with urination or bowel movements unless otherwise stated above. Pertinent History Reviewed:  Reviewed past medical,surgical, social, obstetrical and family history.  Reviewed problem list, medications and allergies. Physical Assessment:   Vitals:   07/04/21 1112  BP: 112/73  Pulse: 96  Weight: 176 lb (79.8 kg)  Body mass index is 32.19 kg/m.            Physical Examination:   General appearance: alert, well appearing, and in no distress  Mental status: alert, oriented to person, place, and time  Skin: warm & dry   Extremities: Edema: None    Cardiovascular: normal heart rate noted  Respiratory: normal respiratory effort, no distress  Abdomen: gravid, soft, non-tender  Pelvic: Cervical exam deferred        Left PSIS pain on palpation Fetal Status:     Movement: (!) Decreased    Fetal Surveillance Testing today: Nicole Freeman is at 4640w1d Estimated Date of Delivery: 09/04/21  NST being performed due to decreased fetal movement  Today the NST is Reactive  Fetal Monitoring:  Baseline: 140s bpm, Variability: Good {> 6 bpm), Accelerations: Reactive, and Decelerations: Absent   reactive  The accelerations are >15 bpm and more than 2 in 20 minutes  Final diagnosis:  Reactive NST  Lazaro ArmsLuther H Latisia Hilaire, MD     Chaperone: Malachy MoodJanet Young    No results found for this or any previous visit (from the past 24 hour(s)).  Assessment & Plan:  High-risk pregnancy: G3P2002 at 6240w1d with an Estimated Date of Delivery: 09/04/21      ICD-10-CM   1. Supervision of other normal  pregnancy, antepartum  Z34.80     2. Von Willebrand disease, Type 1: never an issue, check factor activity at 36 weeks  D68.00     3. Decreased fetal movements in third trimester, single or unspecified fetus  O36.8130        1) VWD type 1, check factor 8 level 36 weeks,   2) reactive NST,   Meds:  Meds ordered this encounter  Medications   cyclobenzaprine (FLEXERIL) 10 MG tablet    Sig: Take 1 tablet (10 mg total) by mouth every 8 (eight) hours as needed for muscle spasms.    Dispense:  20 tablet    Refill:  1    Orders: No orders of the defined types were placed in this encounter.    Labs/procedures today: NST  Treatment Plan:  routine care   Follow-up: Return in about 2 weeks (around 07/18/2021) for [place].   Future Appointments  Date Time  Provider [place]  07/13/2021 10:10 AM Christin Fudge, CNM [facility]  10/17/2021  2:10 PM Pieter Partridge, DO [facility] None    No orders of the defined types were placed in this encounter.  Florian Buff  Attending Physician for the [facility] 07/04/2021 11:57 AM

## 2021-07-13 ENCOUNTER — Ambulatory Visit (INDEPENDENT_AMBULATORY_CARE_PROVIDER_SITE_OTHER): Payer: 59 | Admitting: Advanced Practice Midwife

## 2021-07-13 VITALS — BP 122/66 | HR 92 | Wt 177.0 lb

## 2021-07-13 DIAGNOSIS — Z3A32 32 weeks gestation of pregnancy: Secondary | ICD-10-CM

## 2021-07-13 DIAGNOSIS — Z3483 Encounter for supervision of other normal pregnancy, third trimester: Secondary | ICD-10-CM

## 2021-07-13 NOTE — Progress Notes (Signed)
   HIGH-RISK PREGNANCY VISIT Patient name: Nicole Freeman MRN [id number]  Date of birth: 01/15/1996 Chief Complaint:   Routine Prenatal Visit  History of Present Illness:   Nicole Freeman is a 26 y.o. G44P2002 female at [redacted]w[redacted]d with an Estimated Date of Delivery: 09/04/21 being seen today for ongoing management of a high-risk pregnancy d/t VWB type 1 Today she reports no complaints. Contractions: Irregular. Vag. Bleeding: None.  Movement: Present. denies leaking of fluid. Review of Systems:   Pertinent items are noted in HPI Denies abnormal vaginal discharge w/ itching/odor/irritation, headaches, visual changes, shortness of breath, chest pain, abdominal pain, severe nausea/vomiting, or problems with urination or bowel movements unless otherwise stated above. Pertinent History Reviewed:  Reviewed past medical,surgical, social, obstetrical and family history.  Reviewed problem list, medications and allergies. Physical Assessment:   Vitals:   07/13/21 1020  BP: 122/66  Pulse: 92  Weight: 177 lb (80.3 kg)  Body mass index is 32.37 kg/m.        Physical Examination:   General appearance: Well appearing, and in no distress  Mental status: Alert, oriented to person, place, and time  Skin: Warm & dry  Cardiovascular: Normal heart rate noted  Respiratory: Normal respiratory effort, no distress  Abdomen: Soft, gravid, nontender  Pelvic: Cervical exam deferred         Extremities: Edema: None  Fetal Status: Fetal Heart Rate (bpm): 146 Fundal Height: 34 cm Movement: Present    Chaperone: n/a    No results found for this or any previous visit (from the past 24 hour(s)).  Assessment & Plan:  1) Low-risk pregnancy G3P2002 at [redacted]w[redacted]d with an Estimated Date of Delivery: 09/04/21   2) VWD type 1, check factor 8 activity at 36 weeks   Meds: No orders of the defined types were placed in this encounter.  Labs/procedures today: none  Plan:  Continue routine obstetrical  care  Next visit: prefers in person    Reviewed: Preterm labor symptoms and general obstetric precautions including but not limited to vaginal bleeding, contractions, leaking of fluid and fetal movement were reviewed in detail with the patient.  All questions were answered. has home bp cuff. . Check bp weekly, let us know if >140/90.   Follow-up: No follow-ups on file.  No orders of the defined types were placed in this encounter.  Jacklyn Shell DNP, CNM 07/13/2021 10:42 AM

## 2021-07-28 ENCOUNTER — Ambulatory Visit (INDEPENDENT_AMBULATORY_CARE_PROVIDER_SITE_OTHER): Payer: 59 | Admitting: Obstetrics & Gynecology

## 2021-07-28 ENCOUNTER — Encounter: Payer: Self-pay | Admitting: Obstetrics & Gynecology

## 2021-07-28 VITALS — BP 108/67 | HR 84 | Wt 175.0 lb

## 2021-07-28 DIAGNOSIS — Z3483 Encounter for supervision of other normal pregnancy, third trimester: Secondary | ICD-10-CM

## 2021-07-28 DIAGNOSIS — D6800 Von Willebrand disease, unspecified: Secondary | ICD-10-CM

## 2021-07-28 NOTE — Progress Notes (Signed)
   LOW-RISK PREGNANCY VISIT Patient name: Nicole Freeman MRN [id number]  Date of birth: 1995-03-19 Chief Complaint:   Routine Prenatal Visit  History of Present Illness:   Nicole Freeman is a 26 y.o. G32P2002 female at [redacted]w[redacted]d with an Estimated Date of Delivery: 09/04/21 being seen today for ongoing management of a low-risk pregnancy.     06/01/2021    9:41 AM 02/23/2021   11:26 AM 07/21/2020    1:11 PM 05/12/2018    2:55 PM  Depression screen PHQ 2/9  Decreased Interest 0 0 0 0  Down, Depressed, Hopeless 0 0 0 0  PHQ - 2 Score 0 0 0 0  Altered sleeping 0 0  0  Tired, decreased energy 0 0  0  Change in appetite 0 0  0  Feeling bad or failure about yourself  0 0  0  Trouble concentrating 0 0  0  Moving slowly or fidgety/restless 0 0  0  Suicidal thoughts 0 0  0  PHQ-9 Score 0 0  0    Today she reports no complaints. Contractions: Irritability. Vag. Bleeding: None.  Movement: Present. denies leaking of fluid. Review of Systems:   Pertinent items are noted in HPI Denies abnormal vaginal discharge w/ itching/odor/irritation, headaches, visual changes, shortness of breath, chest pain, abdominal pain, severe nausea/vomiting, or problems with urination or bowel movements unless otherwise stated above. Pertinent History Reviewed:  Reviewed past medical,surgical, social, obstetrical and family history.  Reviewed problem list, medications and allergies. Physical Assessment:   Vitals:   07/28/21 0838  BP: 108/67  Pulse: 84  Weight: 175 lb (79.4 kg)  Body mass index is 32.01 kg/m.        Physical Examination:   General appearance: Well appearing, and in no distress  Mental status: Alert, oriented to person, place, and time  Skin: Warm & dry  Cardiovascular: Normal heart rate noted  Respiratory: Normal respiratory effort, no distress  Abdomen: Soft, gravid, nontender  Pelvic: Cervical exam deferred         Extremities: Edema: None  Fetal Status: Fetal Heart  Rate (bpm): 130 Fundal Height: 34 cm Movement: Present    Chaperone: n/a    No results found for this or any previous visit (from the past 24 hour(s)).  Assessment & Plan:  1) Low-risk pregnancy G3P2002 at [redacted]w[redacted]d with an Estimated Date of Delivery: 09/04/21   2) VWD Type 1: Factor 8 activity level at next visit,    Meds: No orders of the defined types were placed in this encounter.  Labs/procedures today: none VWD factor next visit  Plan:  Continue routine obstetrical care  Next visit: prefers in person    Reviewed: Preterm labor symptoms and general obstetric precautions including but not limited to vaginal bleeding, contractions, leaking of fluid and fetal movement were reviewed in detail with the patient.  All questions were answered. Has home bp cuff. Rx faxed to [facility]. Check bp weekly, let us know if >140/90.   Follow-up: Return in about 13 days (around 08/10/2021) for LROB + Factor 8 activity.  Orders Placed This Encounter  Procedures   Factor 8 ristocetin cofactor    Lazaro Arms, MD 07/28/2021 9:05 AM

## 2021-08-10 ENCOUNTER — Other Ambulatory Visit (HOSPITAL_COMMUNITY)
Admission: RE | Admit: 2021-08-10 | Discharge: 2021-08-10 | Disposition: A | Discharge status: Home / Self Care | Payer: 59 | Source: Ambulatory Visit | Attending: Advanced Practice Midwife | Admitting: Advanced Practice Midwife

## 2021-08-10 ENCOUNTER — Ambulatory Visit (INDEPENDENT_AMBULATORY_CARE_PROVIDER_SITE_OTHER): Payer: 59 | Admitting: Advanced Practice Midwife

## 2021-08-10 VITALS — BP 121/67 | HR 94 | Wt 175.0 lb

## 2021-08-10 DIAGNOSIS — Z3480 Encounter for supervision of other normal pregnancy, unspecified trimester: Secondary | ICD-10-CM

## 2021-08-10 DIAGNOSIS — Z3A36 36 weeks gestation of pregnancy: Secondary | ICD-10-CM

## 2021-08-10 DIAGNOSIS — Z3483 Encounter for supervision of other normal pregnancy, third trimester: Secondary | ICD-10-CM | POA: Diagnosis present

## 2021-08-10 DIAGNOSIS — D6800 Von Willebrand disease, unspecified: Secondary | ICD-10-CM | POA: Diagnosis not present

## 2021-08-10 MED ORDER — ONDANSETRON 4 MG PO TBDP: 4.0000 mg | ORAL_TABLET | Freq: Four times a day (QID) | ORAL | 2 refills | Status: DC | PRN

## 2021-08-10 NOTE — Progress Notes (Addendum)
   LOW-RISK PREGNANCY VISIT Patient name: Nicole Freeman MRN [id number]  Date of birth: 1995-05-27 Chief Complaint:   No chief complaint on file.  History of Present Illness:   Nicole Freeman is a 26 y.o. G96P2002 female at [redacted]w[redacted]d with an Estimated Date of Delivery: 09/04/21 being seen today for ongoing management of a low-risk pregnancy.  Today she reports no complaints. Contractions: Irritability. Vag. Bleeding: None.  Movement: Present. denies leaking of fluid. Review of Systems:   Pertinent items are noted in HPI Denies abnormal vaginal discharge w/ itching/odor/irritation, headaches, visual changes, shortness of breath, chest pain, abdominal pain, severe nausea/vomiting, or problems with urination or bowel movements unless otherwise stated above. Pertinent History Reviewed:  Reviewed past medical,surgical, social, obstetrical and family history.  Reviewed problem list, medications and allergies. Physical Assessment:   Vitals:   08/10/21 0911  BP: 121/67  Pulse: 94  Weight: 175 lb (79.4 kg)  Body mass index is 32.01 kg/m.        Physical Examination:   General appearance: Well appearing, and in no distress  Mental status: Alert, oriented to person, place, and time  Skin: Warm & dry  Cardiovascular: Normal heart rate noted  Respiratory: Normal respiratory effort, no distress  Abdomen: Soft, gravid, nontender  Pelvic: Cervical exam performed  Dilation: 2 Effacement (%): Thick Station: -2  Extremities: Edema: None  Fetal Status: Fetal Heart Rate (bpm): 132 Fundal Height: 35 cm Movement: Present Presentation: Vertex  Chaperone: Amanda Rash    No results found for this or any previous visit (from the past 24 hour(s)).  Assessment & Plan:  1) Low-risk pregnancy G3P2002 at [redacted]w[redacted]d with an Estimated Date of Delivery: 09/04/21   2) VWD, check factor I activity today   Meds:  Meds ordered this encounter  Medications   ondansetron (ZOFRAN-ODT) 4 MG  disintegrating tablet    Sig: Take 1 tablet (4 mg total) by mouth every 6 (six) hours as needed for nausea.    Dispense:  30 tablet    Refill:  2    Order Specific Question:   Supervising Provider    Answer:   Lazaro Arms [[id number]]   Labs/procedures today: factor 8, GBS, GC, CHL  Plan:  Continue routine obstetrical care  Next visit: prefers in person    Reviewed: Term labor symptoms and general obstetric precautions including but not limited to vaginal bleeding, contractions, leaking of fluid and fetal movement were reviewed in detail with the patient.  All questions were answered. Has home bp cuff.. Check bp weekly, let us know if >140/90.   Follow-up: Return for weekly LROBX4.  Orders Placed This Encounter  Procedures   Culture, beta strep (group b only)   Jacklyn Shell DNP, CNM 08/10/2021 10:26 AM j

## 2021-08-10 NOTE — Patient Instructions (Signed)

## 2021-08-11 LAB — CERVICOVAGINAL ANCILLARY ONLY
Chlamydia: NEGATIVE
Comment: NEGATIVE
Comment: NORMAL
Neisseria Gonorrhea: NEGATIVE

## 2021-08-11 LAB — FACTOR 8 RISTOCETIN COFACTOR: Von Willebrand Factor: 179 % (ref 50–200)

## 2021-08-14 LAB — CULTURE, BETA STREP (GROUP B ONLY): Strep Gp B Culture: NEGATIVE

## 2021-08-17 ENCOUNTER — Encounter: Payer: Self-pay | Admitting: Obstetrics & Gynecology

## 2021-08-17 ENCOUNTER — Ambulatory Visit (INDEPENDENT_AMBULATORY_CARE_PROVIDER_SITE_OTHER): Payer: 59 | Admitting: Obstetrics & Gynecology

## 2021-08-17 VITALS — BP 123/75 | HR 95 | Wt 174.0 lb

## 2021-08-17 DIAGNOSIS — Z3A37 37 weeks gestation of pregnancy: Secondary | ICD-10-CM

## 2021-08-17 DIAGNOSIS — D6800 Von Willebrand disease, unspecified: Secondary | ICD-10-CM

## 2021-08-17 DIAGNOSIS — Z3480 Encounter for supervision of other normal pregnancy, unspecified trimester: Secondary | ICD-10-CM

## 2021-08-17 NOTE — Progress Notes (Signed)
   LOW-RISK PREGNANCY VISIT Patient name: Nicole Freeman MRN [id number]  Date of birth: 16-Apr-1995 Chief Complaint:   Routine Prenatal Visit  History of Present Illness:   Nicole Freeman is a 26 y.o. G2P2002 female at [redacted]w[redacted]d with an Estimated Date of Delivery: 09/04/21 being seen today for ongoing management of a low-risk pregnancy.     06/01/2021    9:41 AM 02/23/2021   11:26 AM 07/21/2020    1:11 PM 05/12/2018    2:55 PM  Depression screen PHQ 2/9  Decreased Interest 0 0 0 0  Down, Depressed, Hopeless 0 0 0 0  PHQ - 2 Score 0 0 0 0  Altered sleeping 0 0  0  Tired, decreased energy 0 0  0  Change in appetite 0 0  0  Feeling bad or failure about yourself  0 0  0  Trouble concentrating 0 0  0  Moving slowly or fidgety/restless 0 0  0  Suicidal thoughts 0 0  0  PHQ-9 Score 0 0  0    Today she reports  pelvic pressure . Contractions: Irritability. Vag. Bleeding: None.  Movement: Present. denies leaking of fluid. Review of Systems:   Pertinent items are noted in HPI Denies abnormal vaginal discharge w/ itching/odor/irritation, headaches, visual changes, shortness of breath, chest pain, abdominal pain, severe nausea/vomiting, or problems with urination or bowel movements unless otherwise stated above. Pertinent History Reviewed:  Reviewed past medical,surgical, social, obstetrical and family history.  Reviewed problem list, medications and allergies. Physical Assessment:   Vitals:   08/17/21 0958  BP: 123/75  Pulse: 95  Weight: 174 lb (78.9 kg)  Body mass index is 31.83 kg/m.        Physical Examination:   General appearance: Well appearing, and in no distress  Mental status: Alert, oriented to person, place, and time  Skin: Warm & dry  Cardiovascular: Normal heart rate noted  Respiratory: Normal respiratory effort, no distress  Abdomen: Soft, gravid, nontender  Pelvic: Cervical exam performed  Dilation: 2 Effacement (%): Thick Station:  -2  Extremities: Edema: None  Fetal Status: Fetal Heart Rate (bpm): 144 Fundal Height: 34 cm Movement: Present Presentation: Vertex  Chaperone: Amanda Rash    No results found for this or any previous visit (from the past 24 hour(s)).  Assessment & Plan:  1) Low-risk pregnancy G3P2002 at [redacted]w[redacted]d with an Estimated Date of Delivery: 09/04/21   2) normal Factor 8 activity,    Meds: No orders of the defined types were placed in this encounter.  Labs/procedures today:   Plan:  Continue routine obstetrical care  Next visit: prefers in person    Reviewed: Term labor symptoms and general obstetric precautions including but not limited to vaginal bleeding, contractions, leaking of fluid and fetal movement were reviewed in detail with the patient.  All questions were answered. Has home bp cuff. Rx faxed to . Check bp weekly, let us know if >140/90.   Follow-up: Return in about 1 week (around 08/24/2021) for LROB.  No orders of the defined types were placed in this encounter.   Lazaro Arms, MD 08/17/2021 10:15 AM

## 2021-08-21 ENCOUNTER — Encounter: Payer: Self-pay | Admitting: Women's Health

## 2021-08-24 ENCOUNTER — Encounter: Payer: Self-pay | Admitting: Advanced Practice Midwife

## 2021-08-24 ENCOUNTER — Ambulatory Visit (INDEPENDENT_AMBULATORY_CARE_PROVIDER_SITE_OTHER): Payer: 59 | Admitting: Advanced Practice Midwife

## 2021-08-24 VITALS — BP 121/74 | HR 93 | Wt 174.0 lb

## 2021-08-24 DIAGNOSIS — Z3480 Encounter for supervision of other normal pregnancy, unspecified trimester: Secondary | ICD-10-CM

## 2021-08-24 DIAGNOSIS — Z3A38 38 weeks gestation of pregnancy: Secondary | ICD-10-CM

## 2021-08-24 NOTE — Progress Notes (Signed)
   LOW-RISK PREGNANCY VISIT Patient name: Nicole Freeman MRN [id number]  Date of birth: 1995/10/23 Chief Complaint:   Routine Prenatal Visit (Cervix check)  History of Present Illness:   Nicole Freeman is a 26 y.o. G29P2002 female at [redacted]w[redacted]d with an Estimated Date of Delivery: 09/04/21 being seen today for ongoing management of a low-risk pregnancy.  Today she reports no complaints. Contractions: Irritability.  .  Movement: Present. denies leaking of fluid. Review of Systems:   Pertinent items are noted in HPI Denies abnormal vaginal discharge w/ itching/odor/irritation, headaches, visual changes, shortness of breath, chest pain, abdominal pain, severe nausea/vomiting, or problems with urination or bowel movements unless otherwise stated above. Pertinent History Reviewed:  Reviewed past medical,surgical, social, obstetrical and family history.  Reviewed problem list, medications and allergies. Physical Assessment:   Vitals:   08/24/21 1501  BP: 121/74  Pulse: 93  Weight: 174 lb (78.9 kg)  Body mass index is 31.83 kg/m.        Physical Examination:   General appearance: Well appearing, and in no distress  Mental status: Alert, oriented to person, place, and time  Skin: Warm & dry  Cardiovascular: Normal heart rate noted  Respiratory: Normal respiratory effort, no distress  Abdomen: Soft, gravid, nontender  Pelvic: Cervical exam performed  Dilation: 4 Effacement (%): Thick Station: -2  Extremities: Edema: None  Fetal Status: Fetal Heart Rate (bpm): 150 Fundal Height: 37 cm Movement: Present Presentation: Vertex  Chaperone:  Erin, RN     No results found for this or any previous visit (from the past 24 hour(s)).  Assessment & Plan:  1) Low-risk pregnancy G3P2002 at [redacted]w[redacted]d with an Estimated Date of Delivery: 09/04/21   2) VWD, Factor 8 activity normal last week   Meds: No orders of the defined types were placed in this encounter.  Labs/procedures today:    Plan:  Continue routine obstetrical care  Next visit: prefers in person    Reviewed: Term labor symptoms and general obstetric precautions including but not limited to vaginal bleeding, contractions, leaking of fluid and fetal movement were reviewed in detail with the patient.  All questions were answered. Has home bp cuff.. Check bp weekly, let us know if >140/90.   Follow-up: weekly No orders of the defined types were placed in this encounter.  Jacklyn Shell DNP, CNM 08/24/2021 3:25 PM

## 2021-08-31 ENCOUNTER — Encounter: Payer: Self-pay | Admitting: Advanced Practice Midwife

## 2021-08-31 ENCOUNTER — Ambulatory Visit (INDEPENDENT_AMBULATORY_CARE_PROVIDER_SITE_OTHER): Payer: 59 | Admitting: Advanced Practice Midwife

## 2021-08-31 VITALS — BP 101/70 | HR 79 | Wt 174.4 lb

## 2021-08-31 DIAGNOSIS — Z3480 Encounter for supervision of other normal pregnancy, unspecified trimester: Secondary | ICD-10-CM

## 2021-08-31 DIAGNOSIS — Z3A39 39 weeks gestation of pregnancy: Secondary | ICD-10-CM

## 2021-08-31 NOTE — Patient Instructions (Signed)

## 2021-08-31 NOTE — Progress Notes (Signed)
   LOW-RISK PREGNANCY VISIT Patient name: Nicole Freeman MRN [id number]  Date of birth: 28-Jul-1995 Chief Complaint:   Routine Prenatal Visit (Cervix check)  History of Present Illness:   Nicole Freeman is a 26 y.o. G44P2002 female at [redacted]w[redacted]d with an Estimated Date of Delivery: 09/04/21 being seen today for ongoing management of a low-risk pregnancy.  Today she reports no complaints. Contractions: Not present.  .  Movement: Present. denies leaking of fluid. Review of Systems:   Pertinent items are noted in HPI Denies abnormal vaginal discharge w/ itching/odor/irritation, headaches, visual changes, shortness of breath, chest pain, abdominal pain, severe nausea/vomiting, or problems with urination or bowel movements unless otherwise stated above. Pertinent History Reviewed:  Reviewed past medical,surgical, social, obstetrical and family history.  Reviewed problem list, medications and allergies. Physical Assessment:   Vitals:   08/31/21 0853  BP: 101/70  Pulse: 79  Weight: 174 lb 6.4 oz (79.1 kg)  Body mass index is 31.9 kg/m.        Physical Examination:   General appearance: Well appearing, and in no distress  Mental status: Alert, oriented to person, place, and time  Skin: Warm & dry  Cardiovascular: Normal heart rate noted  Respiratory: Normal respiratory effort, no distress  Abdomen: Soft, gravid, nontender  Pelvic: Cervical exam performed  Dilation: 4 Effacement (%): Thick Station: -2  Extremities: Edema: None  Fetal Status: Fetal Heart Rate (bpm): 145 Fundal Height: 38 cm Movement: Present Presentation: Vertex  membranes stripped  Chaperone: Latisha Cresenzo    No results found for this or any previous visit (from the past 24 hour(s)).  Assessment & Plan:  1) Low-risk pregnancy G3P2002 at [redacted]w[redacted]d with an Estimated Date of Delivery: 09/04/21     Meds: No orders of the defined types were placed in this encounter.  Labs/procedures today:   Plan:   Continue routine obstetrical care  Next visit: prefers in person    Reviewed: Term labor symptoms and general obstetric precautions including but not limited to vaginal bleeding, contractions, leaking of fluid and fetal movement were reviewed in detail with the patient.  All questions were answered. Has home bp cuff.. Check bp weekly, let us know if >140/90.   Follow-up: Return in about 1 week (around 09/07/2021) for LROB, NST.  No orders of the defined types were placed in this encounter.  Jacklyn Shell DNP, CNM 08/31/2021 9:39 AM

## 2021-09-06 ENCOUNTER — Encounter: Payer: Self-pay | Admitting: Advanced Practice Midwife

## 2021-09-06 ENCOUNTER — Other Ambulatory Visit: Payer: 59

## 2021-09-06 ENCOUNTER — Encounter (HOSPITAL_COMMUNITY): Payer: Self-pay | Admitting: Obstetrics and Gynecology

## 2021-09-06 ENCOUNTER — Inpatient Hospital Stay (EMERGENCY_DEPARTMENT_HOSPITAL)
Admission: AD | Admit: 2021-09-06 | Discharge: 2021-09-06 | Disposition: A | Discharge status: Home / Self Care | Payer: 59 | Source: Home / Self Care | Attending: Obstetrics and Gynecology | Admitting: Obstetrics and Gynecology

## 2021-09-06 ENCOUNTER — Ambulatory Visit (INDEPENDENT_AMBULATORY_CARE_PROVIDER_SITE_OTHER): Payer: 59 | Admitting: Advanced Practice Midwife

## 2021-09-06 VITALS — BP 125/82 | HR 97 | Wt 172.6 lb

## 2021-09-06 DIAGNOSIS — Z3480 Encounter for supervision of other normal pregnancy, unspecified trimester: Secondary | ICD-10-CM

## 2021-09-06 DIAGNOSIS — O480 Post-term pregnancy: Secondary | ICD-10-CM

## 2021-09-06 DIAGNOSIS — O479 False labor, unspecified: Secondary | ICD-10-CM | POA: Diagnosis not present

## 2021-09-06 DIAGNOSIS — O471 False labor at or after 37 completed weeks of gestation: Secondary | ICD-10-CM | POA: Insufficient documentation

## 2021-09-06 DIAGNOSIS — Z3493 Encounter for supervision of normal pregnancy, unspecified, third trimester: Secondary | ICD-10-CM

## 2021-09-06 DIAGNOSIS — Z3A40 40 weeks gestation of pregnancy: Secondary | ICD-10-CM

## 2021-09-06 LAB — POCT FERN TEST
POCT Fern Test: NEGATIVE
POCT Fern Test: NEGATIVE

## 2021-09-06 NOTE — MAU Provider Note (Signed)
Event Date/Time   First Provider Initiated Contact with Patient 09/06/21 2210       S: [name] is a 26 y.o. G3P2002 at 106w2d  who presents to MAU today complaining of leaking of fluid since 1900. She denies vaginal bleeding. She endorses contractions, however they are not painful. She reports normal fetal movement.    O: BP 119/87   Pulse 98   LMP 11/28/2020 (Exact Date)   SpO2 99%  GENERAL: Well-developed, well-nourished female in no acute distress.  HEAD: Normocephalic, atraumatic.  CHEST: Normal effort of breathing, regular heart rate ABDOMEN: Soft, nontender, gravid PELVIC: Normal external female genitalia. Vagina is pink and rugated. Cervix with normal contour, no lesions. Normal discharge.  Negative pooling.   Cervical exam:  Dilation: 4.5 Effacement (%): 70 Presentation: Vertex Exam by:: Dorathy Daft, CNM   Fetal Monitoring: Baseline: 150bpm   Variability: moderate Accelerations: present Decelerations: absent Contractions: regular at 3-5 mins. Mild to palpation   Results for orders placed or performed during the hospital encounter of 09/06/21 (from the past 24 hour(s))  POCT fern test     Status: None   Collection Time: 09/06/21  9:38 PM  Result Value Ref Range   POCT Fern Test Negative = intact amniotic membranes     A: SIUP at [redacted]w[redacted]d  Membranes intact - Negative fern x2 - CNM offered patient the option of reassessment or discharge and through shared decision making patient desires to go home. CNM agreeable to plan of care.   P: 1. False labor   2. [redacted] weeks gestation of pregnancy   3. Intact amniotic membranes during pregnancy in third trimester   - Discussed that patient is not in active labor at the moment. Most likely early labor. Discussed the unpredictability of early labor and signs and symptoms of active labor.  - Questions on when to return answered at the bedside.  - FHT: Cat I upon discharge  - Labor precautions reviewed  prior to discharge.  - Patient discharged home in stable condition and may return to MAU as needed.   Carlynn Herald, CNM 09/06/2021 10:10 PM

## 2021-09-06 NOTE — Progress Notes (Signed)
   LOW-RISK PREGNANCY VISIT Patient name: Nicole Freeman MRN [id number]  Date of birth: [birth year] Chief Complaint:   Routine Prenatal Visit  History of Present Illness:   Nicole Freeman is a 26 y.o. G39P2002 female at [redacted]w[redacted]d with an Estimated Date of Delivery: 09/04/21 being seen today for ongoing management of a low-risk pregnancy.  Today she reports  doing well but ready! . Contractions: Not present.  .  Movement: Present. denies leaking of fluid. Review of Systems:   Pertinent items are noted in HPI Denies abnormal vaginal discharge w/ itching/odor/irritation, headaches, visual changes, shortness of breath, chest pain, abdominal pain, severe nausea/vomiting, or problems with urination or bowel movements unless otherwise stated above. Pertinent History Reviewed:  Reviewed past medical,surgical, social, obstetrical and family history.  Reviewed problem list, medications and allergies. Physical Assessment:   Vitals:   09/06/21 1353  BP: 125/82  Pulse: 97  Weight: 172 lb 9.6 oz (78.3 kg)  Body mass index is 31.57 kg/m.        Physical Examination:   General appearance: Well appearing, and in no distress  Mental status: Alert, oriented to person, place, and time  Skin: Warm & dry  Cardiovascular: Normal heart rate noted  Respiratory: Normal respiratory effort, no distress  Abdomen: Soft, gravid, nontender  Pelvic: Cervical exam performed  Dilation: 4.5 Effacement (%): Thick Station: -2  Extremities: Edema: None  Fetal Status: Fetal Heart Rate (bpm): 130 NST   Movement: Present Presentation: Vertex  NST: baseline 125-130s, +accels, no decels, no ctx (reactive)  No results found for this or any previous visit (from the past 24 hour(s)).  Assessment & Plan:  1) Low-risk pregnancy G3P2002 at [redacted]w[redacted]d with an Estimated Date of Delivery: 09/04/21   2) Scheduled for postdates IOL,  IOL form faxed via Epic and orders placed for August 6th in the morning   Meds:  No orders of the defined types were placed in this encounter.  Labs/procedures today: NST  Plan:  Continue routine obstetrical care   Reviewed: Preterm labor symptoms and general obstetric precautions including but not limited to vaginal bleeding, contractions, leaking of fluid and fetal movement were reviewed in detail with the patient.  All questions were answered. Has home bp cuff. Check bp weekly, let us know if >140/90.   Follow-up: Return in about 6 weeks (around 10/18/2021) for postpartum visit.  No orders of the defined types were placed in this encounter.  Arabella Merles CNM 09/06/2021 3:45 PM

## 2021-09-06 NOTE — MAU Note (Signed)
..  Nicole Freeman is a 26 y.o. at [redacted]w[redacted]d here in MAU reporting: She got out of the pool and felt a small gush of fluid, is not sure if she is still leaking. Reports she had her membranes stripped earlier today and has been contracting since then but the contractions have been more intense for two hours, but they are still irregular.   Pain score: none now but 8/10 when she has a ctx  LMR:AJHHIDU in room  Lab orders placed from triage:  fern

## 2021-09-06 NOTE — Patient Instructions (Signed)
Your induction is scheduled for August 6th in the morning. Please DO NOT show up at the time you see in MyChart. Someone from Labor & Delivery will call you on the date of your induction to let you know what time to come in. Please keep your phone on and with you at all times, you have 1 hour to respond to them to let them know you are on your way.  Go to the main desk at the [facility] and let them know you are there to be induced. They will send someone from Labor & Delivery to come get you.  You will get a call from a nurse from the hospital within the next day or so to go over some information, she will also schedule you to go to the hospital a few days before you induction to have your Covid test. If you have any questions, please let us know.

## 2021-09-07 ENCOUNTER — Inpatient Hospital Stay (HOSPITAL_COMMUNITY): Payer: 59 | Admitting: Anesthesiology

## 2021-09-07 ENCOUNTER — Other Ambulatory Visit: Payer: Self-pay

## 2021-09-07 ENCOUNTER — Inpatient Hospital Stay (HOSPITAL_COMMUNITY)
Admission: AD | Admit: 2021-09-07 | Discharge: 2021-09-08 | DRG: 805 | Disposition: A | Discharge status: Home / Self Care | Payer: 59 | Attending: Obstetrics and Gynecology | Admitting: Obstetrics and Gynecology

## 2021-09-07 ENCOUNTER — Encounter (HOSPITAL_COMMUNITY): Payer: Self-pay | Admitting: Obstetrics and Gynecology

## 2021-09-07 ENCOUNTER — Encounter (HOSPITAL_COMMUNITY): Payer: Self-pay

## 2021-09-07 ENCOUNTER — Telehealth (HOSPITAL_COMMUNITY): Payer: Self-pay | Admitting: *Deleted

## 2021-09-07 DIAGNOSIS — D6800 Von Willebrand disease, unspecified: Secondary | ICD-10-CM | POA: Diagnosis present

## 2021-09-07 DIAGNOSIS — O9912 Other diseases of the blood and blood-forming organs and certain disorders involving the immune mechanism complicating childbirth: Secondary | ICD-10-CM | POA: Diagnosis present

## 2021-09-07 DIAGNOSIS — G43909 Migraine, unspecified, not intractable, without status migrainosus: Secondary | ICD-10-CM | POA: Diagnosis present

## 2021-09-07 DIAGNOSIS — Z3480 Encounter for supervision of other normal pregnancy, unspecified trimester: Secondary | ICD-10-CM

## 2021-09-07 DIAGNOSIS — Z3A40 40 weeks gestation of pregnancy: Secondary | ICD-10-CM

## 2021-09-07 DIAGNOSIS — G935 Compression of brain: Secondary | ICD-10-CM | POA: Diagnosis present

## 2021-09-07 DIAGNOSIS — Z30430 Encounter for insertion of intrauterine contraceptive device: Secondary | ICD-10-CM

## 2021-09-07 DIAGNOSIS — O99892 Other specified diseases and conditions complicating childbirth: Secondary | ICD-10-CM | POA: Diagnosis present

## 2021-09-07 DIAGNOSIS — O480 Post-term pregnancy: Secondary | ICD-10-CM

## 2021-09-07 DIAGNOSIS — Z87891 Personal history of nicotine dependence: Secondary | ICD-10-CM | POA: Diagnosis not present

## 2021-09-07 LAB — CBC
HCT: 32.2 % — ABNORMAL LOW (ref 36.0–46.0)
Hemoglobin: 10.5 g/dL — ABNORMAL LOW (ref 12.0–15.0)
MCH: 27.9 pg (ref 26.0–34.0)
MCHC: 32.6 g/dL (ref 30.0–36.0)
MCV: 85.4 fL (ref 80.0–100.0)
Platelets: 259 10*3/uL (ref 150–400)
RBC: 3.77 MIL/uL — ABNORMAL LOW (ref 3.87–5.11)
RDW: 13.7 % (ref 11.5–15.5)
WBC: 9.7 10*3/uL (ref 4.0–10.5)
nRBC: 0 % (ref 0.0–0.2)

## 2021-09-07 LAB — TYPE AND SCREEN
ABO/RH(D): A POS
Antibody Screen: NEGATIVE

## 2021-09-07 LAB — RPR: RPR Ser Ql: NONREACTIVE

## 2021-09-07 MED ORDER — TRANEXAMIC ACID-NACL 1000-0.7 MG/100ML-% IV SOLN
1000.0000 mg | INTRAVENOUS | Status: AC
  Administered 2021-09-07: 1000 mg via INTRAVENOUS

## 2021-09-07 MED ORDER — OXYTOCIN-SODIUM CHLORIDE 30-0.9 UT/500ML-% IV SOLN: 1.0000 m[IU]/min | INTRAVENOUS | Status: DC

## 2021-09-07 MED ORDER — OXYCODONE-ACETAMINOPHEN 5-325 MG PO TABS: 2.0000 | ORAL_TABLET | ORAL | Status: DC | PRN

## 2021-09-07 MED ORDER — OXYTOCIN BOLUS FROM INFUSION
333.0000 mL | Freq: Once | INTRAVENOUS | Status: AC
  Administered 2021-09-07: 333 mL via INTRAVENOUS

## 2021-09-07 MED ORDER — BENZOCAINE-MENTHOL 20-0.5 % EX AERO: 1.0000 | INHALATION_SPRAY | CUTANEOUS | Status: DC | PRN

## 2021-09-07 MED ORDER — OXYCODONE-ACETAMINOPHEN 5-325 MG PO TABS: 1.0000 | ORAL_TABLET | ORAL | Status: DC | PRN

## 2021-09-07 MED ORDER — VERAPAMIL HCL ER 120 MG PO TBCR
120.0000 mg | EXTENDED_RELEASE_TABLET | Freq: Every day | ORAL | Status: DC
  Filled 2021-09-07: qty 1

## 2021-09-07 MED ORDER — FENTANYL CITRATE (PF) 100 MCG/2ML IJ SOLN: 100.0000 ug | INTRAMUSCULAR | Status: DC | PRN

## 2021-09-07 MED ORDER — TETANUS-DIPHTH-ACELL PERTUSSIS 5-2.5-18.5 LF-MCG/0.5 IM SUSY: 0.5000 mL | PREFILLED_SYRINGE | Freq: Once | INTRAMUSCULAR | Status: DC

## 2021-09-07 MED ORDER — LIDOCAINE HCL (PF) 1 % IJ SOLN
INTRAMUSCULAR | Status: DC | PRN
  Administered 2021-09-07: 2 mL via EPIDURAL
  Administered 2021-09-07: 10 mL via EPIDURAL

## 2021-09-07 MED ORDER — SOD CITRATE-CITRIC ACID 500-334 MG/5ML PO SOLN: 30.0000 mL | ORAL | Status: DC | PRN

## 2021-09-07 MED ORDER — OXYCODONE HCL 5 MG PO TABS: 10.0000 mg | ORAL_TABLET | ORAL | Status: DC | PRN

## 2021-09-07 MED ORDER — LEVONORGESTREL 20 MCG/DAY IU IUD
1.0000 | INTRAUTERINE_SYSTEM | Freq: Once | INTRAUTERINE | Status: AC
  Administered 2021-09-07: 1 via INTRAUTERINE
  Filled 2021-09-07: qty 1

## 2021-09-07 MED ORDER — LACTATED RINGERS IV SOLN: INTRAVENOUS | Status: DC

## 2021-09-07 MED ORDER — COCONUT OIL OIL: 1.0000 | TOPICAL_OIL | Status: DC | PRN

## 2021-09-07 MED ORDER — LACTATED RINGERS IV SOLN
500.0000 mL | Freq: Once | INTRAVENOUS | Status: AC
  Administered 2021-09-07: 500 mL via INTRAVENOUS

## 2021-09-07 MED ORDER — OXYCODONE HCL 5 MG PO TABS
5.0000 mg | ORAL_TABLET | ORAL | Status: DC | PRN
  Administered 2021-09-07: 5 mg via ORAL
  Filled 2021-09-07: qty 1

## 2021-09-07 MED ORDER — ONDANSETRON HCL 4 MG PO TABS: 4.0000 mg | ORAL_TABLET | ORAL | Status: DC | PRN

## 2021-09-07 MED ORDER — PHENYLEPHRINE 80 MCG/ML (10ML) SYRINGE FOR IV PUSH (FOR BLOOD PRESSURE SUPPORT): 80.0000 ug | PREFILLED_SYRINGE | INTRAVENOUS | Status: DC | PRN

## 2021-09-07 MED ORDER — ZOLPIDEM TARTRATE 5 MG PO TABS: 5.0000 mg | ORAL_TABLET | Freq: Every evening | ORAL | Status: DC | PRN

## 2021-09-07 MED ORDER — EPHEDRINE 5 MG/ML INJ: 10.0000 mg | INTRAVENOUS | Status: DC | PRN

## 2021-09-07 MED ORDER — SODIUM CHLORIDE 0.9 % IV SOLN
INTRAVENOUS | Status: DC | PRN
Start: 2021-09-07 — End: 2021-09-08

## 2021-09-07 MED ORDER — TERBUTALINE SULFATE 1 MG/ML IJ SOLN: 0.2500 mg | Freq: Once | INTRAMUSCULAR | Status: DC | PRN

## 2021-09-07 MED ORDER — ACETAMINOPHEN 325 MG PO TABS
650.0000 mg | ORAL_TABLET | ORAL | Status: DC | PRN
  Administered 2021-09-07 – 2021-09-08 (×2): 650 mg via ORAL
  Filled 2021-09-07 (×2): qty 2

## 2021-09-07 MED ORDER — DIBUCAINE (PERIANAL) 1 % EX OINT: 1.0000 | TOPICAL_OINTMENT | CUTANEOUS | Status: DC | PRN

## 2021-09-07 MED ORDER — FENTANYL-BUPIVACAINE-NACL 0.5-0.125-0.9 MG/250ML-% EP SOLN
12.0000 mL/h | EPIDURAL | Status: DC | PRN
  Filled 2021-09-07: qty 250

## 2021-09-07 MED ORDER — SIMETHICONE 80 MG PO CHEW: 80.0000 mg | CHEWABLE_TABLET | ORAL | Status: DC | PRN

## 2021-09-07 MED ORDER — ONDANSETRON HCL 4 MG/2ML IJ SOLN: 4.0000 mg | INTRAMUSCULAR | Status: DC | PRN

## 2021-09-07 MED ORDER — IBUPROFEN 600 MG PO TABS
600.0000 mg | ORAL_TABLET | Freq: Four times a day (QID) | ORAL | Status: DC
  Administered 2021-09-07 – 2021-09-08 (×5): 600 mg via ORAL
  Filled 2021-09-07 (×4): qty 1

## 2021-09-07 MED ORDER — LIDOCAINE-EPINEPHRINE (PF) 2 %-1:200000 IJ SOLN
INTRAMUSCULAR | Status: DC | PRN
  Administered 2021-09-07: 5 mL via EPIDURAL

## 2021-09-07 MED ORDER — SENNOSIDES-DOCUSATE SODIUM 8.6-50 MG PO TABS
2.0000 | ORAL_TABLET | ORAL | Status: DC
  Administered 2021-09-07: 2 via ORAL
  Filled 2021-09-07: qty 2

## 2021-09-07 MED ORDER — ONDANSETRON HCL 4 MG/2ML IJ SOLN: 4.0000 mg | Freq: Four times a day (QID) | INTRAMUSCULAR | Status: DC | PRN

## 2021-09-07 MED ORDER — FENTANYL CITRATE (PF) 100 MCG/2ML IJ SOLN
INTRAMUSCULAR | Status: DC | PRN
  Administered 2021-09-07: 100 ug via EPIDURAL

## 2021-09-07 MED ORDER — FENTANYL CITRATE (PF) 100 MCG/2ML IJ SOLN
INTRAMUSCULAR | Status: AC
  Filled 2021-09-07: qty 2

## 2021-09-07 MED ORDER — TRANEXAMIC ACID 650 MG PO TABS
1950.0000 mg | ORAL_TABLET | Freq: Three times a day (TID) | ORAL | Status: DC
  Administered 2021-09-07 – 2021-09-08 (×3): 1950 mg via ORAL
  Filled 2021-09-07 (×7): qty 3

## 2021-09-07 MED ORDER — TRANEXAMIC ACID-NACL 1000-0.7 MG/100ML-% IV SOLN
INTRAVENOUS | Status: AC
  Filled 2021-09-07: qty 100

## 2021-09-07 MED ORDER — LIDOCAINE HCL (PF) 1 % IJ SOLN: 30.0000 mL | INTRAMUSCULAR | Status: DC | PRN

## 2021-09-07 MED ORDER — DIPHENHYDRAMINE HCL 50 MG/ML IJ SOLN: 12.5000 mg | INTRAMUSCULAR | Status: DC | PRN

## 2021-09-07 MED ORDER — SODIUM CHLORIDE 0.9% FLUSH: 3.0000 mL | INTRAVENOUS | Status: DC | PRN

## 2021-09-07 MED ORDER — DIPHENHYDRAMINE HCL 25 MG PO CAPS: 25.0000 mg | ORAL_CAPSULE | Freq: Four times a day (QID) | ORAL | Status: DC | PRN

## 2021-09-07 MED ORDER — ACETAMINOPHEN 325 MG PO TABS: 650.0000 mg | ORAL_TABLET | ORAL | Status: DC | PRN

## 2021-09-07 MED ORDER — OXYTOCIN-SODIUM CHLORIDE 30-0.9 UT/500ML-% IV SOLN
2.5000 [IU]/h | INTRAVENOUS | Status: DC
  Administered 2021-09-07: 2.5 [IU]/h via INTRAVENOUS
  Filled 2021-09-07: qty 500

## 2021-09-07 MED ORDER — WITCH HAZEL-GLYCERIN EX PADS: 1.0000 | MEDICATED_PAD | CUTANEOUS | Status: DC | PRN

## 2021-09-07 MED ORDER — SODIUM CHLORIDE 0.9% FLUSH
3.0000 mL | Freq: Two times a day (BID) | INTRAVENOUS | Status: DC
Start: 2021-09-07 — End: 2021-09-08

## 2021-09-07 MED ORDER — PRENATAL MULTIVITAMIN CH
1.0000 | ORAL_TABLET | Freq: Every day | ORAL | Status: DC
  Administered 2021-09-07 – 2021-09-08 (×2): 1 via ORAL
  Filled 2021-09-07 (×2): qty 1

## 2021-09-07 MED ORDER — FENTANYL-BUPIVACAINE-NACL 0.5-0.125-0.9 MG/250ML-% EP SOLN
EPIDURAL | Status: DC | PRN
  Administered 2021-09-07: 12 mL/h via EPIDURAL

## 2021-09-07 MED ORDER — LACTATED RINGERS IV SOLN
500.0000 mL | INTRAVENOUS | Status: DC | PRN
  Administered 2021-09-07: 1000 mL via INTRAVENOUS

## 2021-09-07 MED ORDER — MEASLES, MUMPS & RUBELLA VAC IJ SOLR: 0.5000 mL | Freq: Once | INTRAMUSCULAR | Status: DC

## 2021-09-07 NOTE — Anesthesia Postprocedure Evaluation (Signed)
Anesthesia Post Note  Patient: Nicole Freeman  Procedure(s) Performed: AN AD HOC LABOR EPIDURAL     Patient location during evaluation: Mother Baby Anesthesia Type: Epidural Level of consciousness: awake Pain management: satisfactory to patient Vital Signs Assessment: post-procedure vital signs reviewed and stable Respiratory status: spontaneous breathing Cardiovascular status: stable Anesthetic complications: no   No notable events documented.  Last Vitals:  Vitals:   09/07/21 1245 09/07/21 1633  BP: 113/70 114/70  Pulse: 79 86  Resp: 16 16  Temp: 36.9 C 36.8 C  SpO2:  99%    Last Pain:  Vitals:   09/07/21 1633  TempSrc: Oral  PainSc: 6    Pain Goal:                Epidural/Spinal Function Cutaneous sensation: Normal sensation (09/07/21 1633), Patient able to flex knees: Yes (09/07/21 1633), Patient able to lift hips off bed: Yes (09/07/21 1633), Back pain beyond tenderness at insertion site: No (09/07/21 1633), Progressively worsening motor and/or sensory loss: No (09/07/21 1633), Bowel and/or bladder incontinence post epidural: No (09/07/21 1633)  Cephus Shelling

## 2021-09-07 NOTE — Anesthesia Procedure Notes (Signed)
Epidural Patient location during procedure: OB Start time: 09/07/2021 7:41 AM End time: 09/07/2021 7:53 AM  Staffing Anesthesiologist: Lannie Fields, DO Performed: anesthesiologist   Preanesthetic Checklist Completed: patient identified, IV checked, risks and benefits discussed, monitors and equipment checked, pre-op evaluation and timeout performed  Epidural Patient position: sitting Prep: DuraPrep and site prepped and draped Patient monitoring: continuous pulse ox, blood pressure, heart rate and cardiac monitor Approach: midline Location: L3-L4 Injection technique: LOR air  Needle:  Needle type: Tuohy  Needle gauge: 17 G Needle length: 9 cm Needle insertion depth: 7 cm Catheter type: closed end flexible Catheter size: 19 Gauge Catheter at skin depth: 12 cm Test dose: negative  Assessment Sensory level: T8 Events: blood not aspirated, injection not painful, no injection resistance, no paresthesia and negative IV test  Additional Notes Patient identified. Risks/Benefits/Options discussed with patient including but not limited to bleeding, infection, nerve damage, paralysis, failed block, incomplete pain control, headache, blood pressure changes, nausea, vomiting, reactions to medication both or allergic, itching and postpartum back pain. Confirmed with bedside nurse the patient's most recent platelet count. Confirmed with patient that they are not currently taking any anticoagulation, have any bleeding history or any family history of bleeding disorders. Patient expressed understanding and wished to proceed. All questions were answered. Sterile technique was used throughout the entire procedure. Please see nursing notes for vital signs. Test dose was given through epidural catheter and negative prior to continuing to dose epidural or start infusion. Warning signs of high block given to the patient including shortness of breath, tingling/numbness in hands, complete motor block,  or any concerning symptoms with instructions to call for help. Patient was given instructions on fall risk and not to get out of bed. All questions and concerns addressed with instructions to call with any issues or inadequate analgesia.  Reason for block:procedure for pain

## 2021-09-07 NOTE — Telephone Encounter (Signed)
Hello Nicole Freeman   I am trying to reach you to review your Medical History and answer any questions you have regarding your Induction of Labor.  My number is [phone].  I am in the office from 8:30-4:30 Monday thru Friday.    Hardie Pulley MSN [facility] Preadmission Nurse

## 2021-09-07 NOTE — MAU Note (Signed)
Nicole Freeman is a 26 y.o. at [redacted]w[redacted]d here in MAU reporting: contractions got more intense at 0530 and they are every 2 min. No bleeding or LOF. +FM.   Onset of complaint: ongoing  FHT: EFM applied in room  Lab orders placed from triage: none

## 2021-09-07 NOTE — Lactation Note (Signed)
This note was copied from a baby's chart. Lactation Consultation Note  Patient Name: Nicole Freeman [id number] Date: 09/07/2021 Reason for consult: Initial assessment;Term Age:27 hours  Mom would like to breastfeed but also later, pump and bottle feed.  She can hand express easily.  Mom states she can hear clicking with feedings. Baby swaddled in blankets asleep.  LC suggested STS.  Infant woke and attempted to latch but was unable to sustain deep latch.  Pillows provided and support given for deeper latch.  Baby began rhythmically sucking and multiple swallows heard, some clicking noted with feed.   Baby would come off and need to relatch a few times.    LC suggested a different position.  Football hold used.  Baby latched well and swallows easily heard but clicking with feed noted.    Baby is [place] ped. Pt.  Suggested follow up with Soyla Dryer for lactation visit if needed.    Resource sheet provided.  Encouraged STS, hand expression, feed with cues, call out for assistance if needed.  Maternal Data Has patient been taught Hand Expression?: Yes Does the patient have breastfeeding experience prior to this delivery?: Yes How long did the patient breastfeed?: fed couple of weeks with her first child and fed for 5 months with her second child  Feeding Mother's Current Feeding Choice: Breast Milk  LATCH Score Latch: Repeated attempts needed to sustain latch, nipple held in mouth throughout feeding, stimulation needed to elicit sucking reflex.  Audible Swallowing: A few with stimulation  Type of Nipple: Everted at rest and after stimulation  Comfort (Breast/Nipple): Soft / non-tender  Hold (Positioning): Assistance needed to correctly position infant at breast and maintain latch.  LATCH Score: 7   Lactation Tools Discussed/Used    Interventions Interventions: Breast feeding basics reviewed;Assisted with latch;Skin to skin;Expressed milk;Position  options;Support pillows;Adjust position;LC Services brochure  Discharge    Consult Status Consult Status: Follow-up Date: 09/08/21 Follow-up type: In-patient    Maryruth Hancock [facility] 09/07/2021, 7:34 PM

## 2021-09-07 NOTE — Anesthesia Preprocedure Evaluation (Signed)
Anesthesia Evaluation  Patient identified by MRN, date of birth, ID band Patient awake    Reviewed: Allergy & Precautions, Patient's Chart, lab work & pertinent test results  Airway Mallampati: II  TM Distance: >3 FB Neck ROM: Full    Dental no notable dental hx.    Pulmonary former smoker,    Pulmonary exam normal breath sounds clear to auscultation       Cardiovascular negative cardio ROS Normal cardiovascular exam Rhythm:Regular Rate:Normal     Neuro/Psych  Headaches, negative psych ROS   GI/Hepatic Neg liver ROS, GERD  Controlled and Medicated,  Endo/Other  negative endocrine ROS  Renal/GU negative Renal ROS  negative genitourinary   Musculoskeletal negative musculoskeletal ROS (+)   Abdominal   Peds  Hematology  (+) Blood dyscrasia, , Vwd, last factor 8 level 179 during pregnancy   Anesthesia Other Findings   Reproductive/Obstetrics (+) Pregnancy 3rd baby, 7.5cm- last labor was fast                             Anesthesia Physical Anesthesia Plan  ASA: 3  Anesthesia Plan: Epidural   Post-op Pain Management:    Induction:   PONV Risk Score and Plan: 2  Airway Management Planned: Natural Airway  Additional Equipment: None  Intra-op Plan:   Post-operative Plan:   Informed Consent: I have reviewed the patients History and Physical, chart, labs and discussed the procedure including the risks, benefits and alternatives for the proposed anesthesia with the patient or authorized representative who has indicated his/her understanding and acceptance.       Plan Discussed with:   Anesthesia Plan Comments: (Arrived in MAU at 7.5cm, d/t her that epidural may not set up in time prior to delivery given this is her 3rd baby and her last labor was fast.)        Anesthesia Quick Evaluation

## 2021-09-07 NOTE — H&P (Signed)
Nicole Freeman is a 26 y.o. G45P2002 female at [redacted]w[redacted]d by LMP c/w 9.4wk u/s presenting with reg contractions.   Reports active fetal movement, contractions: regular, every 2 minutes, vaginal bleeding: scant staining, membranes: intact.  Initiated prenatal care at [facility] at 12.3 wks.   Most recent u/s : anatomy scan at 18+3wks, EFW 88%, nl fluid, post placenta.   This pregnancy complicated by: # VonWillebrands dx # Chiari malformation # migraines  Prenatal History/Complications:  # vag del x 2 2013 and 2019, no complications (did not require DDAVP for VW)  Past Medical History: Past Medical History:  Diagnosis Date   Chiari malformation type I (HCC)    Headache    Von Willebrand disease (HCC)     Past Surgical History: Past Surgical History:  Procedure Laterality Date   WISDOM TOOTH EXTRACTION      Obstetrical History: OB History     Gravida  3   Para  2   Term  2   Preterm      AB      Living  2      SAB      IAB      Ectopic      Multiple  0   Live Births  2           Social History: Social History   Socioeconomic History   Marital status: Married    Spouse name: Not on file   Number of children: 2   Years of education: Not on file   Highest education level: Not on file  Occupational History   Not on file  Tobacco Use   Smoking status: Former    Packs/day: 0.50    Types: Cigarettes   Smokeless tobacco: Never   Tobacco comments:    quit April 2019  Vaping Use   Vaping Use: Never used  Substance and Sexual Activity   Alcohol use: Not Currently    Comment: once in a blue moon   Drug use: No   Sexual activity: Yes    Birth control/protection: None  Other Topics Concern   Not on file  Social History Narrative   Right Handed   Lives in a one story    Drinks Caffeine    Social Determinants of Health   Financial Resource Strain: Low Risk  (06/01/2021)   Overall Financial Resource Strain (CARDIA)    Difficulty of  Paying Living Expenses: Not hard at all  Food Insecurity: No Food Insecurity (06/01/2021)   Hunger Vital Sign    Worried About Running Out of Food in the Last Year: Never true    Ran Out of Food in the Last Year: Never true  Transportation Needs: No Transportation Needs (06/01/2021)   PRAPARE - Administrator, Civil Service (Medical): No    Lack of Transportation (Non-Medical): No  Physical Activity: Insufficiently Active (06/01/2021)   Exercise Vital Sign    Days of Exercise per Week: 4 days    Minutes of Exercise per Session: 30 min  Stress: No Stress Concern Present (06/01/2021)   [name] of Occupational Health - Occupational Stress Questionnaire    Feeling of Stress : Not at all  Social Connections: Moderately Integrated (06/01/2021)   Social Connection and Isolation Panel [NHANES]    Frequency of Communication with Friends and Family: More than three times a week    Frequency of Social Gatherings with Friends and Family: Three times a week    Attends Religious Services:  1 to 4 times per year    Active Member of Clubs or Organizations: No    Attends Banker Meetings: Never    Marital Status: Married    Family History: Family History  Problem Relation Age of Onset   Diabetes Maternal Aunt    Breast cancer Maternal Aunt    Breast cancer Paternal Aunt    Lung cancer Paternal Aunt    Other Maternal Grandmother        malformed kidney    Allergies: No Known Allergies  Medications Prior to Admission  Medication Sig Dispense Refill Last Dose   albuterol (VENTOLIN HFA) 108 (90 Base) MCG/ACT inhaler Inhale 1-2 puffs into the lungs every 6 (six) hours as needed for wheezing or shortness of breath. 1 each 0    cyclobenzaprine (FLEXERIL) 10 MG tablet Take 1 tablet (10 mg total) by mouth every 8 (eight) hours as needed for muscle spasms. 20 tablet 1    Doxylamine-Pyridoxine ER (BONJESTA) 20-20 MG TBCR Take by mouth.      ondansetron (ZOFRAN-ODT) 4 MG  disintegrating tablet Take 1 tablet (4 mg total) by mouth every 6 (six) hours as needed for nausea. 30 tablet 2    pantoprazole (PROTONIX) 20 MG tablet Take 1 tablet (20 mg total) by mouth daily. 30 tablet 6    Prenatal Vit-Fe Fumarate-FA (PRENATAL VITAMIN PO) Take by mouth.      promethazine (PHENERGAN) 25 MG tablet Take 25 mg by mouth daily as needed.      verapamil (CALAN-SR) 120 MG CR tablet Take 1 tablet (120 mg total) by mouth at bedtime. 30 tablet 5     Review of Systems  Pertinent pos/neg as indicated in HPI  Blood pressure 120/67, pulse 92, resp. rate 20, height 5\' 3"  (1.6 m), weight 79 kg, last menstrual period 11/28/2020, SpO2 99 %. General appearance: alert and mild distress Lungs: clear to auscultation bilaterally Heart: regular rate and rhythm Abdomen: gravid, soft, non-tender, EFW by Leopold's approximately 7lbs Extremities: 1+ edema  Fetal monitoring: FHR: 120s bpm, variability: moderate,  Accelerations: Present,  decelerations:  Absent Uterine activity: Frequency: Every 2-3 minutes Dilation: 7.5 Effacement (%): 90 Station: 0 Exam by:: holly flippin rn Presentation: cephalic   Prenatal labs: ABO, Rh: A/Positive/-- (01/19 1345) Antibody: Negative (04/27 0806) Rubella: 1.94 (01/19 1345) RPR: Non Reactive (04/27 0806)  HBsAg: Negative (01/19 1345)  HIV: Non Reactive (04/27 0806)  GBS: Negative/-- (07/06 1359)  2hr GTT: 73/96/91  Prenatal Transfer Tool  Maternal Diabetes: No Genetic Screening: Normal Maternal Ultrasounds/Referrals: Normal Fetal Ultrasounds or other Referrals:  None Maternal Substance Abuse:  No Significant Maternal Medications:  None Significant Maternal Lab Results: Group B Strep negative  Results for orders placed or performed during the hospital encounter of 09/06/21 (from the past 24 hour(s))  POCT fern test   Collection Time: 09/06/21  9:38 PM  Result Value Ref Range   POCT Fern Test Negative = intact amniotic membranes   Fern Test    Collection Time: 09/06/21  9:55 PM  Result Value Ref Range   POCT Fern Test Negative = intact amniotic membranes      Assessment:  [redacted]w[redacted]d SIUP  G3P2002  Active labor/transition  Cat 1 FHR  GBS Negative/-- (07/06 1359)  Plan:  Admit to L&D  IV pain meds/epidural prn active labor  Expectant management  Will discuss having DDAVP on standby prn bleeding at delivery  Anticipate vag del   Plans to breastfeed  Contraception: considering IUD  Arabella Merles CNM 09/07/2021, 7:17 AM

## 2021-09-07 NOTE — Progress Notes (Signed)
RN called [facility] to clarify orders,  if pt was to take oral TXA TID postpartum. Yes, per Dr. Ladon Applebaum MD.

## 2021-09-07 NOTE — Procedures (Signed)
  Post-Placental IUD Insertion Procedure Note  Patient identified, informed consent signed prior to delivery, signed copy in chart, time out was performed.    Vaginal, labial and perineal areas thoroughly inspected for lacerations. no laceration identified - prior to insertion of IUD.    Mirena IUD grasped between sterile gloved fingers. Sterile lubrication applied to sterile gloved hand for ease of insertion. Fundus identified through abdominal wall using non-insertion hand. IUD inserted to fundus with bimanual technique. IUD carefully released at the fundus and insertion hand gently removed from vagina.   Strings trimmed to the level of the introitus. Patient tolerated procedure well.  Lot # [id number] Expiration Date 11/06/2023  Patient given post procedure instructions and IUD care card with expiration date.  Patient is asked to keep IUD strings tucked in her vagina until her postpartum follow up visit in 4-6 weeks. Patient advised to abstain from sexual intercourse and pulling on strings before her follow-up visit. Patient verbalized an understanding of the plan of care and agrees.   Sheppard Evens MD MPH OB Fellow, [facility]

## 2021-09-07 NOTE — Discharge Summary (Signed)
Postpartum Discharge Summary     Patient Name: Nicole Freeman DOB: [birth year] MRN: [id number]  Date of admission: 09/07/2021 Delivery date:09/07/2021  Delivering provider: Stormy Card  Date of discharge: 09/08/2021  Admitting diagnosis: Post term pregnancy over 40 weeks [O48.0] Intrauterine pregnancy: [redacted]w[redacted]d    Secondary diagnosis:  Principal Problem:   SVD (spontaneous vaginal delivery) Active Problems:   Von Willebrand disease   [place] malformation [place] I [place]   Migraines   Post term pregnancy over 40 weeks   Encounter for insertion of mirena IUD  Additional problems: none    Discharge diagnosis: Term Pregnancy Delivered and Von Willebrand disease                                               Post partum procedures: none Augmentation: N/A Complications: None  Hospital course: Onset of Labor With Vaginal Delivery      26y.o. yo G3P3003 at 431w3das admitted in Active Labor on 09/07/2021. Patient had an uncomplicated labor course as follows:  Membrane Rupture Time/Date: 9:29 AM ,09/07/2021   Delivery Method:Vaginal, Spontaneous  Episiotomy: None  Lacerations:  None  Patient had an uncomplicated postpartum course.  She is ambulating, tolerating a regular diet, passing flatus, and urinating well. Patient is discharged home in stable condition on 09/08/21.  Newborn Data: Birth date:09/07/2021  Birth time:9:53 AM  Gender:Female  Living status:Living  Apgars:9 ,9  Weight:3500 g   Magnesium Sulfate received: No BMZ received: No Rhophylac:N/A MMR:N/A T-DaP:Given prenatally Flu: N/A Transfusion:No  Physical exam  Vitals:   09/07/21 1633 09/07/21 2021 09/08/21 0100 09/08/21 0600  BP: 114/70 116/71 111/72 116/73  Pulse: 86 65 81 78  Resp: 16 16 16 16   Temp: 98.2 F (36.8 C) 98.2 F (36.8 C) 97.7 F (36.5 C) 98.2 F (36.8 C)  TempSrc: Oral Oral Oral Oral  SpO2: 99% 100% 100% 100%  Weight:      Height:       General: alert, cooperative, and no  distress Lochia: appropriate Uterine Fundus: firm Incision: N/A DVT Evaluation: No evidence of DVT seen on physical exam. No cords or calf tenderness. No significant calf/ankle edema. Labs: Lab Results  Component Value Date   WBC 9.7 09/07/2021   HGB 10.5 (L) 09/07/2021   HCT 32.2 (L) 09/07/2021   MCV 85.4 09/07/2021   PLT 259 09/07/2021      Latest Ref Rng & Units 03/17/2021    5:44 AM  CMP  Glucose 70 - 99 mg/dL 95   BUN 6 - 20 mg/dL 14   Creatinine 0.44 - 1.00 mg/dL 0.70   Sodium 135 - 145 mmol/L 134   Potassium 3.5 - 5.1 mmol/L 3.8   Chloride 98 - 111 mmol/L 104   CO2 22 - 32 mmol/L 19   Calcium 8.9 - 10.3 mg/dL 9.1    Edinburgh Score:    02/13/2018    9:15 AM  Edinburgh Postnatal Depression Scale Screening Tool  I have been able to laugh and see the funny side of things. 0  I have looked forward with enjoyment to things. 0  I have blamed myself unnecessarily when things went wrong. 0  I have been anxious or worried for no good reason. 0  I have felt scared or panicky for no good reason. 0  Things have been getting on top  of me. 0  I have been so unhappy that I have had difficulty sleeping. 0  I have felt sad or miserable. 0  I have been so unhappy that I have been crying. 0  The thought of harming myself has occurred to me. 0  Edinburgh Postnatal Depression Scale Total 0     After visit meds:  Allergies as of 09/08/2021   No Known Allergies      Medication List     STOP taking these medications    albuterol 108 (90 Base) MCG/ACT inhaler Commonly known as: VENTOLIN HFA   Bonjesta 20-20 MG Tbcr Generic drug: Doxylamine-Pyridoxine ER   cyclobenzaprine 10 MG tablet Commonly known as: FLEXERIL   ondansetron 4 MG disintegrating tablet Commonly known as: ZOFRAN-ODT   pantoprazole 20 MG tablet Commonly known as: Protonix   promethazine 25 MG tablet Commonly known as: PHENERGAN       TAKE these medications    acetaminophen 325 MG  tablet Commonly known as: Tylenol Take 2 tablets (650 mg total) by mouth every 6 (six) hours as needed (for pain scale < 4). What changed:  when to take this reasons to take this   ibuprofen 600 MG tablet Commonly known as: ADVIL Take 1 tablet (600 mg total) by mouth every 6 (six) hours.   PRENATAL VITAMIN PO Take 2 tablets by mouth daily.   tranexamic acid 650 MG Tabs tablet Commonly known as: LYSTEDA Take 3 tablets (1,950 mg total) by mouth 3 (three) times daily.   verapamil 120 MG CR tablet Commonly known as: CALAN-SR Take 1 tablet (120 mg total) by mouth at bedtime.         Discharge home in stable condition Infant Feeding: Breast Infant Disposition:home with mother Discharge instruction: per After Visit Summary and Postpartum booklet. Activity: Advance as tolerated. Pelvic rest for 6 weeks.  Diet: routine diet Future Appointments: Future Appointments  Date Time Provider [place]  09/10/2021  6:45 AM [facility] SCHED ROOM [facility] None  10/17/2021  2:10 PM Pieter Partridge, DO [facility] None  10/23/2021 10:30 AM Roma Schanz, CNM [facility]  11/16/2021 11:30 AM Cresenzo-Dishmon, Joaquim Lai, CNM [facility]   Follow up Visit:  This message has bee sent to [facility] by Mikki Santee, MD  Please schedule this patient for a In person postpartum visit in 4 weeks with the following provider: Any provider. Additional Postpartum F/U: string check at 4 -6 week visit Low risk pregnancy complicated by:  von willebrand disease Delivery mode:  Vaginal, Spontaneous  Anticipated Birth Control:  PP IUD placed   Chiagoziem Ndulue MD MPH OB Fellow, [place] MSN, [place] Provider, [facility] for [employer]

## 2021-09-08 MED ORDER — IBUPROFEN 600 MG PO TABS: 600.0000 mg | ORAL_TABLET | Freq: Four times a day (QID) | ORAL | 0 refills | Status: DC

## 2021-09-08 MED ORDER — TRANEXAMIC ACID 650 MG PO TABS: 1950.0000 mg | ORAL_TABLET | Freq: Three times a day (TID) | ORAL | 0 refills | Status: DC

## 2021-09-08 MED ORDER — ACETAMINOPHEN 325 MG PO TABS: 650.0000 mg | ORAL_TABLET | Freq: Four times a day (QID) | ORAL | 1 refills | Status: DC | PRN

## 2021-09-08 NOTE — Progress Notes (Signed)
POSTPARTUM PROGRESS NOTE  Post Partum Day 1  Subjective:  Nicole Freeman is a 26 y.o. [id number] s/p SVD at [redacted]w[redacted]d. She did receive TXA given her history of Von Willebrand's Disease. No acute events overnight.  Pt denies problems with ambulating, voiding or po intake.  She denies nausea or vomiting.  Pain is well controlled.  She has had flatus. She has had bowel movement.  Lochia Minimal.   Objective: Blood pressure 116/73, pulse 78, temperature 98.2 F (36.8 C), temperature source Oral, resp. rate 16, height 5\' 2"  (1.575 m), weight 78.5 kg, last menstrual period 11/28/2020, SpO2 100 %, unknown if currently breastfeeding.  Physical Exam:  General: alert, cooperative and no distress Chest: no respiratory distress Heart:regular rate, distal pulses intact Abdomen: soft, nontender,  Uterine Fundus: firm, appropriately tender DVT Evaluation: No calf swelling or tenderness Extremities: no edema Skin: warm, dry;   Recent Labs    09/07/21 0700  HGB 10.5*  HCT 32.2*    Assessment/Plan: Nicole Freeman is a 26 y.o. 34 s/p SVD at [redacted]w[redacted]d.  PPD#1 - Doing well Contraception: IUD in place Feeding: Breast  Dispo: Plan for discharge tomorrow.  TXA 2 g TID for 10 days given history of Von Willebrand's Disease   LOS: 1 day   [redacted]w[redacted]d, Medical Student 09/08/2021, 7:36 AM

## 2021-09-08 NOTE — Lactation Note (Signed)
This note was copied from a baby's chart. Lactation Consultation Note  Patient Name: Nicole Freeman UTMLY'Y Date: 09/08/2021 Reason for consult: Follow-up assessment;Term Age:27 hours   P3: Term baby at 40+3 weeks Feeding preference: Breast and pumping to feed her expressed breast milk  Arrived to find birth parent breast feeding "Nicole Freeman."  She appeared to be latched well; occasional clicking sound noted with feeding.  Birth parent denies pain with feeding; intermittent swallows heard.  Reviewed breast feeding basics and how to be sure baby is latched deeply.  "Michaele Offer" is now beginning to cluster feed.  Last LATCH score was an 8; voiding/stooling.  Encouraged feeding on demand and reminded birth parent that cluster feeding may continue today and tonight.  Family desires a discharge and is aware of our op phone number for any questions/concerns after discharge.  Support person present.     Maternal Data Has patient been taught Hand Expression?: Yes Does the patient have breastfeeding experience prior to this delivery?: Yes How long did the patient breastfeed?: Few weeks with her first child (16 years old) and 5 months with her second child (69 years old)  Feeding Mother's Current Feeding Choice: Breast Milk  LATCH Score Latch: Grasps breast easily, tongue down, lips flanged, rhythmical sucking.  Audible Swallowing: A few with stimulation  Type of Nipple: Everted at rest and after stimulation  Comfort (Breast/Nipple): Soft / non-tender  Hold (Positioning): No assistance needed to correctly position infant at breast.  LATCH Score: 9   Lactation Tools Discussed/Used    Interventions Interventions: Breast feeding basics reviewed;Assisted with latch;Skin to skin;Breast massage;Hand express;Breast compression;Position options;Support pillows;Adjust position;Education  Discharge Discharge Education: Engorgement and breast care Pump: Personal  Consult  Status Consult Status: Complete Date: 09/08/21 Follow-up type: Call as needed    [place] Selke R An Schnabel 09/08/2021, 10:26 AM

## 2021-09-10 ENCOUNTER — Inpatient Hospital Stay (HOSPITAL_COMMUNITY): Admission: AD | Admit: 2021-09-10 | Payer: 59 | Source: Home / Self Care | Admitting: Obstetrics & Gynecology

## 2021-09-10 ENCOUNTER — Inpatient Hospital Stay (HOSPITAL_COMMUNITY): Payer: 59

## 2021-09-10 LAB — VON WILLEBRAND ANTIGEN: Von Willebrand Antigen, Plasma: 219 % — ABNORMAL HIGH (ref 50–200)

## 2021-09-15 ENCOUNTER — Telehealth (HOSPITAL_COMMUNITY): Payer: Self-pay | Admitting: *Deleted

## 2021-09-15 NOTE — Telephone Encounter (Signed)
Mom reports feeling good. No concerns about herself at this time. Discussed tips for drying up milk such as ice packs to breast, no stimulation, and sports bra. EPDS=0 [facility] score=0) Mom reports baby is doing well. Feeding, peeing, and pooping without difficulty. Safe sleep reviewed. Mom reports no concerns about baby at present.  Duffy Rhody, RN 09-15-2021 at 10:16am

## 2021-09-17 ENCOUNTER — Encounter: Payer: Self-pay | Admitting: Advanced Practice Midwife

## 2021-09-19 ENCOUNTER — Encounter: Payer: Self-pay | Admitting: Women's Health

## 2021-09-19 ENCOUNTER — Ambulatory Visit (INDEPENDENT_AMBULATORY_CARE_PROVIDER_SITE_OTHER): Payer: 59 | Admitting: Women's Health

## 2021-09-19 VITALS — BP 123/78 | HR 79 | Ht 62.0 in | Wt 157.2 lb

## 2021-09-19 DIAGNOSIS — Z30431 Encounter for routine checking of intrauterine contraceptive device: Secondary | ICD-10-CM

## 2021-09-19 NOTE — Patient Instructions (Signed)
NO SEX UNTIL AFTER YOUR NEXT VISIT

## 2021-09-19 NOTE — Progress Notes (Signed)
   GYN VISIT Patient name: Nicole Freeman MRN [id number]  Date of birth: [birth year] Chief Complaint:   Follow-up (Thinks iud may be coming out. )  History of Present Illness:   Nicole Freeman is a 26 y.o. G66P3003 Caucasian female 12d s/p SVB and postplacental IUD insertion, being seen today to check strings.  Started feeling some internal poking last week, then over the weekend noticed strings hanging out of vagina. Has not had sex.  No LMP recorded.     06/01/2021    9:41 AM 02/23/2021   11:26 AM 07/21/2020    1:11 PM 05/12/2018    2:55 PM  Depression screen PHQ 2/9  Decreased Interest 0 0 0 0  Down, Depressed, Hopeless 0 0 0 0  PHQ - 2 Score 0 0 0 0  Altered sleeping 0 0  0  Tired, decreased energy 0 0  0  Change in appetite 0 0  0  Feeling bad or failure about yourself  0 0  0  Trouble concentrating 0 0  0  Moving slowly or fidgety/restless 0 0  0  Suicidal thoughts 0 0  0  PHQ-9 Score 0 0  0        06/01/2021    9:41 AM 02/23/2021   11:26 AM  GAD 7 : Generalized Anxiety Score  Nervous, Anxious, on Edge 0 0  Control/stop worrying 0 0  Worry too much - different things 0 0  Trouble relaxing 0 0  Restless 0 0  Easily annoyed or irritable 0 1  Afraid - awful might happen 0 0  Total GAD 7 Score 0 1     Review of Systems:   Pertinent items are noted in HPI Denies fever/chills, dizziness, headaches, visual disturbances, fatigue, shortness of breath, chest pain, abdominal pain, vomiting, abnormal vaginal discharge/itching/odor/irritation, problems with periods, bowel movements, urination, or intercourse unless otherwise stated above.  Pertinent History Reviewed:  Reviewed past medical,surgical, social, obstetrical and family history.  Reviewed problem list, medications and allergies. Physical Assessment:   Vitals:   09/19/21 0838  BP: 123/78  Pulse: 79  Weight: 157 lb 3.2 oz (71.3 kg)  Height: 5\' 2"  (1.575 m)  Body mass index is 28.75  kg/m.       Physical Examination:   General appearance: alert, well appearing, and in no distress  Mental status: alert, oriented to person, place, and time  Skin: warm & dry   Cardiovascular: normal heart rate noted  Respiratory: normal respiratory effort, no distress  Abdomen: soft, non-tender   Pelvic: 1 IUD string hanging out app 3cm from vagina, tucked back in, spec exam reveals no part of actual IUD visible at os, strings coming from os, but very long- trimmed to ~3cm  Extremities: no edema   Chaperone:    No results found for this or any previous visit (from the past 24 hour(s)).  Assessment & Plan:  1) 12d s/p SVB and postplacental Mirena IUD placement w/ strings out of vagina> strings trimmed, no sex until after pp visit  Meds: No orders of the defined types were placed in this encounter.   No orders of the defined types were placed in this encounter.   Return for As scheduled.  Faith Rogue CNM, [facility] 09/19/2021 9:08 AM

## 2021-10-16 NOTE — Progress Notes (Unsigned)
NEUROLOGY FOLLOW UP OFFICE NOTE  Nicole Freeman [id number]  Assessment/Plan:   1.  Migraine without aura, without status migrainosus, not intractable 2. Pseudopapilledema - clinically stable. 3. Pregnant at 6 months 4.  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  Her Chiari is borderline.  There is no abnormal morphology of the cerebellum, brainstem and upper cervical spinal cord.   Migraine prevention:  Verapamil CR 120mg  daily.  As she has started having increase in migraine frequency, plan to start Aimovig 140mg  every 28 days Zofran ODT 4mg  for acute migraine treatment Keep headache diary Follow up with ophthalmology as scheduled Follow up with me in 4 months.     Subjective:  Nicole Freeman is a 26 year old right-handed female with von Willebrand disease who follows up for headaches.   UPDATE: Patient had her baby girl on 09/07/2021 via SVD.  She is no longer breastfeeding.   She had 2 or 3 migraines during pregnancy.  Since she had her baby, she has had increased migraines.  She now has a moderate migraine every 3-4 days.  If she takes generic Dramamine or Zofran and headache aborts quickly.       HISTORY:  She started having migraines in 2019 when she became pregnant with her second child.  She had gained about 60 lbs.  After her second child, she had an IUD implanted.  They occurred every other week and then gradually increased in frequency.  Around April or May 2021, headaches became daily.  She endorses severe bitemporal/top of head pounding pain associated with nausea, photophobia, phonophobia, double vision/blurred vision.  They would last until she would lay down in a cool dark room.  Se was taking tylenol or naproxen almost daily.  She also endorsed pulsatile tinnitus.  On a couple of occasions, she noted transient darkening of vision.  She went to the optometrist in May due to the blurred vision and was told  she had bilateral optic disc edema.  She was referred to a neurologist in [place].  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  She was started topiramate.  Headaches had almost resolved.  She also lost 10 lbs.  She was referred to ophthalmology, Dr. June this month who diagnosed pseudopapilledema noting elevated RNFL with hyperemic nerves but no disc edema or HVF defects.    Most recent eye exam:  March 2023.  Hyperemic nerves (grade 1 edema right, very minimal edema) but no disc edema, VFD.  Stable RNFL  Past medications:  topiramate (effective), magnesium oxide 400mg  (made headaches worse), riboflavin 400mg  (made headaches worse)  PAST MEDICAL HISTORY: Past Medical History:  Diagnosis Date   Chiari malformation type I (HCC)    Headache    Von Willebrand disease (HCC)     MEDICATIONS: Current Outpatient Medications on File Prior to Visit  Medication Sig Dispense Refill   acetaminophen (TYLENOL) 325 MG tablet Take 2 tablets (650 mg total) by mouth every 6 (six) hours as needed (for pain scale < 4). (Patient not taking: Reported on 09/19/2021) 60 tablet 1   ibuprofen (ADVIL) 600 MG tablet Take 1 tablet (600 mg total) by mouth every 6 (six) hours. (Patient not taking: Reported on 09/19/2021) 30 tablet 0   Prenatal Vit-Fe Fumarate-FA (PRENATAL VITAMIN PO) Take 2 tablets by mouth daily. (Patient not taking: Reported on 09/19/2021)  tranexamic acid (LYSTEDA) 650 MG TABS tablet Take 3 tablets (1,950 mg total) by mouth 3 (three) times daily. (Patient not taking: Reported on 09/19/2021) 30 tablet 0   verapamil (CALAN-SR) 120 MG CR tablet Take 1 tablet (120 mg total) by mouth at bedtime. 30 tablet 5   No current facility-administered medications on file prior to visit.    ALLERGIES: No Known Allergies  FAMILY HISTORY: Family History  Problem Relation  Age of Onset   Diabetes Maternal Aunt    Breast cancer Maternal Aunt    Breast cancer Paternal Aunt    Lung cancer Paternal Aunt    Other Maternal Grandmother        malformed kidney      Objective:  Blood pressure 108/65, pulse 87, SpO2 98 %, not currently breastfeeding. General: No acute distress.  Patient appears well-groomed.   Head:  Normocephalic/atraumatic Eyes:  Fundi examined but not visualized Neck: supple, no paraspinal tenderness, full range of motion Heart:  Regular rate and rhythm Lungs:  Clear to auscultation bilaterally Back: No paraspinal tenderness Neurological Exam: alert and oriented to person, place, and time.  Speech fluent and not dysarthric, language intact.  CN II-XII intact. Bulk and tone normal, muscle strength 5/5 throughout.  Sensation to light touch intact.  Deep tendon reflexes 2+ throughout.  Finger to nose testing intact.  Gait normal, Romberg negative.   Shon Millet, DO

## 2021-10-17 ENCOUNTER — Ambulatory Visit (INDEPENDENT_AMBULATORY_CARE_PROVIDER_SITE_OTHER): Payer: 59 | Admitting: Neurology

## 2021-10-17 ENCOUNTER — Encounter: Payer: Self-pay | Admitting: Neurology

## 2021-10-17 VITALS — BP 108/65 | HR 87

## 2021-10-17 DIAGNOSIS — H47333 Pseudopapilledema of optic disc, bilateral: Secondary | ICD-10-CM

## 2021-10-17 DIAGNOSIS — G43009 Migraine without aura, not intractable, without status migrainosus: Secondary | ICD-10-CM | POA: Diagnosis not present

## 2021-10-17 DIAGNOSIS — G935 Compression of brain: Secondary | ICD-10-CM

## 2021-10-17 MED ORDER — ONDANSETRON 4 MG PO TBDP
4.0000 mg | ORAL_TABLET | Freq: Three times a day (TID) | ORAL | 5 refills | Status: DC | PRN
Start: 2021-10-17 — End: 2023-02-24

## 2021-10-17 MED ORDER — AIMOVIG 140 MG/ML ~~LOC~~ SOAJ: 140.0000 mg | SUBCUTANEOUS | 11 refills | Status: DC

## 2021-10-17 NOTE — Patient Instructions (Signed)
Aimivi Zofran ODT 4mg 

## 2021-10-19 ENCOUNTER — Telehealth: Payer: Self-pay | Admitting: Pharmacy Technician

## 2021-10-19 NOTE — Telephone Encounter (Signed)
Patient Advocate Encounter  Prior Authorization for Aimovig 140MG /ML auto-injectors has been approved.    PA# Effective dates: 10/19/2021 through 01/18/2022      01/20/2022, CPhT Pharmacy Patient Advocate Specialist [facility] Patient Advocate Team Direct Number: [phone]  Fax: [phone]

## 2021-10-19 NOTE — Telephone Encounter (Signed)
Patient Advocate Encounter   Received notification that prior authorization for Aimovig 140MG /ML auto-injectors is required.   PA submitted on 10/19/2021 Key 10/21/2021 Status is pending       [id number], CPhT Pharmacy Patient Advocate Specialist [facility] Patient Advocate Team Direct Number: [phone]  Fax: [phone]

## 2021-10-23 ENCOUNTER — Encounter: Payer: Self-pay | Admitting: Women's Health

## 2021-10-23 ENCOUNTER — Ambulatory Visit (INDEPENDENT_AMBULATORY_CARE_PROVIDER_SITE_OTHER): Payer: 59 | Admitting: Women's Health

## 2021-10-23 DIAGNOSIS — Z30430 Encounter for insertion of intrauterine contraceptive device: Secondary | ICD-10-CM | POA: Diagnosis not present

## 2021-10-23 NOTE — Progress Notes (Signed)
POSTPARTUM VISIT Patient name: Nicole Freeman MRN [id number]  Date of birth: 1995/10/15 Chief Complaint:   Postpartum Care  History of Present Illness:   Nicole Freeman is a 26 y.o. G38P3003 Caucasian female being seen today for a postpartum visit. She is 6 weeks postpartum following a spontaneous vaginal delivery at 40.3 gestational weeks. IOL: no, for n/a. Anesthesia: epidural.  Laceration: none.  Complications: none. Inpatient contraception: yes pp Mirena IUD .   Pregnancy complicated by Von Willebrand's disease . Tobacco use: former . Substance use disorder: no. Last pap smear: 06/13/20 and results were NILM w/ HRHPV negative. Next pap smear due: 2025 No LMP recorded. (Menstrual status: IUD).  Postpartum course has been uncomplicated. Bleeding spotting, thinks she had a period recently. Bowel function is normal. Bladder function is normal. Urinary incontinence? no, fecal incontinence? no Patient is not sexually active. Last sexual activity: prior to birth of baby. Desired contraception: IUD. Patient does not want a pregnancy in the future.  Desired family size is 3 children.   Upstream - 10/23/21 1045       Pregnancy Intention Screening   Does the patient want to become pregnant in the next year? No    Does the patient's partner want to become pregnant in the next year? No    Would the patient like to discuss contraceptive options today? No      Contraception Wrap Up   Current Method IUD or IUS    End Method IUD or IUS    Contraception Counseling Provided No            The pregnancy intention screening data noted above was reviewed. Potential methods of contraception were discussed. The patient elected to proceed with IUD or IUS.  Edinburgh Postpartum Depression Screening: negative  Edinburgh Postnatal Depression Scale - 10/23/21 1045       Edinburgh Postnatal Depression Scale:  In the Past 7 Days   I have been able to laugh and see the funny side  of things. 0    I have looked forward with enjoyment to things. 0    I have blamed myself unnecessarily when things went wrong. 0    I have been anxious or worried for no good reason. 0    I have felt scared or panicky for no good reason. 0    Things have been getting on top of me. 0    I have been so unhappy that I have had difficulty sleeping. 0    I have felt sad or miserable. 0    I have been so unhappy that I have been crying. 0    The thought of harming myself has occurred to me. 0    Edinburgh Postnatal Depression Scale Total 0                06/01/2021    9:41 AM 02/23/2021   11:26 AM  GAD 7 : Generalized Anxiety Score  Nervous, Anxious, on Edge 0 0  Control/stop worrying 0 0  Worry too much - different things 0 0  Trouble relaxing 0 0  Restless 0 0  Easily annoyed or irritable 0 1  Afraid - awful might happen 0 0  Total GAD 7 Score 0 1     Baby's course has been uncomplicated. Baby is feeding by bottle. Infant has a pediatrician/family doctor? Yes.  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  Pt has material needs met for her and baby: Yes.  Review of Systems:   Pertinent items are noted in HPI Denies Abnormal vaginal discharge w/ itching/odor/irritation, headaches, visual changes, shortness of breath, chest pain, abdominal pain, severe nausea/vomiting, or problems with urination or bowel movements. Pertinent History Reviewed:  Reviewed past medical,surgical, obstetrical and family history.  Reviewed problem list, medications and allergies. OB History  Gravida Para Term Preterm AB Living  _0 SAB IAB Ectopic Multiple Live Births        0 3    # Outcome Date GA Lbr Len/2nd Weight Sex Delivery Anes PTL Lv  3 Term 09/07/21 28w3d04:11 / 00:12 7 lb 11.5 oz (3.5 kg) F Vag-Spont EPI  LIV  2 Term 01/08/18 371w5d6:36 / 00:55 8 lb 3.4 oz (3.725 kg) M Vag-Spont EPI N LIV  1 Term 11/26/11 3750w2d lb 5 oz (2.41 kg) M Vag-Spont EPI N  LIV   Physical Assessment:   Vitals:   10/23/21 1043  BP: 114/75  Pulse: 79  Weight: 159 lb 3.2 oz (72.2 kg)  Height: _1  (1.575 m)  Body mass index is 29.12 kg/m.       Physical Examination:   General appearance: alert, well appearing, and in no distress  Mental status: alert, oriented to person, place, and time  Skin: warm & dry   Cardiovascular: normal heart rate noted   Respiratory: normal respiratory effort, no distress   Breasts: deferred, no complaints   Abdomen: soft, non-tender   Pelvic: IUD strings trimmed, correct placement confirmed via informal TA u/s. Thin prep pap obtained: No  Rectal: not examined  Extremities: Edema: none   Chaperone: AmaCelene Squibb      No results found for this or any previous visit (from the past 24 hour(s)).  Assessment & Plan:  1) Postpartum exam 2) 6 wks s/p spontaneous vaginal delivery 3) bottle feeding 4) Depression screening 5) Contraception s/p pp Mirena IUD placement> strings trimmed and placement confirmed today  Essential components of care per ACOG recommendations:  1.  Mood and well being:  If positive depression screen, discussed and plan developed.  If using tobacco we discussed reduction/cessation and risk of relapse If current substance abuse, we discussed and referral to local resources was offered.   2. Infant care and feeding:  If breastfeeding, discussed returning to work, pumping, breastfeeding-associated pain, guidance regarding return to fertility while lactating if not using another method. If needed, patient was provided with a letter to be allowed to pump q 2-3hrs to support lactation in a private location with access to a refrigerator to store breastmilk.   Recommended that all caregivers be immunized for flu, pertussis and other preventable communicable diseases If pt does not have material needs met for her/baby, referred to local resources for help obtaining these.  3. Sexuality, contraception and  birth spacing Provided guidance regarding sexuality, management of dyspareunia, and resumption of intercourse Discussed avoiding interpregnancy interval <6mt26mand recommended birth spacing of 18 months  4. Sleep and fatigue Discussed coping options for fatigue and sleep disruption Encouraged family/partner/community support of 4 hrs of uninterrupted sleep to help with mood and fatigue  5. Physical recovery  If pt had a C/S, assessed incisional pain and providing guidance on normal vs prolonged recovery If pt had a laceration, perineal healing and pain reviewed.  If urinary or fecal incontinence, discussed management and referred to PT or uro/gyn if indicated  Patient is safe to resume physical activity. Discussed  attainment of healthy weight.  6.  Chronic disease management Discussed pregnancy complications if any, and their implications for future childbearing and long-term maternal health. Review recommendations for prevention of recurrent pregnancy complications, such as 17 hydroxyprogesterone caproate to reduce risk for recurrent PTB not applicable, or aspirin to reduce risk of preeclampsia not applicable. Pt had GDM: no. If yes, 2hr GTT scheduled: not applicable. Reviewed medications and non-pregnant dosing including consideration of whether pt is breastfeeding using a reliable resource such as LactMed: not applicable Referred for f/u w/ PCP or subspecialist providers as indicated: not applicable  7. Health maintenance Mammogram at 27yo or earlier if indicated Pap smears as indicated  Meds: No orders of the defined types were placed in this encounter.   Follow-up: Return in about 1 year (around 10/24/2022) for Physical.   No orders of the defined types were placed in this encounter.   [place], [facility] 10/23/2021 11:12 AM

## 2021-11-09 ENCOUNTER — Other Ambulatory Visit: Payer: Self-pay | Admitting: Neurology

## 2021-11-16 ENCOUNTER — Ambulatory Visit (INDEPENDENT_AMBULATORY_CARE_PROVIDER_SITE_OTHER): Payer: 59 | Admitting: Advanced Practice Midwife

## 2021-11-16 ENCOUNTER — Encounter: Payer: Self-pay | Admitting: Advanced Practice Midwife

## 2021-11-16 VITALS — BP 119/68 | HR 78 | Ht 62.0 in | Wt 162.0 lb

## 2021-11-16 DIAGNOSIS — R300 Dysuria: Secondary | ICD-10-CM | POA: Diagnosis not present

## 2021-11-16 DIAGNOSIS — Z30431 Encounter for routine checking of intrauterine contraceptive device: Secondary | ICD-10-CM

## 2021-11-16 LAB — POCT URINALYSIS DIPSTICK OB
Glucose, UA: NEGATIVE
Ketones, UA: NEGATIVE
Nitrite, UA: NEGATIVE
POC,PROTEIN,UA: NEGATIVE

## 2021-11-16 MED ORDER — NITROFURANTOIN MONOHYD MACRO 100 MG PO CAPS: 100.0000 mg | ORAL_CAPSULE | Freq: Two times a day (BID) | ORAL | 0 refills | Status: DC

## 2021-11-16 MED ORDER — PHENAZOPYRIDINE HCL 200 MG PO TABS: 200.0000 mg | ORAL_TABLET | Freq: Three times a day (TID) | ORAL | 0 refills | Status: DC | PRN

## 2021-11-16 NOTE — Progress Notes (Signed)
History:  26 y.o. W7P7106 here today for today for IUD string check; mirena IUD was placed  immediately postpartum on 09/07/21.  Strings trimmed 9/18.  Feels fine. No complaints about the IUD, no concerning side effects. Has been having some dysuria at end of void intermittently.  Has long hx of chronic UTIs, was on 6 months of ppx abx.    The following portions of the patient's history were reviewed and updated as appropriate: allergies, current medications, past family history, past medical history, past social history, past surgical history and problem list.  Review of Systems:   Constitutional: Negative for fever and chills Eyes: Negative for visual disturbances Respiratory: Negative for shortness of breath, dyspnea Cardiovascular: Negative for chest pain or palpitations  Gastrointestinal: Negative for vomiting, diarrhea and constipation Genitourinary: POSITIVE for dysuria and urgency Musculoskeletal: Negative for back pain, joint pain, myalgias  Neurological: Negative for dizziness and headaches    Objective:  Physical Exam Blood pressure 119/68, pulse 78, height 5\' 2"  (1.575 m), weight 162 lb (73.5 kg), not currently breastfeeding. Gen: NAD Abd: Soft, nontender and nondistended Urine shows small blood and trace leuks. Offered treatment vs wait on cx. D/t hx of complicated UTIs, elects for treatment  Assessment & Plan:  Normal IUD check. Patient to keep IUD in place for 8 years; can come in for removal if she desires pregnancy.   Dysuria:   Orders Placed This Encounter  Procedures   Urine Culture   POC Urinalysis Dipstick OB   Meds ordered this encounter  Medications   phenazopyridine (PYRIDIUM) 200 MG tablet    Sig: Take 1 tablet (200 mg total) by mouth 3 (three) times daily as needed for pain.    Dispense:  10 tablet    Refill:  0    Order Specific Question:   Supervising Provider    Answer:   Tania Ade H [[id number]]   nitrofurantoin, macrocrystal-monohydrate, (MACROBID)  100 MG capsule    Sig: Take 1 capsule (100 mg total) by mouth 2 (two) times daily.    Dispense:  14 capsule    Refill:  0    Order Specific Question:   Supervising Provider    Answer:   Tania Ade H [[id number]]

## 2021-11-20 ENCOUNTER — Encounter: Payer: Self-pay | Admitting: Advanced Practice Midwife

## 2021-11-20 LAB — URINE CULTURE

## 2021-12-07 ENCOUNTER — Other Ambulatory Visit: Payer: Self-pay | Admitting: Neurology

## 2022-01-22 ENCOUNTER — Telehealth: Payer: Self-pay

## 2022-01-22 NOTE — Telephone Encounter (Signed)
Patient Advocate Encounter  Prior Authorization for Aimovig 140MG /ML auto-injectors has been approved through .    Effective: 01-18-2022 to 01-19-2023

## 2022-03-09 ENCOUNTER — Other Ambulatory Visit: Payer: Self-pay | Admitting: Neurology

## 2022-03-09 NOTE — Telephone Encounter (Signed)
Has follow up scheduled, no refills until seen

## 2022-03-12 NOTE — Progress Notes (Unsigned)
NEUROLOGY FOLLOW UP OFFICE NOTE  Nicole Freeman [id number]  Assessment/Plan:   1.  Migraine without aura, without status migrainosus, not intractable 2. Pseudopapilledema - clinically stable. 3. Pregnant at 6 months 4.  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  Her Chiari is borderline.  There is no abnormal morphology of the cerebellum, brainstem and upper cervical spinal cord.   Migraine prevention:  Verapamil CR 120mg  daily; Aimovig 140mg  every 28 days *** Zofran ODT 4mg  for acute migraine treatment *** Keep headache diary Follow up ***     Subjective:  Nicole Freeman is a 27 year old right-handed female with von Willebrand disease who follows up for headaches.   UPDATE: Started Aimovig in addition to verapamil.   ***  Ophthalmology ***  Current antihypertensive:  verapamil CR 120mg  daily Current CGRP inhibitor:  Aimovig ***     HISTORY:  She started having migraines in 2019 when she became pregnant with her second child.  She had gained about 60 lbs.  After her second child, she had an IUD implanted.  They occurred every other week and then gradually increased in frequency.  Around April or May 2021, headaches became daily.  She endorses severe bitemporal/top of head pounding pain associated with nausea, photophobia, phonophobia, double vision/blurred vision.  They would last until she would lay down in a cool dark room.  Se was taking tylenol or naproxen almost daily.  She also endorsed pulsatile tinnitus.  On a couple of occasions, she noted transient darkening of vision.  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  She was referred to a neurologist in [place].  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  She was  started topiramate.  Headaches had almost resolved.  She also lost 10 lbs.  She was referred to ophthalmology, Dr. Marshall Cork this month who diagnosed pseudopapilledema noting elevated RNFL with hyperemic nerves but no disc edema or HVF defects.    Most recent eye exam:  March 2023.  Hyperemic nerves (grade 1 edema right, very minimal edema) but no disc edema, VFD.  Stable RNFL  Past medications:  topiramate (effective), magnesium oxide 400mg  (made headaches worse), riboflavin 400mg  (made headaches worse)  PAST MEDICAL HISTORY: Past Medical History:  Diagnosis Date   Chiari malformation type I ([place])    Headache    Von Willebrand disease ([place])     MEDICATIONS: Current Outpatient Medications on File Prior to Visit  Medication Sig Dispense Refill   acetaminophen (TYLENOL) 325 MG tablet Take 2 tablets (650 mg total) by mouth every 6 (six) hours as needed (for pain scale < 4). (Patient not taking: Reported on 09/19/2021) 60 tablet 1   Erenumab-aooe (AIMOVIG) 140 MG/ML SOAJ Inject ONE pen (140mg  total) into THE SKIN every 28 DAYS 1 mL 0   ibuprofen (ADVIL) 600 MG tablet Take 1 tablet (600 mg total) by mouth every 6 (six) hours. (Patient not taking: Reported on 09/19/2021) 30 tablet 0   nitrofurantoin, macrocrystal-monohydrate, (MACROBID) 100 MG capsule Take 1 capsule (100 mg total) by mouth 2 (two) times daily. 14 capsule 0   ondansetron (ZOFRAN-ODT) 4 MG disintegrating tablet Take 1 tablet (4 mg total) by mouth every 8 (eight) hours as needed for nausea or vomiting. (Patient not taking: Reported on 10/23/2021)  30 tablet 5   phenazopyridine (PYRIDIUM) 200 MG tablet Take 1 tablet (200 mg total) by mouth 3 (three) times daily as needed for pain. 10 tablet 0   Prenatal Vit-Fe Fumarate-FA (PRENATAL VITAMIN PO) Take 2 tablets by mouth daily. (Patient not taking: Reported on 09/19/2021)     tranexamic acid (LYSTEDA) 650 MG TABS tablet Take 3 tablets (1,950 mg total) by mouth 3 (three) times daily.  (Patient not taking: Reported on 09/19/2021) 30 tablet 0   verapamil (CALAN-SR) 120 MG CR tablet Take 1 tablet (120 mg total) by mouth at bedtime. (Patient not taking: Reported on 11/16/2021) 30 tablet 5   No current facility-administered medications on file prior to visit.    ALLERGIES: No Known Allergies  FAMILY HISTORY: Family History  Problem Relation Age of Onset   Diabetes Maternal Aunt    Breast cancer Maternal Aunt    Breast cancer Paternal Aunt    Lung cancer Paternal Aunt    Other Maternal Grandmother        malformed kidney      Objective:  *** General: No acute distress.  Patient appears well-groomed.   Head:  Normocephalic/atraumatic Eyes:  Fundi examined but not visualized Neck: supple, no paraspinal tenderness, full range of motion Heart:  Regular rate and rhythm Neurological Exam: ***   Metta Clines, DO

## 2022-03-13 ENCOUNTER — Ambulatory Visit (INDEPENDENT_AMBULATORY_CARE_PROVIDER_SITE_OTHER): Payer: 59 | Admitting: Neurology

## 2022-03-13 ENCOUNTER — Encounter: Payer: Self-pay | Admitting: Neurology

## 2022-03-13 VITALS — BP 110/70 | HR 64 | Resp 18 | Ht 62.0 in | Wt 162.0 lb

## 2022-03-13 DIAGNOSIS — G935 Compression of brain: Secondary | ICD-10-CM | POA: Diagnosis not present

## 2022-03-13 DIAGNOSIS — G43009 Migraine without aura, not intractable, without status migrainosus: Secondary | ICD-10-CM | POA: Diagnosis not present

## 2022-03-13 DIAGNOSIS — H47333 Pseudopapilledema of optic disc, bilateral: Secondary | ICD-10-CM

## 2022-03-13 MED ORDER — AIMOVIG 140 MG/ML ~~LOC~~ SOAJ: SUBCUTANEOUS | 11 refills | Status: DC

## 2022-03-14 ENCOUNTER — Encounter: Payer: Self-pay | Admitting: Neurology

## 2022-03-14 NOTE — Patient Instructions (Signed)
Continue Aimovig  

## 2022-03-20 ENCOUNTER — Ambulatory Visit
Admission: RE | Admit: 2022-03-20 | Discharge: 2022-03-20 | Disposition: A | Discharge status: Home / Self Care | Payer: 59 | Source: Ambulatory Visit | Attending: Nurse Practitioner | Admitting: Nurse Practitioner

## 2022-03-20 VITALS — BP 123/71 | HR 90 | Temp 98.9°F | Resp 18

## 2022-03-20 DIAGNOSIS — J019 Acute sinusitis, unspecified: Secondary | ICD-10-CM

## 2022-03-20 DIAGNOSIS — B9789 Other viral agents as the cause of diseases classified elsewhere: Secondary | ICD-10-CM | POA: Diagnosis not present

## 2022-03-20 MED ORDER — FLUTICASONE PROPIONATE 50 MCG/ACT NA SUSP: 2.0000 | Freq: Every day | NASAL | 0 refills | Status: DC

## 2022-03-20 MED ORDER — PSEUDOEPH-BROMPHEN-DM 30-2-10 MG/5ML PO SYRP: 5.0000 mL | ORAL_SOLUTION | Freq: Four times a day (QID) | ORAL | 0 refills | Status: DC | PRN

## 2022-03-20 MED ORDER — AMOXICILLIN-POT CLAVULANATE 875-125 MG PO TABS: 1.0000 | ORAL_TABLET | Freq: Two times a day (BID) | ORAL | 0 refills | Status: DC

## 2022-03-20 MED ORDER — CETIRIZINE HCL 10 MG PO TABS: 10.0000 mg | ORAL_TABLET | Freq: Every day | ORAL | 0 refills | Status: DC

## 2022-03-20 NOTE — ED Provider Notes (Signed)
[facility]    CSN: [id number] Arrival date & time: 03/20/22  0856      History   Chief Complaint Chief Complaint  Patient presents with   Sore Throat    It feels like I have a sinus infection. I have a headache, sore throat, congested nose, coughing up green snot, and drainage. - Entered by patient    HPI Nicole Freeman is a 27 y.o. female.   The history is provided by the patient.   The patient presents with a 2-day history of headache, sore throat, nasal congestion, runny nose, postnasal drainage, and cough.  Patient states when symptoms started, she had a fever around 102 that has since resolved.  She states she had noticed some clear drainage from her right ear long with itching.  She denies wheezing, shortness of breath, difficulty breathing, or GI symptoms.  Patient reports that she does have a history of sinusitis, and that she needs to see ENT.  She states that she normally takes Tylenol cold and sinus which gives her some relief, but it did not help this time.  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 Authorizing Provider  amoxicillin-clavulanate (AUGMENTIN) 875-125 MG tablet Take 1 tablet by mouth every 12 (twelve) hours. 03/24/22  Yes Raylynn Hersh-Warren, Alda Lea, NP   brompheniramine-pseudoephedrine-DM 30-2-10 MG/5ML syrup Take 5 mLs by mouth 4 (four) times daily as needed. 03/20/22  Yes Elayjah Chaney-Warren, Alda Lea, NP  cetirizine (ZYRTEC) 10 MG tablet Take 1 tablet (10 mg total) by mouth daily. 03/20/22  Yes Nettie Cromwell-Warren, Alda Lea, NP  fluticasone (FLONASE) 50 MCG/ACT nasal spray Place 2 sprays into both nostrils daily. 03/20/22  Yes Steffi Noviello-Warren, Alda Lea, NP  acetaminophen (TYLENOL) 325 MG tablet Take 2 tablets (650 mg total) by mouth every 6 (six) hours as needed (for pain scale < 4). Patient not taking: Reported on 09/19/2021 09/08/21   Gavin Pound, CNM  Erenumab-aooe (AIMOVIG) 140 MG/ML SOAJ Inject ONE pen (117m total) into THE SKIN every 28 DAYS 03/13/22   JPieter Partridge DO  ibuprofen (ADVIL) 600 MG tablet Take 1 tablet (600 mg total) by mouth every 6 (six) hours. Patient not taking: Reported on 09/19/2021 09/08/21   EGavin Pound CNM  nitrofurantoin, macrocrystal-monohydrate, (MACROBID) 100 MG capsule Take 1 capsule (100 mg total) by mouth 2 (two) times daily. Patient not taking: Reported on 03/13/2022 11/16/21   Cresenzo-Dishmon, FJoaquim Lai CNM  ondansetron (ZOFRAN-ODT) 4 MG disintegrating tablet Take  1 tablet (4 mg total) by mouth every 8 (eight) hours as needed for nausea or vomiting. 10/17/21   Pieter Partridge, DO  phenazopyridine (PYRIDIUM) 200 MG tablet Take 1 tablet (200 mg total) by mouth 3 (three) times daily as needed for pain. Patient not taking: Reported on 03/13/2022 11/16/21   Christin Fudge, CNM  Prenatal Vit-Fe Fumarate-FA (PRENATAL VITAMIN PO) Take 2 tablets by mouth daily. Patient not taking: Reported on 09/19/2021    [provider]  tranexamic acid (LYSTEDA) 650 MG TABS tablet Take 3 tablets (1,950 mg total) by mouth 3 (three) times daily. Patient not taking: Reported on 09/19/2021 09/08/21   Gavin Pound, CNM  verapamil (CALAN-SR) 120 MG CR tablet Take 1 tablet (120 mg total) by mouth at bedtime. Patient not taking: Reported on  11/16/2021 05/23/21   Pieter Partridge, DO    Family History Family History  Problem Relation Age of Onset   Diabetes Maternal Aunt    Breast cancer Maternal Aunt    Breast cancer Paternal Aunt    Lung cancer Paternal Aunt    Other Maternal Grandmother        malformed kidney    Social History Social History   Tobacco Use   Smoking status: Former    Packs/day: 0.50    Types: Cigarettes   Smokeless tobacco: Never   Tobacco comments:    quit April 2019  Vaping Use   Vaping Use: Never used  Substance Use Topics   Alcohol use: Not Currently    Comment: once in a blue moon   Drug use: No     Allergies   Patient has no known allergies.   Review of Systems Review of Systems Per HPI  Physical Exam Triage Vital Signs ED Triage Vitals  Enc Vitals Group     BP 03/20/22 0920 123/71     Pulse Rate 03/20/22 0920 90     Resp 03/20/22 0920 18     Temp 03/20/22 0920 98.9 F (37.2 C)     Temp Source 03/20/22 0920 Oral     SpO2 03/20/22 0920 98 %     Weight --      Height --      Head Circumference --      Peak Flow --      Pain Score 03/20/22 0922 5     Pain Loc --      Pain Edu? --      Excl. in [place]? --    No data found.  Updated Vital Signs BP 123/71 (BP Location: Right Arm)   Pulse 90   Temp 98.9 F (37.2 C) (Oral)   Resp 18   LMP 03/05/2022 (Approximate)   SpO2 98%   Visual Acuity Right Eye Distance:   Left Eye Distance:   Bilateral Distance:    Right Eye Near:   Left Eye Near:    Bilateral Near:     Physical Exam Vitals and nursing note reviewed.  Constitutional:      General: She is not in acute distress.    Appearance: She is well-developed.  HENT:     Head: Normocephalic.     Right Ear: Ear canal normal. A middle ear effusion is present.     Left Ear: Ear canal normal. A middle ear effusion is present.     Nose: Congestion present. No rhinorrhea.     Right Turbinates: Enlarged and swollen.     Left Turbinates: Enlarged and swollen.  Right Sinus: Maxillary sinus tenderness and frontal sinus tenderness present.     Left Sinus: Maxillary sinus tenderness and frontal sinus tenderness present.     Mouth/Throat:     Lips: Pink.     Pharynx: Uvula midline. Posterior oropharyngeal erythema present. No pharyngeal swelling, oropharyngeal exudate or uvula swelling.     Tonsils: No tonsillar exudate. 0 on the right. 0 on the left.  Eyes:     Conjunctiva/sclera: Conjunctivae normal.     Pupils: Pupils are equal, round, and reactive to light.  Cardiovascular:     Rate and Rhythm: Normal rate and regular rhythm.     Heart sounds: Normal heart sounds.  Pulmonary:     Effort: Pulmonary effort is normal. No respiratory distress.     Breath sounds: Normal breath sounds. No stridor. No wheezing, rhonchi or rales.  Abdominal:     General: Bowel sounds are normal.     Palpations: Abdomen is soft.     Tenderness: There is no abdominal tenderness.  Musculoskeletal:     Cervical back: Normal range of motion.  Lymphadenopathy:     Cervical: No cervical adenopathy.  Skin:    General: Skin is warm and dry.  Neurological:     General: No focal deficit present.     Mental Status: She is alert and oriented to person, place, and time.  Psychiatric:        Mood and Affect: Mood normal.        Behavior: Behavior normal.      UC Treatments / Results  Labs (all labs ordered are listed, but only abnormal results are displayed) Labs Reviewed - No data to display  EKG   Radiology No results found.  Procedures Procedures (including critical care time)  Medications Ordered in UC Medications - No data to display  Initial Impression / Assessment and Plan / UC Course  I have reviewed the triage vital signs and the nursing notes.  Pertinent labs & imaging results that were available during my care of the patient were reviewed by me and considered in my medical decision making (see chart for details).  The patient is well-appearing,  she is in no acute distress, vital signs are stable.  Suspect a viral sinusitis at this time.  Patient with fever of 102 previously that has since resolved.  She continues to have nasal congestion, sinus pressure, and postnasal drainage.  Will treat with Bromfed-DM for the cough, cetirizine 10 mg an antihistamine, and fluticasone 50 mcg nasal spray for nasal congestion.  Would like patient to approach with a watch and wait strategy to see if this medication helps her symptoms, if not, prescription for Augmentin 875/125 has been sent to her preferred pharmacy to begin on 03/24/2022.  Supportive care recommendations were provided to the patient.  Patient is in agreement with this plan of care and verbalizes understanding.  All questions were answered.  Patient stable for discharge. Final Clinical Impressions(s) / UC Diagnoses   Final diagnoses:  Acute viral sinusitis     Discharge Instructions      Take medication as directed. Increase fluids and get plenty of rest. May take over-the-counter ibuprofen or Tylenol as needed for pain, fever, or general discomfort. Recommend normal saline nasal spray to help with nasal congestion throughout the day. For your cough, it may be helpful to use a humidifier at bedtime during sleep. As discussed, if symptoms fail to improve, a prescription for Augmentin has been sent to your pharmacy to be  picked up on 03/24/22. If symptoms continue to persist after this full treatment regimen, please follow-up with your primary care physician for further evaluation. Follow-up as needed.     ED Prescriptions     Medication Sig Dispense Auth. Provider   cetirizine (ZYRTEC) 10 MG tablet Take 1 tablet (10 mg total) by mouth daily. 30 tablet Catrice Zuleta-Warren, Alda Lea, NP   brompheniramine-pseudoephedrine-DM 30-2-10 MG/5ML syrup Take 5 mLs by mouth 4 (four) times daily as needed. 140 mL Hasini Peachey-Warren, Alda Lea, NP   fluticasone (FLONASE) 50 MCG/ACT nasal spray Place 2  sprays into both nostrils daily. 16 g Cameren Odwyer-Warren, Alda Lea, NP   amoxicillin-clavulanate (AUGMENTIN) 875-125 MG tablet Take 1 tablet by mouth every 12 (twelve) hours. 14 tablet Dawnisha Marquina-Warren, Alda Lea, NP      PDMP not reviewed this encounter.   Tish Men, NP 03/20/22 1017

## 2022-03-20 NOTE — ED Triage Notes (Signed)
Sinus pressure, headache, stuffy nose, sore throat, since Saturday.  Started to cough today.  Fever on Sunday

## 2022-03-20 NOTE — Discharge Instructions (Addendum)
Take medication as directed. Increase fluids and get plenty of rest. May take over-the-counter ibuprofen or Tylenol as needed for pain, fever, or general discomfort. Recommend normal saline nasal spray to help with nasal congestion throughout the day. For your cough, it may be helpful to use a humidifier at bedtime during sleep. As discussed, if symptoms fail to improve, a prescription for Augmentin has been sent to your pharmacy to be picked up on 03/24/22. If symptoms continue to persist after this full treatment regimen, please follow-up with your primary care physician for further evaluation. Follow-up as needed.

## 2022-04-24 ENCOUNTER — Encounter: Payer: Self-pay | Admitting: Nurse Practitioner

## 2022-05-15 NOTE — Patient Instructions (Signed)
Hyperthyroidism Hyperthyroidism refers to a thyroid gland that is too active or overactive. The thyroid gland is a small gland located in the lower front part of the neck, just in front of the windpipe (trachea). This gland makes hormones that: Help control how the body uses food for energy (metabolism). Help the heart and brain work well. Keep your bones strong. When the thyroid is overactive, it produces too much of a hormone called thyroxine. What are the causes? This condition may be caused by: Graves' disease. This is a disorder in which the body's disease-fighting system (immune system) attacks the thyroid gland. This is the most common cause. Inflammation of the thyroid gland. A tumor in the thyroid gland. Use of certain medicines, including: Prescription thyroid hormone replacement. Herbal supplements that mimic thyroid hormones. Amiodarone therapy. Solid or fluid-filled lumps within your thyroid gland (thyroid nodules). Taking in a large amount of iodine from foods or medicines. What increases the risk? You are more likely to develop this condition if: You are female. You have a family history of thyroid conditions. You smoke tobacco. You use a medicine called lithium. You take medicines that affect the immune system (immunosuppressants). What are the signs or symptoms? Symptoms of this condition include: Nervousness. Inability to tolerate heat. Diarrhea. Rapid heart rate. Shaky hands. Restlessness. Sleep problems. Other symptoms may include: Heart skipping beats or making extra beats. Unexplained weight loss. Change in the texture of hair or skin. Loss of menstruation. Fatigue. Enlarged thyroid gland or a lump in the thyroid (nodule). You may also have symptoms of Graves' disease, which may include: Protruding eyes. Dry eyes. Red or swollen eyes. Problems with vision. How is this diagnosed? This condition may be diagnosed based on: Your symptoms and medical  history. A physical exam. Blood tests. Thyroid ultrasound. This test involves using sound waves to produce images of the thyroid gland. A thyroid scan. A radioactive substance is injected into a vein, and images show how much iodine is present in the thyroid. Radioactive iodine uptake test (RAIU). A small amount of radioactive iodine is given by mouth to see how much iodine the thyroid absorbs after a certain amount of time. How is this treated? Treatment depends on the cause and severity of the condition. Treatment may include: Medicines to reduce the amount of thyroid hormone your body makes. Medicines to help manage your symptoms. Radioactive iodine treatment (radioiodine therapy). This involves swallowing a small dose of radioactive iodine, in capsule or liquid form, to kill thyroid cells. Surgery to remove part or all of your thyroid gland. You may need to take thyroid hormone replacement medicine for the rest of your life after thyroid surgery. Follow these instructions at home:  Take over-the-counter and prescription medicines only as told by your health care provider. Do not use any products that contain nicotine or tobacco. These products include cigarettes, chewing tobacco, and vaping devices, such as e-cigarettes. If you need help quitting, ask your health care provider. Follow any instructions from your health care provider about diet. You may be instructed to limit foods that contain iodine. Keep all follow-up visits. You will need to have blood tests regularly so that your health care provider can monitor your condition. Where to find more information National Institute of Diabetes and Digestive and Kidney Diseases: niddk.nih.gov Contact a health care provider if: Your symptoms do not get better with treatment. You have a fever. You have abdominal pain. You feel dizzy. You are taking thyroid hormone replacement medicine and: You have   symptoms of depression. You feel like you  are tired all the time. You gain weight. Get help right away if: You have sudden, unexplained confusion or other mental changes. You have chest pain. You have fast or irregular heartbeats (palpitations). You have difficulty breathing. These symptoms may be an emergency. Get help right away. Call 911. Do not wait to see if the symptoms will go away. Do not drive yourself to the hospital. Summary The thyroid gland is a small gland located in the lower front part of the neck, just in front of the windpipe. Hyperthyroidism is when the thyroid gland is too active and produces too much of a hormone called thyroxine. The most common cause is Graves' disease, a disorder in which your immune system attacks the thyroid gland. Hyperthyroidism can cause various symptoms, such as unexplained weight loss, nervousness, inability to tolerate heat, or changes in your heartbeat. Treatment may include medicine to reduce the amount of thyroid hormone your body makes, radioiodine therapy, surgery, or medicines to manage symptoms. This information is not intended to replace advice given to you by your health care provider. Make sure you discuss any questions you have with your health care provider. Document Revised: 03/17/2021 Document Reviewed: 03/17/2021 [employer] Patient Education  2023 [employer]  

## 2022-05-16 ENCOUNTER — Encounter: Payer: Self-pay | Admitting: Nurse Practitioner

## 2022-05-16 ENCOUNTER — Ambulatory Visit (INDEPENDENT_AMBULATORY_CARE_PROVIDER_SITE_OTHER): Payer: 59 | Admitting: Nurse Practitioner

## 2022-05-16 VITALS — BP 110/76 | HR 84 | Ht 62.0 in | Wt 164.6 lb

## 2022-05-16 DIAGNOSIS — E0590 Thyrotoxicosis, unspecified without thyrotoxic crisis or storm: Secondary | ICD-10-CM | POA: Diagnosis not present

## 2022-05-16 DIAGNOSIS — R7989 Other specified abnormal findings of blood chemistry: Secondary | ICD-10-CM

## 2022-05-16 NOTE — Progress Notes (Signed)
05/16/2022     Endocrinology Consult Note    Subjective:    Patient ID: Nicole Freeman, female    DOB: [birth year], PCP Avis Epley, PA-C.   Past Medical History:  Diagnosis Date   Chiari malformation type I    Headache    Von Willebrand disease     Past Surgical History:  Procedure Laterality Date   WISDOM TOOTH EXTRACTION      Social History   Socioeconomic History   Marital status: Married    Spouse name: Not on file   Number of children: 2   Years of education: Not on file   Highest education level: Not on file  Occupational History   Not on file  Tobacco Use   Smoking status: Former    Packs/day: .5    Types: Cigarettes   Smokeless tobacco: Never   Tobacco comments:    quit April 2019  Vaping Use   Vaping Use: Never used  Substance and Sexual Activity   Alcohol use: Not Currently    Comment: once in a blue moon   Drug use: No   Sexual activity: Yes    Birth control/protection: None  Other Topics Concern   Not on file  Social History Narrative   Right Handed   Lives in a one story    Drinks Caffeine    Lives with husband   Social Determinants of Health   Financial Resource Strain: Low Risk  (06/01/2021)   Overall Financial Resource Strain (CARDIA)    Difficulty of Paying Living Expenses: Not hard at all  Food Insecurity: No Food Insecurity (06/01/2021)   Hunger Vital Sign    Worried About Running Out of Food in the Last Year: Never true    Ran Out of Food in the Last Year: Never true  Transportation Needs: No Transportation Needs (06/01/2021)   PRAPARE - Administrator, Civil Service (Medical): No    Lack of Transportation (Non-Medical): No  Physical Activity: Insufficiently Active (06/01/2021)   Exercise Vital Sign    Days of Exercise per Week: 4 days    Minutes of Exercise per Session: 30 min  Stress: No Stress Concern Present (06/01/2021)   [name] of Occupational Health - Occupational  Stress Questionnaire    Feeling of Stress : Not at all  Social Connections: Moderately Integrated (06/01/2021)   Social Connection and Isolation Panel [NHANES]    Frequency of Communication with Friends and Family: More than three times a week    Frequency of Social Gatherings with Friends and Family: Three times a week    Attends Religious Services: 1 to 4 times per year    Active Member of Clubs or Organizations: No    Attends Banker Meetings: Never    Marital Status: Married    Family History  Problem Relation Age of Onset   Diabetes Maternal Aunt    Breast cancer Maternal Aunt    Breast cancer Paternal Aunt    Lung cancer Paternal Aunt    Other Maternal Grandmother        malformed kidney    Outpatient Encounter Medications as of 05/16/2022  Medication Sig   levonorgestrel (MIRENA) 20 MCG/DAY IUD 1 each by Intrauterine route once.   acetaminophen (TYLENOL) 325 MG tablet Take 2 tablets (650 mg total) by mouth every 6 (six) hours as needed (for pain scale < 4). (Patient not taking: Reported on 09/19/2021)   amoxicillin-clavulanate (AUGMENTIN) 875-125 MG  tablet Take 1 tablet by mouth every 12 (twelve) hours. (Patient not taking: Reported on 05/16/2022)   brompheniramine-pseudoephedrine-DM 30-2-10 MG/5ML syrup Take 5 mLs by mouth 4 (four) times daily as needed. (Patient not taking: Reported on 05/16/2022)   cetirizine (ZYRTEC) 10 MG tablet Take 1 tablet (10 mg total) by mouth daily. (Patient not taking: Reported on 05/16/2022)   Erenumab-aooe (AIMOVIG) 140 MG/ML SOAJ Inject ONE pen (140mg  total) into THE SKIN every 28 DAYS (Patient not taking: Reported on 05/16/2022)   fluticasone (FLONASE) 50 MCG/ACT nasal spray Place 2 sprays into both nostrils daily. (Patient not taking: Reported on 05/16/2022)   ibuprofen (ADVIL) 600 MG tablet Take 1 tablet (600 mg total) by mouth every 6 (six) hours. (Patient not taking: Reported on 09/19/2021)   nitrofurantoin, macrocrystal-monohydrate,  (MACROBID) 100 MG capsule Take 1 capsule (100 mg total) by mouth 2 (two) times daily. (Patient not taking: Reported on 03/13/2022)   ondansetron (ZOFRAN-ODT) 4 MG disintegrating tablet Take 1 tablet (4 mg total) by mouth every 8 (eight) hours as needed for nausea or vomiting. (Patient not taking: Reported on 05/16/2022)   phenazopyridine (PYRIDIUM) 200 MG tablet Take 1 tablet (200 mg total) by mouth 3 (three) times daily as needed for pain. (Patient not taking: Reported on 03/13/2022)   Prenatal Vit-Fe Fumarate-FA (PRENATAL VITAMIN PO) Take 2 tablets by mouth daily. (Patient not taking: Reported on 09/19/2021)   tranexamic acid (LYSTEDA) 650 MG TABS tablet Take 3 tablets (1,950 mg total) by mouth 3 (three) times daily. (Patient not taking: Reported on 09/19/2021)   verapamil (CALAN-SR) 120 MG CR tablet Take 1 tablet (120 mg total) by mouth at bedtime. (Patient not taking: Reported on 11/16/2021)   No facility-administered encounter medications on file as of 05/16/2022.    ALLERGIES: No Known Allergies  VACCINATION STATUS: Immunization History  Administered Date(s) Administered   Tdap 06/01/2021     HPI  Nicole Freeman is 27 y.o. female who presents today with a medical history as above. she is being seen in consultation for hyperthyroidism requested by Avis Epley, PA-C.  she has been dealing with symptoms of palpitations, weight loss (without changing diet), anxiety, hot flashes, alternating constipation and diarrhea, tremors, heart burn, insomnia, inability to be full after eating, and mild intermittent dysphagia and sore throat for 4. These symptoms are progressively worsening and troubling to her.  her most recent thyroid labs revealed suppressed TSH of 0.007 on 04/05/22.  She notes these symptoms all started about 4 years ago following the birth of her second child.  The symptoms have come and gone but recently have been worse following the birth of her third child.  she  denies choking, shortness of breath, no recent voice change.    she denies known family history of thyroid dysfunction and denies family hx of thyroid cancer. she denies personal history of goiter. she is not on any anti-thyroid medications nor on any thyroid hormone supplements. Denies use of Biotin containing supplements.  she is willing to proceed with appropriate work up and therapy for thyrotoxicosis.  She has history of Von Willebrand disease, Chiari Malformation, and migraines.   Review of systems  Constitutional: + fluctuating body weight-recent weight loss without changing diet, current Body mass index is 30.11 kg/m., + fatigue, + subjective hyperthermia, no subjective hypothermia, + insomnia Eyes: no blurry vision, no xerophthalmia ENT: + sore throat (usually in am), no nodules palpated in throat, + intermittent dysphagia/odynophagia, no hoarseness Cardiovascular: no chest pain, no shortness of  breath, + palpitations, no leg swelling Respiratory: no cough, no shortness of breath Gastrointestinal: no nausea/vomiting, alternating constipation/diarrhea Skin: no rashes, no hyperemia Neurological: + tremors, no numbness, no tingling, no dizziness Psychiatric: no depression, + anxiety   Objective:    BP 110/76 (BP Location: Left Arm, Patient Position: Sitting, Cuff Size: Large)   Pulse 84   Ht 5\' 2"  (1.575 m)   Wt 164 lb 9.6 oz (74.7 kg)   BMI 30.11 kg/m   Wt Readings from Last 3 Encounters:  05/16/22 164 lb 9.6 oz (74.7 kg)  03/13/22 162 lb (73.5 kg)  11/16/21 162 lb (73.5 kg)     BP Readings from Last 3 Encounters:  05/16/22 110/76  03/20/22 123/71  03/13/22 110/70                          Physical Exam- Limited  Constitutional:  Body mass index is 30.11 kg/m. , not in acute distress, mildly anxious state of mind Eyes:  EOMI, no exophthalmos Neck: Supple Thyroid: mild goiter bilaterally Cardiovascular: RRR, no murmurs, rubs, or gallops, no edema Respiratory:  Adequate breathing efforts, no crackles, rales, rhonchi, or wheezing Musculoskeletal: no gross deformities, strength intact in all four extremities, no gross restriction of joint movements Skin:  no rashes, no hyperemia Neurological: no tremor with outstretched hands, DTR normal in BLE   CMP     Component Value Date/Time   NA 134 (L) 03/17/2021 0544   NA 135 (L) 01/06/2014 1059   K 3.8 03/17/2021 0544   K 4.3 01/06/2014 1059   CL 104 03/17/2021 0544   CL 102 01/06/2014 1059   CO2 19 (L) 03/17/2021 0544   CO2 28 01/06/2014 1059   GLUCOSE 95 03/17/2021 0544   GLUCOSE 126 (H) 01/06/2014 1059   BUN 14 03/17/2021 0544   BUN 14 01/06/2014 1059   CREATININE 0.70 03/17/2021 0544   CREATININE 0.88 01/06/2014 1059   CALCIUM 9.1 03/17/2021 0544   CALCIUM 9.8 01/06/2014 1059   PROT 8.1 05/21/2017 1341   PROT 8.2 01/06/2014 1059   ALBUMIN 4.7 05/21/2017 1341   ALBUMIN 4.4 01/06/2014 1059   AST 16 05/21/2017 1341   AST 21 01/06/2014 1059   ALT 14 05/21/2017 1341   ALT 20 01/06/2014 1059   ALKPHOS 43 05/21/2017 1341   ALKPHOS 70 01/06/2014 1059   BILITOT 0.6 05/21/2017 1341   BILITOT 0.8 01/06/2014 1059   GFRNONAA >60 03/17/2021 0544   GFRNONAA >60 01/06/2014 1059   GFRNONAA >60 03/02/2013 1347   GFRAA >60 05/21/2017 1341   GFRAA >60 01/06/2014 1059   GFRAA >60 03/02/2013 1347     CBC    Component Value Date/Time   WBC 9.7 09/07/2021 0700   RBC 3.77 (L) 09/07/2021 0700   HGB 10.5 (L) 09/07/2021 0700   HGB 10.7 (L) 06/01/2021 0806   HCT 32.2 (L) 09/07/2021 0700   HCT 32.1 (L) 06/01/2021 0806   PLT 259 09/07/2021 0700   PLT 263 06/01/2021 0806   MCV 85.4 09/07/2021 0700   MCV 94 06/01/2021 0806   MCV 97 01/06/2014 1059   MCH 27.9 09/07/2021 0700   MCHC 32.6 09/07/2021 0700   RDW 13.7 09/07/2021 0700   RDW 12.1 06/01/2021 0806   RDW 12.5 01/06/2014 1059   LYMPHSABS 1.2 02/23/2021 1345   LYMPHSABS 0.9 (L) 01/06/2014 1059   MONOABS 1.1 (H) 08/10/2016 2213   MONOABS 0.8  01/06/2014 1059   EOSABS 0.1 02/23/2021 1345  EOSABS 0.1 01/06/2014 1059   BASOSABS 0.0 02/23/2021 1345   BASOSABS 0.0 01/06/2014 1059     Diabetic Labs (most recent): No results found for: "HGBA1C", "MICROALBUR"  Lipid Panel  No results found for: "CHOL", "TRIG", "HDL", "CHOLHDL", "VLDL", "LDLCALC", "LDLDIRECT", "LABVLDL"   Lab Results  Component Value Date   TSH 3.270 02/13/2018   TSH 2.03 10/31/2012        Assessment & Plan:   1. Abnormal TSH 2. Hyperthyroidism  she is being seen at a kind request of Avis Epley, [place].  her history and most recent labs are reviewed, and she was examined clinically. Subjective and objective findings are consistent with thyrotoxicosis likely from primary hyperthyroidism. The potential risks of untreated thyrotoxicosis and the need for definitive therapy have been discussed in detail with her, and she agrees to proceed with diagnostic workup and treatment plan.   I will repeat full profile thyroid function tests today (including antibody testing to assess for autoimmune thyroid dysfunction).  If thyroid labs still suggestive of overactive thyroid, confirmatory thyroid uptake and scan will be scheduled to be done as soon as possible.  If labs return normal, will plan to perform ultrasound given mild goiter on exam.  Will call the patient when tests results are back to discuss next steps.   Options of therapy are discussed with her.  We discussed the option of treating it with medications including methimazole or PTU which may have side effects including rash, transaminitis, and bone marrow suppression.  We also discussed the option of definitive therapy with RAI ablation of the thyroid. If she is found to have primary hyperthyroidism from Graves' disease , toxic multinodular goiter or toxic nodular goiter the preferred modality of treatment would be I-131 thyroid ablation. Surgery is another choice of treatment in some cases, in her case  surgery is not a good fit for presentation with only mild goiter.  -Patient is made aware of the high likelihood of post ablative hypothyroidism with subsequent need for lifelong thyroid hormone replacement. sheunderstands this outcome and she is willing to proceed.          -Patient is advised to maintain close follow up with Avis Epley, PA-C for primary care needs.   - Time spent with the patient: 60 minutes, of which >50% was spent in obtaining information about her symptoms, reviewing her previous labs, evaluations, and treatments, counseling her about her hyperthyroidism , and developing a plan to confirm the diagnosis and long term treatment as necessary. Please refer to "Patient Self Inventory" in the Media tab for reviewed elements of pertinent patient history.  Aldean Suddeth Freeman participated in the discussions, expressed understanding, and voiced agreement with the above plans.  All questions were answered to her satisfaction. she is encouraged to contact clinic should she have any questions or concerns prior to her return visit.   Follow up plan: Return will call with lab results and plan moving forward, for Thyroid follow up.   Thank you for involving me in the care of this pleasant patient, and I will continue to update you with her progress.    Ronny Bacon, [facility] [address] [place], [place] [address] Phone: [phone] Fax: [phone]  05/16/2022, 3:06 PM

## 2022-05-17 LAB — T4, FREE: Free T4: 0.65 ng/dL — ABNORMAL LOW (ref 0.82–1.77)

## 2022-05-17 LAB — THYROGLOBULIN ANTIBODY: Thyroglobulin Antibody: 38.9 [IU]/mL — ABNORMAL HIGH (ref 0.0–0.9)

## 2022-05-17 LAB — T3, FREE: T3, Free: 2.3 pg/mL (ref 2.0–4.4)

## 2022-05-17 LAB — TSH: TSH: 14.3 u[IU]/mL — ABNORMAL HIGH (ref 0.450–4.500)

## 2022-05-17 LAB — THYROID PEROXIDASE ANTIBODY: Thyroperoxidase Ab SerPl-aCnc: >600 IU/mL — ABNORMAL HIGH (ref 0–34)

## 2022-05-18 ENCOUNTER — Encounter: Payer: Self-pay | Admitting: Nurse Practitioner

## 2022-05-18 ENCOUNTER — Other Ambulatory Visit: Payer: Self-pay | Admitting: Nurse Practitioner

## 2022-05-18 DIAGNOSIS — R7989 Other specified abnormal findings of blood chemistry: Secondary | ICD-10-CM

## 2022-05-18 NOTE — Progress Notes (Signed)
Noted  

## 2022-05-18 NOTE — Telephone Encounter (Signed)
-----   Message from Dani Gobble, NP sent at 05/18/2022  7:05 AM EDT ----- Lorain Childes, sent patient message detailing her thyroid labs and next steps.

## 2022-05-31 ENCOUNTER — Ambulatory Visit (HOSPITAL_COMMUNITY)
Admission: RE | Admit: 2022-05-31 | Discharge: 2022-05-31 | Disposition: A | Discharge status: Home / Self Care | Payer: 59 | Source: Ambulatory Visit | Attending: Nurse Practitioner | Admitting: Nurse Practitioner

## 2022-05-31 DIAGNOSIS — R7989 Other specified abnormal findings of blood chemistry: Secondary | ICD-10-CM | POA: Insufficient documentation

## 2022-06-01 NOTE — Progress Notes (Signed)
Just got all the test results back on her, so we need to schedule a visit to discuss them all.  Its not urgent.

## 2022-06-04 ENCOUNTER — Ambulatory Visit (INDEPENDENT_AMBULATORY_CARE_PROVIDER_SITE_OTHER): Payer: 59 | Admitting: Nurse Practitioner

## 2022-06-04 ENCOUNTER — Encounter: Payer: Self-pay | Admitting: Nurse Practitioner

## 2022-06-04 VITALS — BP 108/70 | HR 72 | Ht 62.0 in | Wt 166.4 lb

## 2022-06-04 DIAGNOSIS — E0590 Thyrotoxicosis, unspecified without thyrotoxic crisis or storm: Secondary | ICD-10-CM

## 2022-06-04 DIAGNOSIS — R7989 Other specified abnormal findings of blood chemistry: Secondary | ICD-10-CM

## 2022-06-04 DIAGNOSIS — R768 Other specified abnormal immunological findings in serum: Secondary | ICD-10-CM

## 2022-06-04 NOTE — Progress Notes (Signed)
06/04/2022     Endocrinology Follow Up Note    Subjective:    Patient ID: Nicole Freeman, female    DOB: 1995-03-25, PCP Avis Epley, PA-C.   Past Medical History:  Diagnosis Date   Chiari malformation type I (HCC)    Headache    Von Willebrand disease (HCC)     Past Surgical History:  Procedure Laterality Date   WISDOM TOOTH EXTRACTION      Social History   Socioeconomic History   Marital status: Married    Spouse name: Not on file   Number of children: 2   Years of education: Not on file   Highest education level: Not on file  Occupational History   Not on file  Tobacco Use   Smoking status: Former    Packs/day: .5    Types: Cigarettes   Smokeless tobacco: Never   Tobacco comments:    quit April 2019  Vaping Use   Vaping Use: Never used  Substance and Sexual Activity   Alcohol use: Not Currently    Comment: once in a blue moon   Drug use: No   Sexual activity: Yes    Birth control/protection: None  Other Topics Concern   Not on file  Social History Narrative   Right Handed   Lives in a one story    Drinks Caffeine    Lives with husband   Social Determinants of Health   Financial Resource Strain: Low Risk  (06/01/2021)   Overall Financial Resource Strain (CARDIA)    Difficulty of Paying Living Expenses: Not hard at all  Food Insecurity: No Food Insecurity (06/01/2021)   Hunger Vital Sign    Worried About Running Out of Food in the Last Year: Never true    Ran Out of Food in the Last Year: Never true  Transportation Needs: No Transportation Needs (06/01/2021)   PRAPARE - Administrator, Civil Service (Medical): No    Lack of Transportation (Non-Medical): No  Physical Activity: Insufficiently Active (06/01/2021)   Exercise Vital Sign    Days of Exercise per Week: 4 days    Minutes of Exercise per Session: 30 min  Stress: No Stress Concern Present (06/01/2021)   [name] of Occupational Health -  Occupational Stress Questionnaire    Feeling of Stress : Not at all  Social Connections: Moderately Integrated (06/01/2021)   Social Connection and Isolation Panel [NHANES]    Frequency of Communication with Friends and Family: More than three times a week    Frequency of Social Gatherings with Friends and Family: Three times a week    Attends Religious Services: 1 to 4 times per year    Active Member of Clubs or Organizations: No    Attends Banker Meetings: Never    Marital Status: Married    Family History  Problem Relation Age of Onset   Diabetes Maternal Aunt    Breast cancer Maternal Aunt    Breast cancer Paternal Aunt    Lung cancer Paternal Aunt    Other Maternal Grandmother        malformed kidney    Outpatient Encounter Medications as of 06/04/2022  Medication Sig   levonorgestrel (MIRENA) 20 MCG/DAY IUD 1 each by Intrauterine route once.   SUMAtriptan (IMITREX) 100 MG tablet Take 100 mg by mouth daily.   acetaminophen (TYLENOL) 325 MG tablet Take 2 tablets (650 mg total) by mouth every 6 (six) hours as needed (for  pain scale < 4). (Patient not taking: Reported on 09/19/2021)   amoxicillin-clavulanate (AUGMENTIN) 875-125 MG tablet Take 1 tablet by mouth every 12 (twelve) hours. (Patient not taking: Reported on 05/16/2022)   brompheniramine-pseudoephedrine-DM 30-2-10 MG/5ML syrup Take 5 mLs by mouth 4 (four) times daily as needed. (Patient not taking: Reported on 05/16/2022)   cetirizine (ZYRTEC) 10 MG tablet Take 1 tablet (10 mg total) by mouth daily. (Patient not taking: Reported on 05/16/2022)   Erenumab-aooe (AIMOVIG) 140 MG/ML SOAJ Inject ONE pen (140mg  total) into THE SKIN every 28 DAYS (Patient not taking: Reported on 05/16/2022)   fluticasone (FLONASE) 50 MCG/ACT nasal spray Place 2 sprays into both nostrils daily. (Patient not taking: Reported on 05/16/2022)   ibuprofen (ADVIL) 600 MG tablet Take 1 tablet (600 mg total) by mouth every 6 (six) hours. (Patient not  taking: Reported on 09/19/2021)   nitrofurantoin, macrocrystal-monohydrate, (MACROBID) 100 MG capsule Take 1 capsule (100 mg total) by mouth 2 (two) times daily. (Patient not taking: Reported on 03/13/2022)   ondansetron (ZOFRAN-ODT) 4 MG disintegrating tablet Take 1 tablet (4 mg total) by mouth every 8 (eight) hours as needed for nausea or vomiting. (Patient not taking: Reported on 05/16/2022)   phenazopyridine (PYRIDIUM) 200 MG tablet Take 1 tablet (200 mg total) by mouth 3 (three) times daily as needed for pain. (Patient not taking: Reported on 03/13/2022)   Prenatal Vit-Fe Fumarate-FA (PRENATAL VITAMIN PO) Take 2 tablets by mouth daily. (Patient not taking: Reported on 09/19/2021)   tranexamic acid (LYSTEDA) 650 MG TABS tablet Take 3 tablets (1,950 mg total) by mouth 3 (three) times daily. (Patient not taking: Reported on 09/19/2021)   verapamil (CALAN-SR) 120 MG CR tablet Take 1 tablet (120 mg total) by mouth at bedtime. (Patient not taking: Reported on 11/16/2021)   No facility-administered encounter medications on file as of 06/04/2022.    ALLERGIES: No Known Allergies  VACCINATION STATUS: Immunization History  Administered Date(s) Administered   Tdap 06/01/2021     HPI  Nicole Freeman is 27 y.o. female who presents today with a medical history as above. she is being seen in consultation for hyperthyroidism requested by Avis Epley, PA-C.  she has been dealing with symptoms of palpitations, weight loss (without changing diet), anxiety, hot flashes, alternating constipation and diarrhea, tremors, heart burn, insomnia, inability to be full after eating, and mild intermittent dysphagia and sore throat for 4. These symptoms are progressively worsening and troubling to her.  her most recent thyroid labs revealed suppressed TSH of 0.007 on 04/05/22.  She notes these symptoms all started about 4 years ago following the birth of her second child.  The symptoms have come and gone  but recently have been worse following the birth of her third child.  she denies choking, shortness of breath, no recent voice change.    she denies known family history of thyroid dysfunction and denies family hx of thyroid cancer. she denies personal history of goiter. she is not on any anti-thyroid medications nor on any thyroid hormone supplements. Denies use of Biotin containing supplements.  she is willing to proceed with appropriate work up and therapy for thyrotoxicosis.  She has history of Von Willebrand disease, Chiari Malformation, and migraines.   Review of systems  Constitutional: + fluctuating body weight-recent weight loss without changing diet, current Body mass index is 30.43 kg/m., + fatigue, + subjective hyperthermia, no subjective hypothermia, + insomnia Eyes: no blurry vision, no xerophthalmia ENT: + sore throat (usually in am), no  nodules palpated in throat, + intermittent dysphagia/odynophagia, no hoarseness Cardiovascular: no chest pain, no shortness of breath, + palpitations, no leg swelling Respiratory: no cough, no shortness of breath Gastrointestinal: no nausea/vomiting, alternating constipation/diarrhea Skin: no rashes, no hyperemia Neurological: + tremors, no numbness, no tingling, no dizziness Psychiatric: no depression, + anxiety   Objective:    BP 108/70 (BP Location: Left Arm, Patient Position: Sitting, Cuff Size: Normal)   Pulse 72   Ht 5\' 2"  (1.575 m)   Wt 166 lb 6.4 oz (75.5 kg)   BMI 30.43 kg/m   Wt Readings from Last 3 Encounters:  06/04/22 166 lb 6.4 oz (75.5 kg)  05/16/22 164 lb 9.6 oz (74.7 kg)  03/13/22 162 lb (73.5 kg)     BP Readings from Last 3 Encounters:  06/04/22 108/70  05/16/22 110/76  03/20/22 123/71                          Physical Exam- Limited  Constitutional:  Body mass index is 30.43 kg/m. , not in acute distress, mildly anxious state of mind Eyes:  EOMI, no exophthalmos Musculoskeletal: no gross deformities,  strength intact in all four extremities, no gross restriction of joint movements Skin:  no rashes, no hyperemia Neurological: no tremor with outstretched hands, DTR normal in BLE   CMP     Component Value Date/Time   NA 134 (L) 03/17/2021 0544   NA 135 (L) 01/06/2014 1059   K 3.8 03/17/2021 0544   K 4.3 01/06/2014 1059   CL 104 03/17/2021 0544   CL 102 01/06/2014 1059   CO2 19 (L) 03/17/2021 0544   CO2 28 01/06/2014 1059   GLUCOSE 95 03/17/2021 0544   GLUCOSE 126 (H) 01/06/2014 1059   BUN 14 03/17/2021 0544   BUN 14 01/06/2014 1059   CREATININE 0.70 03/17/2021 0544   CREATININE 0.88 01/06/2014 1059   CALCIUM 9.1 03/17/2021 0544   CALCIUM 9.8 01/06/2014 1059   PROT 8.1 05/21/2017 1341   PROT 8.2 01/06/2014 1059   ALBUMIN 4.7 05/21/2017 1341   ALBUMIN 4.4 01/06/2014 1059   AST 16 05/21/2017 1341   AST 21 01/06/2014 1059   ALT 14 05/21/2017 1341   ALT 20 01/06/2014 1059   ALKPHOS 43 05/21/2017 1341   ALKPHOS 70 01/06/2014 1059   BILITOT 0.6 05/21/2017 1341   BILITOT 0.8 01/06/2014 1059   GFRNONAA >60 03/17/2021 0544   GFRNONAA >60 01/06/2014 1059   GFRNONAA >60 03/02/2013 1347   GFRAA >60 05/21/2017 1341   GFRAA >60 01/06/2014 1059   GFRAA >60 03/02/2013 1347     CBC    Component Value Date/Time   WBC 9.7 09/07/2021 0700   RBC 3.77 (L) 09/07/2021 0700   HGB 10.5 (L) 09/07/2021 0700   HGB 10.7 (L) 06/01/2021 0806   HCT 32.2 (L) 09/07/2021 0700   HCT 32.1 (L) 06/01/2021 0806   PLT 259 09/07/2021 0700   PLT 263 06/01/2021 0806   MCV 85.4 09/07/2021 0700   MCV 94 06/01/2021 0806   MCV 97 01/06/2014 1059   MCH 27.9 09/07/2021 0700   MCHC 32.6 09/07/2021 0700   RDW 13.7 09/07/2021 0700   RDW 12.1 06/01/2021 0806   RDW 12.5 01/06/2014 1059   LYMPHSABS 1.2 02/23/2021 1345   LYMPHSABS 0.9 (L) 01/06/2014 1059   MONOABS 1.1 (H) 08/10/2016 2213   MONOABS 0.8 01/06/2014 1059   EOSABS 0.1 02/23/2021 1345   EOSABS 0.1 01/06/2014 1059  BASOSABS 0.0 02/23/2021 1345    BASOSABS 0.0 01/06/2014 1059     Diabetic Labs (most recent): No results found for: "HGBA1C", "MICROALBUR"  Lipid Panel  No results found for: "CHOL", "TRIG", "HDL", "CHOLHDL", "VLDL", "LDLCALC", "LDLDIRECT", "LABVLDL"   Lab Results  Component Value Date   TSH 14.300 (H) 05/16/2022   TSH 3.270 02/13/2018   TSH 2.03 10/31/2012   FREET4 0.65 (L) 05/16/2022     Thyroid US from 05/31/22 CLINICAL DATA:  Hypothyroid.   EXAM: THYROID ULTRASOUND   TECHNIQUE: Ultrasound examination of the thyroid gland and adjacent soft tissues was performed.   COMPARISON:  None Available.   FINDINGS: Parenchymal Echotexture: Moderately heterogenous   Isthmus: 0.4 cm   Right lobe: 4.2 x 1.8 x 1.9 cm   Left lobe: 5.1 x 1.5 x 1.7 cm   _________________________________________________________   Estimated total number of nodules >/= 1 cm: 0   Number of spongiform nodules >/=  2 cm not described below (TR1): 0   Number of mixed cystic and solid nodules >/= 1.5 cm not described below (TR2): 0   _________________________________________________________   No discrete nodules are seen within the thyroid gland. Normal vascularity on color Doppler imaging.   IMPRESSION: Mildly enlarged and heterogeneous thyroid gland most consistent with thyroiditis.   No discrete thyroid nodules are evident. No significant hypervascularity.     Electronically Signed   By: Malachy Moan M.D.   On: 05/31/2022 16:20     Latest Reference Range & Units 10/31/12 15:13 02/13/18 09:52 05/16/22 14:29  TSH 0.450 - 4.500 uIU/mL  3.270 14.300 (H)  Thyroid Stimulating Horm uIU/mL 2.03    Triiodothyronine,Free,Serum 2.0 - 4.4 pg/mL   2.3  T4,Free(Direct) 0.82 - 1.77 ng/dL   4.09 (L)  Free Thyroxine 0.76 - 1.46 ng/dL 8.11    Thyroperoxidase Ab SerPl-aCnc 0 - 34 IU/mL   >600 (H)  Thyroglobulin Antibody 0.0 - 0.9 IU/mL   38.9 (H)  (H): Data is abnormally high (L): Data is abnormally  low    Assessment & Plan:   1. Abnormal TSH 2. Positive thyroid antibodies  she is being seen at a kind request of Avis Epley, PA-C.  Her recent thyroid labs show positive antibodies, indicating autoimmune thyroid dysfunction.  Labs are indicating more hypothyroidism which can sometimes happen after acute thyroiditis as a way to reset the thyroid.  Her ultrasound shows mild enlargement with heterogeneous tissue, consistent with acute thyroiditis as well.  I did discuss the need for another set of thyroid labs in 6 weeks to assess thyroid trend to see if this levels itself out without intervention.  If it is still under-active at that time, we will start low dose thyroid hormone replacement.        -Patient is advised to maintain close follow up with Avis Epley, PA-C for primary care needs.    I spent  31  minutes in the care of the patient today including review of labs from Thyroid Function, CMP, and other relevant labs ; imaging/biopsy records (current and previous including abstractions from other facilities); face-to-face time discussing  her lab results and symptoms, medications doses, her options of short and long term treatment based on the latest standards of care / guidelines;   and documenting the encounter.  Nicole Freeman  participated in the discussions, expressed understanding, and voiced agreement with the above plans.  All questions were answered to her satisfaction. she is encouraged to contact clinic should she have any  questions or concerns prior to her return visit.  Follow up plan: Return in about 6 weeks (around 07/16/2022) for Thyroid follow up, Previsit labs.   Thank you for involving me in the care of this pleasant patient, and I will continue to update you with her progress.   Ronny Bacon, [facility] [address] [place], [place] [address] Phone: [phone] Fax:  [phone]  06/04/2022, 8:45 AM

## 2022-06-06 ENCOUNTER — Ambulatory Visit
Admission: RE | Admit: 2022-06-06 | Discharge: 2022-06-06 | Disposition: A | Discharge status: Home / Self Care | Payer: 59 | Source: Ambulatory Visit | Attending: Nurse Practitioner | Admitting: Nurse Practitioner

## 2022-06-06 ENCOUNTER — Other Ambulatory Visit: Payer: Self-pay

## 2022-06-06 ENCOUNTER — Encounter: Payer: Self-pay | Admitting: Nurse Practitioner

## 2022-06-06 VITALS — BP 137/89 | HR 81 | Temp 97.9°F | Resp 20

## 2022-06-06 DIAGNOSIS — M542 Cervicalgia: Secondary | ICD-10-CM | POA: Diagnosis not present

## 2022-06-06 MED ORDER — TIZANIDINE HCL 4 MG PO TABS: 4.0000 mg | ORAL_TABLET | Freq: Three times a day (TID) | ORAL | 0 refills | Status: DC | PRN

## 2022-06-06 NOTE — ED Provider Notes (Signed)
[facility]    CSN: [id number] Arrival date & time: 06/06/22  1353      History   Chief Complaint Chief Complaint  Patient presents with   Neck Injury    Pain in neck and behind ear/eye and sore throat. - Entered by patient    HPI Nicole Freeman is a 27 y.o. female.   Patient presents today for 1 day history of right neck pain. Reports the pain is behind her right ear, moves down to her neck, and also includes pressure behind her right eye.  No recent accident, fall, trauma, or injury to the right upper extremity or neck.  No ear pain or drainage, recent cough, congestion, fever.  Reports the pain is worse with certain movements.  Reports she slept on a heating pad last night which did seem to help, but then pain returned when she stopped this morning.  Reports yesterday, the pain gave her a migraine, so she laid down.  Does have a history of migraines.  No numbness or tingling in the right upper extremity or weakness in the right upper extremity.  Has taken for the pain which did not help very much.    Past Medical History:  Diagnosis Date   Chiari malformation type I (HCC)    Headache    Von Willebrand disease [facility])     Patient Active Problem List   Diagnosis Date Noted   Encounter for insertion of mirena IUD    Migraines 02/23/2021   Chiari malformation type I (HCC)    Allergic contact dermatitis 05/12/2018   Von Willebrand disease 05/28/2017    Past Surgical History:  Procedure Laterality Date   WISDOM TOOTH EXTRACTION      OB History     Gravida  3   Para  3   Term  3   Preterm      AB      Living  3      SAB      IAB      Ectopic      Multiple  0   Live Births  3            Home Medications    Prior to Admission medications   Medication Sig Start Date End Date Taking? Authorizing Provider  tiZANidine (ZANAFLEX) 4 MG tablet Take 1 tablet (4 mg total) by mouth every 8 (eight) hours as needed for muscle  spasms. Do not take with alcohol or while driving or operating heavy machinery.  May cause drowsiness. 06/06/22  Yes Valentino Nose, NP  acetaminophen (TYLENOL) 325 MG tablet Take 2 tablets (650 mg total) by mouth every 6 (six) hours as needed (for pain scale < 4). Patient not taking: Reported on 09/19/2021 09/08/21   Gerrit Heck, CNM  cetirizine (ZYRTEC) 10 MG tablet Take 1 tablet (10 mg total) by mouth daily. Patient not taking: Reported on 05/16/2022 03/20/22   Leath-Warren, Sadie Haber, NP  Dorise Hiss (AIMOVIG) 140 MG/ML SOAJ Inject ONE pen (140mg  total) into THE SKIN every 28 DAYS Patient not taking: Reported on 05/16/2022 03/13/22   Drema Dallas, DO  fluticasone (FLONASE) 50 MCG/ACT nasal spray Place 2 sprays into both nostrils daily. Patient not taking: Reported on 05/16/2022 03/20/22   Leath-Warren, Sadie Haber, NP  ibuprofen (ADVIL) 600 MG tablet Take 1 tablet (600 mg total) by mouth every 6 (six) hours. Patient not taking: Reported on 09/19/2021 09/08/21   Gerrit Heck, CNM  levonorgestrel (MIRENA) 20 MCG/DAY  IUD 1 each by Intrauterine route once.    [provider]  ondansetron (ZOFRAN-ODT) 4 MG disintegrating tablet Take 1 tablet (4 mg total) by mouth every 8 (eight) hours as needed for nausea or vomiting. Patient not taking: Reported on 05/16/2022 10/17/21   Drema Dallas, DO  Prenatal Vit-Fe Fumarate-FA (PRENATAL VITAMIN PO) Take 2 tablets by mouth daily. Patient not taking: Reported on 09/19/2021    [provider]  SUMAtriptan (IMITREX) 100 MG tablet Take 100 mg by mouth as needed. 05/10/22   [provider]  tranexamic acid (LYSTEDA) 650 MG TABS tablet Take 3 tablets (1,950 mg total) by mouth 3 (three) times daily. Patient not taking: Reported on 09/19/2021 09/08/21   Gerrit Heck, CNM  verapamil (CALAN-SR) 120 MG CR tablet Take 1 tablet (120 mg total) by mouth at bedtime. Patient not taking: Reported on 11/16/2021 05/23/21   Drema Dallas, DO    Family  History Family History  Problem Relation Age of Onset   Diabetes Maternal Aunt    Breast cancer Maternal Aunt    Breast cancer Paternal Aunt    Lung cancer Paternal Aunt    Other Maternal Grandmother        malformed kidney    Social History Social History   Tobacco Use   Smoking status: Former    Packs/day: .5    Types: Cigarettes   Smokeless tobacco: Never   Tobacco comments:    quit April 2019  Vaping Use   Vaping Use: Never used  Substance Use Topics   Alcohol use: Not Currently    Comment: once in a blue moon   Drug use: No     Allergies   Patient has no known allergies.   Review of Systems Review of Systems Per HPI  Physical Exam Triage Vital Signs ED Triage Vitals  Enc Vitals Group     BP 06/06/22 1400 137/89     Pulse Rate 06/06/22 1400 81     Resp 06/06/22 1400 20     Temp 06/06/22 1400 97.9 F (36.6 C)     Temp Source 06/06/22 1400 Oral     SpO2 06/06/22 1400 98 %     Weight --      Height --      Head Circumference --      Peak Flow --      Pain Score 06/06/22 1401 4     Pain Loc --      Pain Edu? --      Excl. in GC? --    No data found.  Updated Vital Signs BP 137/89 (BP Location: Right Arm)   Pulse 81   Temp 97.9 F (36.6 C) (Oral)   Resp 20   SpO2 98%   Breastfeeding No   Visual Acuity Right Eye Distance:   Left Eye Distance:   Bilateral Distance:    Right Eye Near:   Left Eye Near:    Bilateral Near:     Physical Exam Vitals and nursing note reviewed.  Constitutional:      General: She is not in acute distress.    Appearance: Normal appearance. She is not toxic-appearing.  HENT:     Head:      Comments: Pain to area marked; no erythema, swelling, bruising or obvious deformity.  No decreased ROM of neck.      Right Ear: Tympanic membrane, ear canal and external ear normal. There is no impacted cerumen.     Left  Ear: Tympanic membrane, ear canal and external ear normal. There is no impacted cerumen.      Mouth/Throat:     Mouth: Mucous membranes are moist.     Pharynx: Oropharynx is clear.  Eyes:     General:        Right eye: No discharge.        Left eye: No discharge.     Extraocular Movements: Extraocular movements intact.  Pulmonary:     Effort: Pulmonary effort is normal. No respiratory distress.  Musculoskeletal:     Cervical back: Full passive range of motion without pain. No erythema, rigidity or torticollis. No pain with movement, spinous process tenderness or muscular tenderness.  Skin:    General: Skin is warm and dry.     Capillary Refill: Capillary refill takes less than 2 seconds.     Coloration: Skin is not jaundiced or pale.     Findings: No erythema.  Neurological:     Mental Status: She is alert and oriented to person, place, and time.  Psychiatric:        Behavior: Behavior is cooperative.      UC Treatments / Results  Labs (all labs ordered are listed, but only abnormal results are displayed) Labs Reviewed - No data to display  EKG   Radiology No results found.  Procedures Procedures (including critical care time)  Medications Ordered in UC Medications - No data to display  Initial Impression / Assessment and Plan / UC Course  I have reviewed the triage vital signs and the nursing notes.  Pertinent labs & imaging results that were available during my care of the patient were reviewed by me and considered in my medical decision making (see chart for details).   Patient is well-appearing, normotensive, afebrile, not tachycardic, not tachypneic, oxygenating well on room air.    1. Neck pain Suspect muscular etiology VS and examination today are reassuring Start ibuprofen in addition to Tylenol, light ROM/stretching exercises Can take tizanidine as needed for pain Seek care if symptoms persist or worsen despite treatment  The patient was given the opportunity to ask questions.  All questions answered to their satisfaction.  The patient is in  agreement to this plan.    Final Clinical Impressions(s) / UC Diagnoses   Final diagnoses:  Neck pain     Discharge Instructions      It sounds like you may have a neck strain or may be getting a tension type migraine.   You can take ibuprofen 800 mg every 8 hours as needed for pain as well as the tizanidine at night time.  Seek care for persistent or worsening symptoms despite treatment.    ED Prescriptions     Medication Sig Dispense Auth. Provider   tiZANidine (ZANAFLEX) 4 MG tablet Take 1 tablet (4 mg total) by mouth every 8 (eight) hours as needed for muscle spasms. Do not take with alcohol or while driving or operating heavy machinery.  May cause drowsiness. 30 tablet Valentino Nose, NP      PDMP not reviewed this encounter.   Valentino Nose, NP 06/06/22 [phone]

## 2022-06-06 NOTE — ED Triage Notes (Addendum)
Pt reports right posterior ear pain with intermittent radiation to right side of neck and eye for last several days.   Pt reports discussed with endocrinologist and was told to follow-up with urgent care. Currently under evaluation for possible autoimmune disease.  Denies any known injury.

## 2022-06-06 NOTE — Discharge Instructions (Addendum)
It sounds like you may have a neck strain or may be getting a tension type migraine.   You can take ibuprofen 800 mg every 8 hours as needed for pain as well as the tizanidine at night time.  Seek care for persistent or worsening symptoms despite treatment.

## 2022-06-19 ENCOUNTER — Encounter: Payer: Self-pay | Admitting: Nurse Practitioner

## 2022-06-19 NOTE — Telephone Encounter (Signed)
See patient message about rescheduling appt.

## 2022-06-25 ENCOUNTER — Ambulatory Visit (INDEPENDENT_AMBULATORY_CARE_PROVIDER_SITE_OTHER): Payer: 59 | Admitting: Adult Health

## 2022-06-25 ENCOUNTER — Encounter: Payer: Self-pay | Admitting: Adult Health

## 2022-06-25 VITALS — BP 113/77 | HR 81 | Ht 62.0 in | Wt 171.0 lb

## 2022-06-25 DIAGNOSIS — Z30432 Encounter for removal of intrauterine contraceptive device: Secondary | ICD-10-CM | POA: Diagnosis not present

## 2022-06-25 DIAGNOSIS — Z30017 Encounter for initial prescription of implantable subdermal contraceptive: Secondary | ICD-10-CM

## 2022-06-25 MED ORDER — ETONOGESTREL 68 MG ~~LOC~~ IMPL
68.0000 mg | DRUG_IMPLANT | Freq: Once | SUBCUTANEOUS | Status: AC
  Administered 2022-06-25: 68 mg via SUBCUTANEOUS

## 2022-06-25 NOTE — Progress Notes (Addendum)
  Subjective:     Patient ID: Nicole Freeman, female   DOB: [birth year], 27 y.o.   MRN: [id number]  HPI Nicole Freeman is a 27 year old white female, married, G3P3 in requesting IUD removal and nexplanon insertion.    Component Value Date/Time   DIAGPAP  06/13/2020 0954    - Negative for intraepithelial lesion or malignancy (NILM)   HPVHIGH Negative 06/13/2020 0954   ADEQPAP  06/13/2020 0954    Satisfactory for evaluation; transformation zone component PRESENT.   PCP is Terie Purser PA  Review of Systems For IUD removal and nexplanon insertion Reviewed past medical,surgical, social and family history. Reviewed medications and allergies.     Objective:   Physical Exam BP 113/77 (BP Location: Left Arm, Patient Position: Sitting, Cuff Size: Normal)   Pulse 81   Ht 5\' 2"  (1.575 m)   Wt 171 lb (77.6 kg)   Breastfeeding No   BMI 31.28 kg/m  Consent signed for IUD removal. Consent signed for nexplanon insertion. Time out called. Skin warm and dry.Pelvic: external genitalia is normal in appearance no lesions, vagina is pink,urethra has no lesions or masses noted, cervix:smooth and bulbous,IUD strings grasped with forceps and pt asked to cough and IUD easily removed, uterus: normal size, shape and contour, non tender, no masses felt, adnexa: no masses or tenderness noted. Bladder is non tender and no masses felt.   Left arm cleansed with betadine, and injected with 1.5 cc 2% lidocaine and waited til numb. Nexplanon easily inserted and steri strips applied.Rod easily palpated by provider and pt. Pressure dressing applied.    Examination chaperoned by Tish RN Fall risk is low Flowsheet Row Procedure visit from 06/25/2022 in [facility] for [facility] at [facility] Total Score 0        Upstream - 06/25/22 1010       Pregnancy Intention Screening   Does the patient want to become pregnant in the next year? No    Does the patient's partner want to become  pregnant in the next year? No    Would the patient like to discuss contraceptive options today? No      Contraception Wrap Up   Current Method IUD or IUS    End Method Hormonal Implant    Contraception Counseling Provided Yes             Assessment:      1. Encounter for IUD removal IUD removed   2. Nexplanon insertion Lot [id number] 2025-11 Use condoms x 2 weeks, keep clean and dry x 24 hours, no heavy lifting, keep steri strips on x 72 hours, Keep pressure dressing on x 24 hours. Follow up prn problems.     Plan:     Return in 1 year for pap and physical

## 2022-06-25 NOTE — Addendum Note (Signed)
Addended by: Moss Mc on: 06/25/2022 12:37 PM   Modules accepted: Orders

## 2022-06-25 NOTE — Patient Instructions (Signed)
Use condoms x 2 weeks, keep clean and dry x 24 hours, no heavy lifting, keep steri strips on x 72 hours, Keep pressure dressing on x 24 hours. Follow up prn problems.  

## 2022-07-14 LAB — TSH: TSH: 6.38 u[IU]/mL — ABNORMAL HIGH (ref 0.450–4.500)

## 2022-07-14 LAB — T3, FREE: T3, Free: 3.4 pg/mL (ref 2.0–4.4)

## 2022-07-14 LAB — T4, FREE: Free T4: 1.17 ng/dL (ref 0.82–1.77)

## 2022-07-16 ENCOUNTER — Ambulatory Visit: Payer: 59 | Admitting: Nurse Practitioner

## 2022-07-24 ENCOUNTER — Ambulatory Visit (INDEPENDENT_AMBULATORY_CARE_PROVIDER_SITE_OTHER): Payer: 59 | Admitting: Nurse Practitioner

## 2022-07-24 ENCOUNTER — Encounter: Payer: Self-pay | Admitting: Nurse Practitioner

## 2022-07-24 VITALS — BP 111/73 | HR 80 | Ht 62.0 in | Wt 169.8 lb

## 2022-07-24 DIAGNOSIS — R768 Other specified abnormal immunological findings in serum: Secondary | ICD-10-CM

## 2022-07-24 DIAGNOSIS — R7989 Other specified abnormal findings of blood chemistry: Secondary | ICD-10-CM | POA: Diagnosis not present

## 2022-07-24 NOTE — Progress Notes (Signed)
07/24/2022     Endocrinology Follow Up Note    Subjective:    Patient ID: Nicole Freeman, female    DOB: [birth year], PCP Avis Epley, PA-C.   Past Medical History:  Diagnosis Date   Chiari malformation type I (HCC)    Headache    Von Willebrand disease (HCC)     Past Surgical History:  Procedure Laterality Date   WISDOM TOOTH EXTRACTION      Social History   Socioeconomic History   Marital status: Married    Spouse name: Not on file   Number of children: 2   Years of education: Not on file   Highest education level: Not on file  Occupational History   Not on file  Tobacco Use   Smoking status: Former    Packs/day: .5    Types: Cigarettes   Smokeless tobacco: Never   Tobacco comments:    quit April 2019  Vaping Use   Vaping Use: Never used  Substance and Sexual Activity   Alcohol use: Not Currently    Comment: once in a blue moon   Drug use: No   Sexual activity: Yes    Birth control/protection: Implant  Other Topics Concern   Not on file  Social History Narrative   Right Handed   Lives in a one story    Drinks Caffeine    Lives with husband   Social Determinants of Health   Financial Resource Strain: Low Risk  (06/01/2021)   Overall Financial Resource Strain (CARDIA)    Difficulty of Paying Living Expenses: Not hard at all  Food Insecurity: No Food Insecurity (06/01/2021)   Hunger Vital Sign    Worried About Running Out of Food in the Last Year: Never true    Ran Out of Food in the Last Year: Never true  Transportation Needs: No Transportation Needs (06/01/2021)   PRAPARE - Administrator, Civil Service (Medical): No    Lack of Transportation (Non-Medical): No  Physical Activity: Insufficiently Active (06/01/2021)   Exercise Vital Sign    Days of Exercise per Week: 4 days    Minutes of Exercise per Session: 30 min  Stress: No Stress Concern Present (06/01/2021)   [name] of Occupational Health -  Occupational Stress Questionnaire    Feeling of Stress : Not at all  Social Connections: Moderately Integrated (06/01/2021)   Social Connection and Isolation Panel [NHANES]    Frequency of Communication with Friends and Family: More than three times a week    Frequency of Social Gatherings with Friends and Family: Three times a week    Attends Religious Services: 1 to 4 times per year    Active Member of Clubs or Organizations: No    Attends Banker Meetings: Never    Marital Status: Married    Family History  Problem Relation Age of Onset   Diabetes Maternal Aunt    Breast cancer Maternal Aunt    Breast cancer Paternal Aunt    Lung cancer Paternal Aunt    Other Maternal Grandmother        malformed kidney    Outpatient Encounter Medications as of 07/24/2022  Medication Sig   acetaminophen (TYLENOL) 325 MG tablet Take 2 tablets (650 mg total) by mouth every 6 (six) hours as needed (for pain scale < 4).   etonogestrel (NEXPLANON) 68 MG IMPL implant 1 each by Subdermal route once.   ondansetron (ZOFRAN-ODT) 4 MG disintegrating tablet  Take 1 tablet (4 mg total) by mouth every 8 (eight) hours as needed for nausea or vomiting.   SUMAtriptan (IMITREX) 100 MG tablet Take 100 mg by mouth as needed.   tiZANidine (ZANAFLEX) 4 MG tablet Take 1 tablet (4 mg total) by mouth every 8 (eight) hours as needed for muscle spasms. Do not take with alcohol or while driving or operating heavy machinery.  May cause drowsiness.   [DISCONTINUED] cetirizine (ZYRTEC) 10 MG tablet Take 1 tablet (10 mg total) by mouth daily. (Patient not taking: Reported on 07/24/2022)   [DISCONTINUED] Erenumab-aooe (AIMOVIG) 140 MG/ML SOAJ Inject ONE pen (140mg  total) into THE SKIN every 28 DAYS (Patient not taking: Reported on 05/16/2022)   [DISCONTINUED] fluticasone (FLONASE) 50 MCG/ACT nasal spray Place 2 sprays into both nostrils daily. (Patient not taking: Reported on 05/16/2022)   [DISCONTINUED] ibuprofen (ADVIL)  600 MG tablet Take 1 tablet (600 mg total) by mouth every 6 (six) hours. (Patient not taking: Reported on 09/19/2021)   [DISCONTINUED] Prenatal Vit-Fe Fumarate-FA (PRENATAL VITAMIN PO) Take 2 tablets by mouth daily. (Patient not taking: Reported on 09/19/2021)   [DISCONTINUED] verapamil (CALAN-SR) 120 MG CR tablet Take 1 tablet (120 mg total) by mouth at bedtime. (Patient not taking: Reported on 11/16/2021)   No facility-administered encounter medications on file as of 07/24/2022.    ALLERGIES: No Known Allergies  VACCINATION STATUS: Immunization History  Administered Date(s) Administered   Tdap 06/01/2021     HPI  Nicole Freeman is 27 y.o. female who presents today with a medical history as above. she is being seen in consultation for hyperthyroidism requested by Avis Epley, PA-C.  she has been dealing with symptoms of palpitations, weight loss (without changing diet), anxiety, hot flashes, alternating constipation and diarrhea, tremors, heart burn, insomnia, inability to be full after eating, and mild intermittent dysphagia and sore throat for 4. These symptoms are progressively worsening and troubling to her.  her most recent thyroid labs revealed suppressed TSH of 0.007 on 04/05/22.  She notes these symptoms all started about 4 years ago following the birth of her second child.  The symptoms have come and gone but recently have been worse following the birth of her third child.  she denies choking, shortness of breath, no recent voice change.    she denies known family history of thyroid dysfunction and denies family hx of thyroid cancer. she denies personal history of goiter. she is not on any anti-thyroid medications nor on any thyroid hormone supplements. Denies use of Biotin containing supplements.  she is willing to proceed with appropriate work up and therapy for thyrotoxicosis.  She has history of Von Willebrand disease, Chiari Malformation, and migraines.   She also notes she recently had her Mirena IUD removed, and while she was waiting to have Nexplanon inserted, she says her headaches went away and she felt the best she has in a long time.  After the Nexplanon was inserted, her symptoms started to resurface once again.   Review of systems  Constitutional: + fluctuating body weight, current Body mass index is 31.06 kg/m., + fatigue, + temperature fluctuations Eyes: no blurry vision, no xerophthalmia ENT: + sore throat (usually in am), no nodules palpated in throat, + intermittent dysphagia/odynophagia, no hoarseness Cardiovascular: no chest pain, + shortness of breath with exertion, + palpitations, no leg swelling Respiratory: no cough, no shortness of breath Gastrointestinal: + intermittent nausea, alternating constipation/diarrhea (mostly diarrhea), acid reflux Skin: no rashes, no hyperemia Neurological: + shaky, no numbness,  no tingling, no dizziness Psychiatric: no depression, + anxiety, + mental fog   Objective:    BP 111/73 (BP Location: Left Arm, Patient Position: Sitting, Cuff Size: Large)   Pulse 80   Ht 5\' 2"  (1.575 m)   Wt 169 lb 12.8 oz (77 kg)   BMI 31.06 kg/m   Wt Readings from Last 3 Encounters:  07/24/22 169 lb 12.8 oz (77 kg)  06/25/22 171 lb (77.6 kg)  06/04/22 166 lb 6.4 oz (75.5 kg)     BP Readings from Last 3 Encounters:  07/24/22 111/73  06/25/22 113/77  06/06/22 137/89                          Physical Exam- Limited  Constitutional:  Body mass index is 31.06 kg/m. , not in acute distress, mildly anxious state of mind Eyes:  EOMI, no exophthalmos Musculoskeletal: no gross deformities, strength intact in all four extremities, no gross restriction of joint movements Skin:  no rashes, no hyperemia Neurological: no tremor with outstretched hands, DTR normal in BLE   CMP     Component Value Date/Time   NA 134 (L) 03/17/2021 0544   NA 135 (L) 01/06/2014 1059   K 3.8 03/17/2021 0544   K 4.3  01/06/2014 1059   CL 104 03/17/2021 0544   CL 102 01/06/2014 1059   CO2 19 (L) 03/17/2021 0544   CO2 28 01/06/2014 1059   GLUCOSE 95 03/17/2021 0544   GLUCOSE 126 (H) 01/06/2014 1059   BUN 14 03/17/2021 0544   BUN 14 01/06/2014 1059   CREATININE 0.70 03/17/2021 0544   CREATININE 0.88 01/06/2014 1059   CALCIUM 9.1 03/17/2021 0544   CALCIUM 9.8 01/06/2014 1059   PROT 8.1 05/21/2017 1341   PROT 8.2 01/06/2014 1059   ALBUMIN 4.7 05/21/2017 1341   ALBUMIN 4.4 01/06/2014 1059   AST 16 05/21/2017 1341   AST 21 01/06/2014 1059   ALT 14 05/21/2017 1341   ALT 20 01/06/2014 1059   ALKPHOS 43 05/21/2017 1341   ALKPHOS 70 01/06/2014 1059   BILITOT 0.6 05/21/2017 1341   BILITOT 0.8 01/06/2014 1059   GFRNONAA >60 03/17/2021 0544   GFRNONAA >60 01/06/2014 1059   GFRNONAA >60 03/02/2013 1347   GFRAA >60 05/21/2017 1341   GFRAA >60 01/06/2014 1059   GFRAA >60 03/02/2013 1347     CBC    Component Value Date/Time   WBC 9.7 09/07/2021 0700   RBC 3.77 (L) 09/07/2021 0700   HGB 10.5 (L) 09/07/2021 0700   HGB 10.7 (L) 06/01/2021 0806   HCT 32.2 (L) 09/07/2021 0700   HCT 32.1 (L) 06/01/2021 0806   PLT 259 09/07/2021 0700   PLT 263 06/01/2021 0806   MCV 85.4 09/07/2021 0700   MCV 94 06/01/2021 0806   MCV 97 01/06/2014 1059   MCH 27.9 09/07/2021 0700   MCHC 32.6 09/07/2021 0700   RDW 13.7 09/07/2021 0700   RDW 12.1 06/01/2021 0806   RDW 12.5 01/06/2014 1059   LYMPHSABS 1.2 02/23/2021 1345   LYMPHSABS 0.9 (L) 01/06/2014 1059   MONOABS 1.1 (H) 08/10/2016 2213   MONOABS 0.8 01/06/2014 1059   EOSABS 0.1 02/23/2021 1345   EOSABS 0.1 01/06/2014 1059   BASOSABS 0.0 02/23/2021 1345   BASOSABS 0.0 01/06/2014 1059     Diabetic Labs (most recent): No results found for: "HGBA1C", "MICROALBUR"  Lipid Panel  No results found for: "CHOL", "TRIG", "HDL", "CHOLHDL", "VLDL", "LDLCALC", "LDLDIRECT", "LABVLDL"  Lab Results  Component Value Date   TSH 6.380 (H) 07/13/2022   TSH 14.300 (H)  05/16/2022   TSH 3.270 02/13/2018   TSH 2.03 10/31/2012   FREET4 1.17 07/13/2022   FREET4 0.65 (L) 05/16/2022     Thyroid US from 05/31/22 CLINICAL DATA:  Hypothyroid.   EXAM: THYROID ULTRASOUND   TECHNIQUE: Ultrasound examination of the thyroid gland and adjacent soft tissues was performed.   COMPARISON:  None Available.   FINDINGS: Parenchymal Echotexture: Moderately heterogenous   Isthmus: 0.4 cm   Right lobe: 4.2 x 1.8 x 1.9 cm   Left lobe: 5.1 x 1.5 x 1.7 cm   _________________________________________________________   Estimated total number of nodules >/= 1 cm: 0   Number of spongiform nodules >/=  2 cm not described below (TR1): 0   Number of mixed cystic and solid nodules >/= 1.5 cm not described below (TR2): 0   _________________________________________________________   No discrete nodules are seen within the thyroid gland. Normal vascularity on color Doppler imaging.   IMPRESSION: Mildly enlarged and heterogeneous thyroid gland most consistent with thyroiditis.   No discrete thyroid nodules are evident. No significant hypervascularity.     Electronically Signed   By: Malachy Moan M.D.   On: 05/31/2022 16:20     Latest Reference Range & Units 10/31/12 15:13 02/13/18 09:52 05/16/22 14:29 07/13/22 08:54  TSH 0.450 - 4.500 uIU/mL  3.270 14.300 (H) 6.380 (H)  Thyroid Stimulating Horm uIU/mL 2.03     Triiodothyronine,Free,Serum 2.0 - 4.4 pg/mL   2.3 3.4  T4,Free(Direct) 0.82 - 1.77 ng/dL   1.61 (L) 0.96  Free Thyroxine 0.76 - 1.46 ng/dL 0.45     Thyroperoxidase Ab SerPl-aCnc 0 - 34 IU/mL   >600 (H)   Thyroglobulin Antibody 0.0 - 0.9 IU/mL   38.9 (H)   (H): Data is abnormally high (L): Data is abnormally low    Assessment & Plan:   1. Abnormal TSH 2. Positive thyroid antibodies  she is being seen at a kind request of Avis Epley, PA-C.  Her recent thyroid labs show positive antibodies, indicating autoimmune thyroid  dysfunction.  Labs are indicating more hypothyroidism which can sometimes happen after acute thyroiditis as a way to reset the thyroid, however they are improving without intervention.    -Her ultrasound showed mild enlargement with heterogeneous tissue, consistent with acute thyroiditis as well.  I did discuss the need for another set of thyroid labs in 6 weeks to assess thyroid trend to see if levels continue to work itself out without intervention.  If it is still under-active at that time, we may start low dose thyroid hormone replacement.        -Patient is advised to maintain close follow up with Avis Epley, PA-C for primary care needs.     I spent  26  minutes in the care of the patient today including review of labs from Thyroid Function, CMP, and other relevant labs ; imaging/biopsy records (current and previous including abstractions from other facilities); face-to-face time discussing  her lab results and symptoms, medications doses, her options of short and long term treatment based on the latest standards of care / guidelines;   and documenting the encounter.  Nicole Freeman  participated in the discussions, expressed understanding, and voiced agreement with the above plans.  All questions were answered to her satisfaction. she is encouraged to contact clinic should she have any questions or concerns prior to her return visit.  Follow  up plan: Return in about 6 weeks (around 09/04/2022) for Thyroid follow up, Previsit labs.   Thank you for involving me in the care of this pleasant patient, and I will continue to update you with her progress.   Ronny Bacon, [facility] [address] [place], [place] [address] Phone: [phone] Fax: [phone]  07/24/2022, 4:01 PM

## 2022-08-28 LAB — T4, FREE: Free T4: 0.91 ng/dL (ref 0.82–1.77)

## 2022-08-28 LAB — T3, FREE: T3, Free: 2.8 pg/mL (ref 2.0–4.4)

## 2022-08-28 LAB — TSH: TSH: 2.6 u[IU]/mL (ref 0.450–4.500)

## 2022-08-29 ENCOUNTER — Encounter: Payer: 59 | Admitting: Advanced Practice Midwife

## 2022-09-05 ENCOUNTER — Ambulatory Visit (INDEPENDENT_AMBULATORY_CARE_PROVIDER_SITE_OTHER): Payer: 59 | Admitting: Nurse Practitioner

## 2022-09-05 ENCOUNTER — Encounter: Payer: Self-pay | Admitting: Nurse Practitioner

## 2022-09-05 VITALS — BP 100/66 | HR 89 | Ht 62.0 in | Wt 171.8 lb

## 2022-09-05 DIAGNOSIS — R768 Other specified abnormal immunological findings in serum: Secondary | ICD-10-CM | POA: Diagnosis not present

## 2022-09-05 DIAGNOSIS — R7989 Other specified abnormal findings of blood chemistry: Secondary | ICD-10-CM

## 2022-09-05 NOTE — Progress Notes (Signed)
09/05/2022     Endocrinology Follow Up Note    Subjective:    Patient ID: Nicole Freeman, female    DOB: 1995-09-09, PCP Avis Epley, PA-C.   Past Medical History:  Diagnosis Date   Chiari malformation type I (HCC)    Headache    Von Willebrand disease (HCC)     Past Surgical History:  Procedure Laterality Date   WISDOM TOOTH EXTRACTION      Social History   Socioeconomic History   Marital status: Married    Spouse name: Not on file   Number of children: 2   Years of education: Not on file   Highest education level: Not on file  Occupational History   Not on file  Tobacco Use   Smoking status: Former    Current packs/day: 0.50    Types: Cigarettes   Smokeless tobacco: Never   Tobacco comments:    quit April 2019  Vaping Use   Vaping status: Never Used  Substance and Sexual Activity   Alcohol use: Not Currently    Comment: once in a blue moon   Drug use: No   Sexual activity: Yes    Birth control/protection: Implant  Other Topics Concern   Not on file  Social History Narrative   Right Handed   Lives in a one story    Drinks Caffeine    Lives with husband   Social Determinants of Health   Financial Resource Strain: Low Risk  (06/01/2021)   Overall Financial Resource Strain (CARDIA)    Difficulty of Paying Living Expenses: Not hard at all  Food Insecurity: No Food Insecurity (06/01/2021)   Hunger Vital Sign    Worried About Running Out of Food in the Last Year: Never true    Ran Out of Food in the Last Year: Never true  Transportation Needs: No Transportation Needs (06/01/2021)   PRAPARE - Administrator, Civil Service (Medical): No    Lack of Transportation (Non-Medical): No  Physical Activity: Insufficiently Active (06/01/2021)   Exercise Vital Sign    Days of Exercise per Week: 4 days    Minutes of Exercise per Session: 30 min  Stress: No Stress Concern Present (06/01/2021)   [name] of  Occupational Health - Occupational Stress Questionnaire    Feeling of Stress : Not at all  Social Connections: Moderately Integrated (06/01/2021)   Social Connection and Isolation Panel [NHANES]    Frequency of Communication with Friends and Family: More than three times a week    Frequency of Social Gatherings with Friends and Family: Three times a week    Attends Religious Services: 1 to 4 times per year    Active Member of Clubs or Organizations: No    Attends Banker Meetings: Never    Marital Status: Married    Family History  Problem Relation Age of Onset   Diabetes Maternal Aunt    Breast cancer Maternal Aunt    Breast cancer Paternal Aunt    Lung cancer Paternal Aunt    Other Maternal Grandmother        malformed kidney    Outpatient Encounter Medications as of 09/05/2022  Medication Sig   acetaminophen (TYLENOL) 325 MG tablet Take 2 tablets (650 mg total) by mouth every 6 (six) hours as needed (for pain scale < 4).   ondansetron (ZOFRAN-ODT) 4 MG disintegrating tablet Take 1 tablet (4 mg total) by mouth every 8 (eight) hours as  needed for nausea or vomiting.   SUMAtriptan (IMITREX) 100 MG tablet Take 100 mg by mouth as needed.   tiZANidine (ZANAFLEX) 4 MG tablet Take 1 tablet (4 mg total) by mouth every 8 (eight) hours as needed for muscle spasms. Do not take with alcohol or while driving or operating heavy machinery.  May cause drowsiness.   [DISCONTINUED] etonogestrel (NEXPLANON) 68 MG IMPL implant 1 each by Subdermal route once. (Patient not taking: Reported on 09/05/2022)   No facility-administered encounter medications on file as of 09/05/2022.    ALLERGIES: No Known Allergies  VACCINATION STATUS: Immunization History  Administered Date(s) Administered   Tdap 06/01/2021     HPI  Nicole Freeman is 27 y.o. female who presents today with a medical history as above. she is being seen in consultation for hyperthyroidism requested by  Avis Epley, PA-C.  she has been dealing with symptoms of palpitations, weight loss (without changing diet), anxiety, hot flashes, alternating constipation and diarrhea, tremors, heart burn, insomnia, inability to be full after eating, and mild intermittent dysphagia and sore throat for 4. These symptoms are progressively worsening and troubling to her.  her most recent thyroid labs revealed suppressed TSH of 0.007 on 04/05/22.  She notes these symptoms all started about 4 years ago following the birth of her second child.  The symptoms have come and gone but recently have been worse following the birth of her third child.  she denies choking, shortness of breath, no recent voice change.    she denies known family history of thyroid dysfunction and denies family hx of thyroid cancer. she denies personal history of goiter. she is not on any anti-thyroid medications nor on any thyroid hormone supplements. Denies use of Biotin containing supplements.  she is willing to proceed with appropriate work up and therapy for thyrotoxicosis.  She has history of Von Willebrand disease, Chiari Malformation, and migraines.  She also notes she recently had her Mirena IUD removed, and while she was waiting to have Nexplanon inserted, she says her headaches went away and she felt the best she has in a long time.  After the Nexplanon was inserted, her symptoms started to resurface once again.   Review of systems  Constitutional: + Minimally fluctuating body weight,  current Body mass index is 31.42 kg/m. , no fatigue, + intermittent subjective hyperthermia, no subjective hypothermia Eyes: no blurry vision, no xerophthalmia ENT: no sore throat, no nodules palpated in throat, no dysphagia/odynophagia, no hoarseness Cardiovascular: no chest pain, no shortness of breath, no palpitations, no leg swelling Respiratory: no cough, no shortness of breath Gastrointestinal: intermittent indigestion,  nausea Musculoskeletal: no muscle/joint aches Skin: no rashes, no hyperemia Neurological: no tremors, no numbness, no tingling, no dizziness Psychiatric: no depression, no anxiety   Objective:    BP 100/66 (BP Location: Left Arm, Patient Position: Sitting, Cuff Size: Normal)   Pulse 89   Ht 5\' 2"  (1.575 m)   Wt 171 lb 12.8 oz (77.9 kg)   BMI 31.42 kg/m   Wt Readings from Last 3 Encounters:  09/05/22 171 lb 12.8 oz (77.9 kg)  07/24/22 169 lb 12.8 oz (77 kg)  06/25/22 171 lb (77.6 kg)     BP Readings from Last 3 Encounters:  09/05/22 100/66  07/24/22 111/73  06/25/22 113/77                          Physical Exam- Limited  Constitutional:  Body mass index  is 31.42 kg/m. , not in acute distress, mildly anxious state of mind Eyes:  EOMI, no exophthalmos Musculoskeletal: no gross deformities, strength intact in all four extremities, no gross restriction of joint movements Skin:  no rashes, no hyperemia Neurological: no tremor with outstretched hands, DTR normal in BLE   CMP     Component Value Date/Time   NA 134 (L) 03/17/2021 0544   NA 135 (L) 01/06/2014 1059   K 3.8 03/17/2021 0544   K 4.3 01/06/2014 1059   CL 104 03/17/2021 0544   CL 102 01/06/2014 1059   CO2 19 (L) 03/17/2021 0544   CO2 28 01/06/2014 1059   GLUCOSE 95 03/17/2021 0544   GLUCOSE 126 (H) 01/06/2014 1059   BUN 14 03/17/2021 0544   BUN 14 01/06/2014 1059   CREATININE 0.70 03/17/2021 0544   CREATININE 0.88 01/06/2014 1059   CALCIUM 9.1 03/17/2021 0544   CALCIUM 9.8 01/06/2014 1059   PROT 8.1 05/21/2017 1341   PROT 8.2 01/06/2014 1059   ALBUMIN 4.7 05/21/2017 1341   ALBUMIN 4.4 01/06/2014 1059   AST 16 05/21/2017 1341   AST 21 01/06/2014 1059   ALT 14 05/21/2017 1341   ALT 20 01/06/2014 1059   ALKPHOS 43 05/21/2017 1341   ALKPHOS 70 01/06/2014 1059   BILITOT 0.6 05/21/2017 1341   BILITOT 0.8 01/06/2014 1059   GFRNONAA >60 03/17/2021 0544   GFRNONAA >60 01/06/2014 1059   GFRNONAA >60  03/02/2013 1347   GFRAA >60 05/21/2017 1341   GFRAA >60 01/06/2014 1059   GFRAA >60 03/02/2013 1347     CBC    Component Value Date/Time   WBC 9.7 09/07/2021 0700   RBC 3.77 (L) 09/07/2021 0700   HGB 10.5 (L) 09/07/2021 0700   HGB 10.7 (L) 06/01/2021 0806   HCT 32.2 (L) 09/07/2021 0700   HCT 32.1 (L) 06/01/2021 0806   PLT 259 09/07/2021 0700   PLT 263 06/01/2021 0806   MCV 85.4 09/07/2021 0700   MCV 94 06/01/2021 0806   MCV 97 01/06/2014 1059   MCH 27.9 09/07/2021 0700   MCHC 32.6 09/07/2021 0700   RDW 13.7 09/07/2021 0700   RDW 12.1 06/01/2021 0806   RDW 12.5 01/06/2014 1059   LYMPHSABS 1.2 02/23/2021 1345   LYMPHSABS 0.9 (L) 01/06/2014 1059   MONOABS 1.1 (H) 08/10/2016 2213   MONOABS 0.8 01/06/2014 1059   EOSABS 0.1 02/23/2021 1345   EOSABS 0.1 01/06/2014 1059   BASOSABS 0.0 02/23/2021 1345   BASOSABS 0.0 01/06/2014 1059     Diabetic Labs (most recent): No results found for: "HGBA1C", "MICROALBUR"  Lipid Panel  No results found for: "CHOL", "TRIG", "HDL", "CHOLHDL", "VLDL", "LDLCALC", "LDLDIRECT", "LABVLDL"   Lab Results  Component Value Date   TSH 2.600 08/27/2022   TSH 6.380 (H) 07/13/2022   TSH 14.300 (H) 05/16/2022   TSH 3.270 02/13/2018   TSH 2.03 10/31/2012   FREET4 0.91 08/27/2022   FREET4 1.17 07/13/2022   FREET4 0.65 (L) 05/16/2022     Thyroid US from 05/31/22 CLINICAL DATA:  Hypothyroid.   EXAM: THYROID ULTRASOUND   TECHNIQUE: Ultrasound examination of the thyroid gland and adjacent soft tissues was performed.   COMPARISON:  None Available.   FINDINGS: Parenchymal Echotexture: Moderately heterogenous   Isthmus: 0.4 cm   Right lobe: 4.2 x 1.8 x 1.9 cm   Left lobe: 5.1 x 1.5 x 1.7 cm   _________________________________________________________   Estimated total number of nodules >/= 1 cm: 0   Number of  spongiform nodules >/=  2 cm not described below (TR1): 0   Number of mixed cystic and solid nodules >/= 1.5 cm not  described below (TR2): 0   _________________________________________________________   No discrete nodules are seen within the thyroid gland. Normal vascularity on color Doppler imaging.   IMPRESSION: Mildly enlarged and heterogeneous thyroid gland most consistent with thyroiditis.   No discrete thyroid nodules are evident. No significant hypervascularity.     Electronically Signed   By: Malachy Moan M.D.   On: 05/31/2022 16:20     Latest Reference Range & Units 10/31/12 15:13 02/13/18 09:52 05/16/22 14:29 07/13/22 08:54 08/27/22 15:52  TSH 0.450 - 4.500 uIU/mL  3.270 14.300 (H) 6.380 (H) 2.600  Thyroid Stimulating Horm uIU/mL 2.03      Triiodothyronine,Free,Serum 2.0 - 4.4 pg/mL   2.3 3.4 2.8  T4,Free(Direct) 0.82 - 1.77 ng/dL   4.09 (L) 8.11 9.14  Free Thyroxine 0.76 - 1.46 ng/dL 7.82      Thyroperoxidase Ab SerPl-aCnc 0 - 34 IU/mL   >600 (H)    Thyroglobulin Antibody 0.0 - 0.9 IU/mL   38.9 (H)    (H): Data is abnormally high (L): Data is abnormally low    Assessment & Plan:   1. Abnormal TSH 2. Positive thyroid antibodies  she is being seen at a kind request of Avis Epley, PA-C.  Her recent thyroid labs show positive antibodies, indicating autoimmune thyroid dysfunction.  Labs are indicating more hypothyroidism which can sometimes happen after acute thyroiditis as a way to reset the thyroid, however they are improving without intervention.    -Her ultrasound showed mild enlargement with heterogeneous tissue, consistent with acute thyroiditis as well.  -Her repeat thyroid labs show euthyroid presentation.  Looks like her thyroid has reset itself and is now working properly.  She also notes that she had her Nexplanon removed and since then her symptoms have improved drastically.      -Patient is advised to maintain close follow up with Avis Epley, PA-C for primary care needs.    I spent  22  minutes in the care of the patient today  including review of labs from Thyroid Function, CMP, and other relevant labs ; imaging/biopsy records (current and previous including abstractions from other facilities); face-to-face time discussing  her lab results and symptoms, medications doses, her options of short and long term treatment based on the latest standards of care / guidelines;   and documenting the encounter.  Nicole Freeman  participated in the discussions, expressed understanding, and voiced agreement with the above plans.  All questions were answered to her satisfaction. she is encouraged to contact clinic should she have any questions or concerns prior to her return visit.  Follow up plan: Return in about 3 months (around 12/06/2022) for Thyroid follow up, Previsit labs.   Thank you for involving me in the care of this pleasant patient, and I will continue to update you with her progress.   Ronny Bacon, [facility] [address] [place], [place] [address] Phone: [phone] Fax: [phone]  09/05/2022, 2:42 PM

## 2022-09-17 NOTE — Progress Notes (Deleted)
NEUROLOGY FOLLOW UP OFFICE NOTE  [name] [id number]  Assessment/Plan:   1.  Migraine without aura, without status migrainosus, not intractable 2. Pseudopapilledema - clinically stable. 3. 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  Her Chiari is borderline.  There is no abnormal morphology of the cerebellum, brainstem and upper cervical spinal cord.   Migraine prevention:  Aimovig 140mg  every 28 days  *** Tylenol or naproxen and Zofran ODT 4mg  for acute migraine treatment  Limit use of pain relievers to no more than 2 days out of week to prevent risk of rebound or medication-overuse headache. *** Keep headache diary Repeat eye exam.  Advised to have report sent to me *** Follow up 6 months.     Subjective:  Nicole Freeman is a 27 year old right-handed female with von Willebrand disease who follows up for headaches.   UPDATE: Repeat eye exam?  ***  *** Intensity: mild-moderate Duration:  1-2 hours with Tylenol Frequency:  approximately every 10-14 days  She has not seen the eye doctor recently.  Has an upcoming appointment.  No visual obscurations.  Sometimes notes high-tone tinnitus but not pulsatile tinnitus.  When she feels like she is going to have a bowel movement, she may develop a headache but not specifically aggravated by having a bowel movement.    Current NSAIDs/analgesics:  Tylenol, naproxen Current antiemetic:  Zofran Current CGRP inhibitor:  Aimovig 140mg       HISTORY:  She started having migraines in 2019 when she became pregnant with her second child.  She had gained about 60 lbs.  After her second child, she had an IUD implanted.  They occurred every other week and then gradually increased in frequency.  Around April or May 2021, headaches became daily.  She endorses severe bitemporal/top of head pounding pain associated with nausea, photophobia, phonophobia, double vision/blurred  vision.  They would last until she would lay down in a cool dark room.  Se was taking tylenol or naproxen almost daily.  She also endorsed pulsatile tinnitus.  On a couple of occasions, she noted transient darkening of vision.  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  She was referred to a neurologist in [place].  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  She was started topiramate.  Headaches had almost resolved.  She also lost 10 lbs.  She was referred to ophthalmology, Dr. Wynell Balloon this month who diagnosed pseudopapilledema noting elevated RNFL with hyperemic nerves but no disc edema or HVF defects.     Past medications:  topiramate (effective), verapamil, magnesium oxide 400mg  (made headaches worse), riboflavin 400mg  (made headaches worse)  PAST MEDICAL HISTORY: Past Medical History:  Diagnosis Date   Chiari malformation type I (HCC)    Headache    Von Willebrand disease (HCC)     MEDICATIONS: Current Outpatient Medications on File Prior to Visit  Medication Sig Dispense Refill   acetaminophen (TYLENOL) 325 MG tablet Take 2 tablets (650 mg total) by mouth every 6 (six) hours as needed (for pain scale < 4). 60 tablet 1   ondansetron (ZOFRAN-ODT) 4 MG disintegrating tablet Take 1 tablet (4 mg total) by mouth every 8 (eight) hours as needed for nausea or vomiting. 30 tablet 5   SUMAtriptan (IMITREX) 100 MG  tablet Take 100 mg by mouth as needed.     tiZANidine (ZANAFLEX) 4 MG tablet Take 1 tablet (4 mg total) by mouth every 8 (eight) hours as needed for muscle spasms. Do not take with alcohol or while driving or operating heavy machinery.  May cause drowsiness. 30 tablet 0   No current facility-administered medications on file prior to visit.    ALLERGIES: No Known  Allergies  FAMILY HISTORY: Family History  Problem Relation Age of Onset   Diabetes Maternal Aunt    Breast cancer Maternal Aunt    Breast cancer Paternal Aunt    Lung cancer Paternal Aunt    Other Maternal Grandmother        malformed kidney      Objective:  *** General: No acute distress.  Patient appears well-groomed.   Head:  Normocephalic/atraumatic Eyes:  Fundi examined but not visualized Neck: supple, no paraspinal tenderness, full range of motion Heart:  Regular rate and rhythm Neurological Exam: ***   Shon Millet, DO

## 2022-09-18 ENCOUNTER — Ambulatory Visit: Payer: 59 | Admitting: Neurology

## 2022-10-01 ENCOUNTER — Other Ambulatory Visit: Payer: Self-pay

## 2022-10-01 ENCOUNTER — Ambulatory Visit
Admission: RE | Admit: 2022-10-01 | Discharge: 2022-10-01 | Disposition: A | Discharge status: Home / Self Care | Payer: 59 | Source: Ambulatory Visit | Attending: Nurse Practitioner | Admitting: Nurse Practitioner

## 2022-10-01 VITALS — BP 124/82 | HR 74 | Temp 98.5°F | Resp 16

## 2022-10-01 DIAGNOSIS — R079 Chest pain, unspecified: Secondary | ICD-10-CM

## 2022-10-01 MED ORDER — MYLANTA MAXIMUM STRENGTH 400-400-40 MG/5ML PO SUSP: 15.0000 mL | Freq: Four times a day (QID) | ORAL | 0 refills | Status: DC | PRN

## 2022-10-01 MED ORDER — ALUM & MAG HYDROXIDE-SIMETH 200-200-20 MG/5ML PO SUSP
30.0000 mL | Freq: Once | ORAL | Status: AC
  Administered 2022-10-01: 30 mL via ORAL

## 2022-10-01 MED ORDER — PANTOPRAZOLE SODIUM 40 MG PO TBEC: 40.0000 mg | DELAYED_RELEASE_TABLET | Freq: Every day | ORAL | 0 refills | Status: DC

## 2022-10-01 MED ORDER — LIDOCAINE VISCOUS HCL 2 % MT SOLN
15.0000 mL | Freq: Once | OROMUCOSAL | Status: AC
  Administered 2022-10-01: 15 mL via OROMUCOSAL

## 2022-10-01 NOTE — ED Triage Notes (Signed)
Pt presents with sharp pain on left chest that comes and goes that started Saturday.Pt states she has a history of acid reflux. Pt taking antiacid and pecpid that has been helping.

## 2022-10-01 NOTE — ED Provider Notes (Signed)
[facility]    CSN: [id number] Arrival date & time: 10/01/22  1254      History   Chief Complaint Chief Complaint  Patient presents with   Chest Pain    HPI Nicole Freeman is a 27 y.o. female.   The history is provided by the patient.   The patient presents for complaints of left-sided chest pain that is been present for the past 2 to 3 days.  Patient states symptoms started at night during her sleep.  She states that when the pain started, it "came and went" and she also had some numbness and tingling in the left arm.  She states that since that time, her symptoms have continued to come and go, she states that the pain in her chest feels like a "dull ache".  She states that the numbness and tingling in her arm have been present for the past several weeks, so she is unsure if that is directly related.  She does state that she has been seeing an endocrinologist for thyroid disease.  She states that she currently is not on any medication for her thyroid as it is functioning normally at this time.  She denies radiation of pain into the lef arm or jaw, shortness of breath, difficulty breathing, nausea, vomiting, or diaphoresis with the chest pain.  She states that she does have a history of reflux disease.  She reports she has been taking omeprazole and Pepcid for her symptoms which does give her some relief.  Past Medical History:  Diagnosis Date   Chiari malformation type I (HCC)    Headache    Von Willebrand disease (HCC)     Patient Active Problem List   Diagnosis Date Noted   Nexplanon insertion 06/25/2022   Encounter for IUD removal 06/25/2022   Migraines 02/23/2021   Chiari malformation type I (HCC)    Allergic contact dermatitis 05/12/2018   Von Willebrand disease 05/28/2017    Past Surgical History:  Procedure Laterality Date   WISDOM TOOTH EXTRACTION      OB History     Gravida  3   Para  3   Term  3   Preterm      AB       Living  3      SAB      IAB      Ectopic      Multiple  0   Live Births  3            Home Medications    Prior to Admission medications   Medication Sig Start Date End Date Taking? Authorizing Provider  alum & mag hydroxide-simeth (MYLANTA MAXIMUM STRENGTH) 400-400-40 MG/5ML suspension Take 15 mLs by mouth every 6 (six) hours as needed for indigestion. 10/01/22  Yes Sharlene Mccluskey-Warren, Sadie Haber, NP  pantoprazole (PROTONIX) 40 MG tablet Take 1 tablet (40 mg total) by mouth daily. 10/01/22  Yes Karsen Fellows-Warren, Sadie Haber, NP  acetaminophen (TYLENOL) 325 MG tablet Take 2 tablets (650 mg total) by mouth every 6 (six) hours as needed (for pain scale < 4). 09/08/21   Gerrit Heck, CNM  ondansetron (ZOFRAN-ODT) 4 MG disintegrating tablet Take 1 tablet (4 mg total) by mouth every 8 (eight) hours as needed for nausea or vomiting. 10/17/21   Drema Dallas, DO  SUMAtriptan (IMITREX) 100 MG tablet Take 100 mg by mouth as needed. 05/10/22   [provider]  tiZANidine (ZANAFLEX) 4 MG tablet Take 1 tablet (4 mg  total) by mouth every 8 (eight) hours as needed for muscle spasms. Do not take with alcohol or while driving or operating heavy machinery.  May cause drowsiness. 06/06/22   Valentino Nose, NP    Family History Family History  Problem Relation Age of Onset   Diabetes Maternal Aunt    Breast cancer Maternal Aunt    Breast cancer Paternal Aunt    Lung cancer Paternal Aunt    Other Maternal Grandmother        malformed kidney    Social History Social History   Tobacco Use   Smoking status: Former    Current packs/day: 0.50    Types: Cigarettes   Smokeless tobacco: Never   Tobacco comments:    quit April 2019  Vaping Use   Vaping status: Never Used  Substance Use Topics   Alcohol use: Not Currently    Comment: once in a blue moon   Drug use: No     Allergies   Patient has no known allergies.   Review of Systems Review of Systems Per HPI  Physical  Exam Triage Vital Signs ED Triage Vitals  Encounter Vitals Group     BP 10/01/22 1344 124/82     Systolic BP Percentile --      Diastolic BP Percentile --      Pulse Rate 10/01/22 1344 74     Resp 10/01/22 1344 16     Temp 10/01/22 1344 98.5 F (36.9 C)     Temp Source 10/01/22 1344 Oral     SpO2 10/01/22 1344 99 %     Weight --      Height --      Head Circumference --      Peak Flow --      Pain Score 10/01/22 1346 4     Pain Loc --      Pain Education --      Exclude from Growth Chart --    No data found.  Updated Vital Signs BP 124/82 (BP Location: Right Arm)   Pulse 74   Temp 98.5 F (36.9 C) (Oral)   Resp 16   LMP 09/17/2022 (Approximate)   SpO2 99%   Breastfeeding No   Visual Acuity Right Eye Distance:   Left Eye Distance:   Bilateral Distance:    Right Eye Near:   Left Eye Near:    Bilateral Near:     Physical Exam Vitals and nursing note reviewed.  Constitutional:      General: She is not in acute distress.    Appearance: She is well-developed.  HENT:     Head: Normocephalic.  Eyes:     Extraocular Movements: Extraocular movements intact.     Conjunctiva/sclera: Conjunctivae normal.     Pupils: Pupils are equal, round, and reactive to light.  Cardiovascular:     Rate and Rhythm: Regular rhythm.     Pulses: Normal pulses.     Heart sounds: Normal heart sounds.  Pulmonary:     Effort: Pulmonary effort is normal. No respiratory distress.     Breath sounds: Normal breath sounds. No stridor. No wheezing, rhonchi or rales.  Abdominal:     General: Bowel sounds are normal.     Palpations: Abdomen is soft.     Tenderness: There is no abdominal tenderness.     Comments: GI cocktail was administered.  Patient reports good relief of symptoms after GI cocktail.  Musculoskeletal:     Cervical back: Normal range of  motion.  Lymphadenopathy:     Cervical: No cervical adenopathy.  Skin:    General: Skin is warm and dry.  Neurological:     General: No  focal deficit present.     Mental Status: She is alert and oriented to person, place, and time.  Psychiatric:        Mood and Affect: Mood normal.        Behavior: Behavior normal.      UC Treatments / Results  Labs (all labs ordered are listed, but only abnormal results are displayed) Labs Reviewed - No data to display  EKG   Radiology No results found.  Procedures Procedures (including critical care time)  Medications Ordered in UC Medications  alum & mag hydroxide-simeth (MAALOX/MYLANTA) 200-200-20 MG/5ML suspension 30 mL (30 mLs Oral Given 10/01/22 1427)  lidocaine (XYLOCAINE) 2 % viscous mouth solution 15 mL (15 mLs Mouth/Throat Given 10/01/22 1427)    Initial Impression / Assessment and Plan / UC Course  I have reviewed the triage vital signs and the nursing notes.  Pertinent labs & imaging results that were available during my care of the patient were reviewed by me and considered in my medical decision making (see chart for details).  The patient is well-appearing, she is in NAD, vital signs are stable.   GI cocktail was administered in the clinic along with the EKG.  EKG showed no STEMI, normal sinus rhythm, no other comparisons were available.  Patient reports that she did experience good relief of her symptoms after the GI cocktail was administered.  Symptoms appear to be related to reflux.  Protonix 40 mg prescribed for patient's reflux symptoms and Mylanta maximum strength for breakthrough symptoms.  Supportive care recommendations were provided and discussed with the patient to include over-the-counter analgesics, monitoring triggers for reflux symptoms, eating small meals, and eating 2 to 3 hours before bedtime.  Patient was given strict ER follow-up precautions.  Patient was in agreement with this plan of care and verbalizes understanding.  All questions were answered.  Patient stable for discharge. Final Clinical Impressions(s) / UC Diagnoses   Final diagnoses:   Chest pain, unspecified type     Discharge Instructions      The EKG was negative.  It appears that your symptoms are most likely caused by acid reflux as you had good improvement after the medication administered in the clinic. Take medication as prescribed. Increase fluids and allow for plenty of rest. May take over-the-counter Tylenol for pain or discomfort. Try to avoid triggers that may aggravate your reflux symptoms such as dairy, red meat, spicy foods, tomato-based foods, caffeine, or mint based foods. Make sure you are eating at least 2 to 3 hours before bedtime.  Make sure you are chewing your food well.  Recommend eating 6 small meals instead of 3 large meals. It may also be helpful to sleep with your head slightly elevated while symptoms persist. Go to the emergency department immediately if you experience worsening chest pain with shortness of breath, difficulty breathing, pain that radiates into the left jaw or left arm with nausea, vomiting, or sweating. As discussed, if medication does improve your symptoms, please follow-up with your primary care physician for further evaluation. Follow-up as needed.     ED Prescriptions     Medication Sig Dispense Auth. Provider   pantoprazole (PROTONIX) 40 MG tablet Take 1 tablet (40 mg total) by mouth daily. 30 tablet Carlotta Telfair-Warren, Sadie Haber, NP   alum & mag hydroxide-simeth (MYLANTA  MAXIMUM STRENGTH) 400-400-40 MG/5ML suspension Take 15 mLs by mouth every 6 (six) hours as needed for indigestion. 355 mL Alwyn Cordner-Warren, Sadie Haber, NP      PDMP not reviewed this encounter.   Abran Cantor, NP 10/01/22 1501

## 2022-10-01 NOTE — Discharge Instructions (Addendum)
The EKG was negative.  It appears that your symptoms are most likely caused by acid reflux as you had good improvement after the medication administered in the clinic. Take medication as prescribed. Increase fluids and allow for plenty of rest. May take over-the-counter Tylenol for pain or discomfort. Try to avoid triggers that may aggravate your reflux symptoms such as dairy, red meat, spicy foods, tomato-based foods, caffeine, or mint based foods. Make sure you are eating at least 2 to 3 hours before bedtime.  Make sure you are chewing your food well.  Recommend eating 6 small meals instead of 3 large meals. It may also be helpful to sleep with your head slightly elevated while symptoms persist. Go to the emergency department immediately if you experience worsening chest pain with shortness of breath, difficulty breathing, pain that radiates into the left jaw or left arm with nausea, vomiting, or sweating. As discussed, if medication does improve your symptoms, please follow-up with your primary care physician for further evaluation. Follow-up as needed.

## 2022-11-14 ENCOUNTER — Ambulatory Visit (HOSPITAL_COMMUNITY)
Admission: RE | Admit: 2022-11-14 | Discharge: 2022-11-14 | Disposition: A | Discharge status: Home / Self Care | Payer: 59 | Source: Ambulatory Visit | Attending: Family Medicine | Admitting: Family Medicine

## 2022-11-14 ENCOUNTER — Other Ambulatory Visit (HOSPITAL_COMMUNITY): Payer: Self-pay | Admitting: Family Medicine

## 2022-11-14 DIAGNOSIS — S239XXA Sprain of unspecified parts of thorax, initial encounter: Secondary | ICD-10-CM | POA: Diagnosis present

## 2022-12-06 LAB — T4, FREE: Free T4: 0.85 ng/dL (ref 0.82–1.77)

## 2022-12-06 LAB — T3, FREE: T3, Free: 2.9 pg/mL (ref 2.0–4.4)

## 2022-12-06 LAB — TSH: TSH: 2.5 u[IU]/mL (ref 0.450–4.500)

## 2022-12-10 ENCOUNTER — Ambulatory Visit: Payer: 59 | Admitting: Nurse Practitioner

## 2022-12-10 ENCOUNTER — Encounter: Payer: Self-pay | Admitting: Nurse Practitioner

## 2022-12-10 DIAGNOSIS — R768 Other specified abnormal immunological findings in serum: Secondary | ICD-10-CM

## 2022-12-10 DIAGNOSIS — R7989 Other specified abnormal findings of blood chemistry: Secondary | ICD-10-CM

## 2022-12-10 NOTE — Telephone Encounter (Signed)
See patient message

## 2022-12-11 ENCOUNTER — Encounter: Payer: Self-pay | Admitting: Adult Health

## 2022-12-11 ENCOUNTER — Ambulatory Visit: Payer: 59 | Admitting: Adult Health

## 2022-12-11 VITALS — BP 119/80 | HR 81 | Ht 62.0 in | Wt 180.5 lb

## 2022-12-11 DIAGNOSIS — R109 Unspecified abdominal pain: Secondary | ICD-10-CM

## 2022-12-11 DIAGNOSIS — N926 Irregular menstruation, unspecified: Secondary | ICD-10-CM

## 2022-12-11 DIAGNOSIS — Z30011 Encounter for initial prescription of contraceptive pills: Secondary | ICD-10-CM

## 2022-12-11 DIAGNOSIS — L709 Acne, unspecified: Secondary | ICD-10-CM

## 2022-12-11 DIAGNOSIS — Z3202 Encounter for pregnancy test, result negative: Secondary | ICD-10-CM

## 2022-12-11 LAB — POCT URINE PREGNANCY: Preg Test, Ur: NEGATIVE

## 2022-12-11 MED ORDER — SLYND 4 MG PO TABS: 1.0000 | ORAL_TABLET | Freq: Every day | ORAL | Status: DC

## 2022-12-11 NOTE — Progress Notes (Signed)
  Subjective:     Patient ID: Nicole Freeman, female   DOB: [birth year], 27 y.o.   MRN: [id number]  HPI Nicole Freeman is a 27 year old white female, married, [id number] in complaining of irregular periods for last 15 months, since daughter's birth and more acne with period. She also has cramping after sex at times.      Component Value Date/Time   DIAGPAP  06/13/2020 0954    - Negative for intraepithelial lesion or malignancy (NILM)   HPVHIGH Negative 06/13/2020 0954   ADEQPAP  06/13/2020 0954    Satisfactory for evaluation; transformation zone component PRESENT.   PCP is Terie Purser PA.  Review of Systems  +irregular periods for last 15 months, since daughter's birth, last period on 3 days, stopped, and started back 10/31 and still on + more acne with period + cramping after sex at times.  Has increased anxiety before period  Hx migraine with aura  Reviewed past medical,surgical, social and family history. Reviewed medications and allergies.     Objective:   Physical Exam BP 119/80 (BP Location: Left Arm, Patient Position: Sitting, Cuff Size: Normal)   Pulse 81   Ht 5\' 2"  (1.575 m)   Wt 180 lb 8 oz (81.9 kg)   LMP 12/06/2022   Breastfeeding No   BMI 33.01 kg/m  UPT is negative    Skin warm and dry.Pelvic: external genitalia is normal in appearance no lesions, vagina: +blood, no odor,urethra has no lesions or masses noted, cervix:smooth and bulbous, uterus: normal size, shape and contour, non tender, no masses felt, adnexa: no masses or tenderness noted. Bladder is non tender and no masses felt.    Examination chaperoned by Malachy Mood LPN   Assessment:     1. Pregnancy examination or test, negative result - POCT urine pregnancy  2. Irregular periods Periods not regular for last 15 months Will try slynd  to see if helps regulate and helps with anxiety before period and acne   3. Acne, unspecified acne type More acne on chin with period  4. Abdominal  cramps Cramps after sex at times  5. Encounter for initial prescription of contraceptive pills Will try slynd, 3 packs given can start today and use condoms for at least 1 pack     Meds ordered this encounter  Medications   Drospirenone (SLYND) 4 MG TABS    Sig: Take 1 tablet (4 mg total) by mouth daily.    Order Specific Question:   Supervising Provider    Answer:   Lazaro Arms [[id number]]    Plan:     Follow up in 10 weeks

## 2022-12-25 ENCOUNTER — Encounter: Payer: Self-pay | Admitting: Nurse Practitioner

## 2022-12-25 ENCOUNTER — Ambulatory Visit (INDEPENDENT_AMBULATORY_CARE_PROVIDER_SITE_OTHER): Payer: 59 | Admitting: Nurse Practitioner

## 2022-12-25 VITALS — BP 127/90 | HR 102 | Ht 62.0 in | Wt 180.6 lb

## 2022-12-25 DIAGNOSIS — R7989 Other specified abnormal findings of blood chemistry: Secondary | ICD-10-CM

## 2022-12-25 DIAGNOSIS — E063 Autoimmune thyroiditis: Secondary | ICD-10-CM

## 2022-12-25 DIAGNOSIS — R768 Other specified abnormal immunological findings in serum: Secondary | ICD-10-CM | POA: Diagnosis not present

## 2022-12-25 MED ORDER — LEVOTHYROXINE SODIUM 25 MCG PO TABS: 25.0000 ug | ORAL_TABLET | Freq: Every day | ORAL | 0 refills | Status: DC

## 2022-12-25 NOTE — Patient Instructions (Signed)

## 2022-12-25 NOTE — Progress Notes (Signed)
12/25/2022     Endocrinology Follow Up Note    Subjective:    Patient ID: Nicole Freeman, female    DOB: Aug 03, 1995, PCP Avis Epley, PA-C.   Past Medical History:  Diagnosis Date   Chiari malformation type I (HCC)    Headache    Von Willebrand disease (HCC)     Past Surgical History:  Procedure Laterality Date   WISDOM TOOTH EXTRACTION      Social History   Socioeconomic History   Marital status: Married    Spouse name: Not on file   Number of children: 2   Years of education: Not on file   Highest education level: Not on file  Occupational History   Not on file  Tobacco Use   Smoking status: Former    Current packs/day: 0.50    Types: Cigarettes   Smokeless tobacco: Never   Tobacco comments:    quit April 2019  Vaping Use   Vaping status: Never Used  Substance and Sexual Activity   Alcohol use: Yes    Comment: once in a blue moon   Drug use: No   Sexual activity: Yes    Birth control/protection: None, Condom  Other Topics Concern   Not on file  Social History Narrative   Right Handed   Lives in a one story    Drinks Caffeine    Lives with husband   Social Determinants of Health   Financial Resource Strain: Low Risk  (06/01/2021)   Overall Financial Resource Strain (CARDIA)    Difficulty of Paying Living Expenses: Not hard at all  Food Insecurity: No Food Insecurity (06/01/2021)   Hunger Vital Sign    Worried About Running Out of Food in the Last Year: Never true    Ran Out of Food in the Last Year: Never true  Transportation Needs: No Transportation Needs (06/01/2021)   PRAPARE - Administrator, Civil Service (Medical): No    Lack of Transportation (Non-Medical): No  Physical Activity: Insufficiently Active (06/01/2021)   Exercise Vital Sign    Days of Exercise per Week: 4 days    Minutes of Exercise per Session: 30 min  Stress: No Stress Concern Present (06/01/2021)   [name] of Occupational  Health - Occupational Stress Questionnaire    Feeling of Stress : Not at all  Social Connections: Moderately Integrated (06/01/2021)   Social Connection and Isolation Panel [NHANES]    Frequency of Communication with Friends and Family: More than three times a week    Frequency of Social Gatherings with Friends and Family: Three times a week    Attends Religious Services: 1 to 4 times per year    Active Member of Clubs or Organizations: No    Attends Banker Meetings: Never    Marital Status: Married    Family History  Problem Relation Age of Onset   Other Maternal Grandmother        malformed kidney   Diabetes Maternal Aunt    Breast cancer Maternal Aunt    Breast cancer Paternal Aunt    Lung cancer Paternal Aunt     Outpatient Encounter Medications as of 12/25/2022  Medication Sig   levothyroxine (SYNTHROID) 25 MCG tablet Take 1 tablet (25 mcg total) by mouth daily.   ondansetron (ZOFRAN-ODT) 4 MG disintegrating tablet Take 1 tablet (4 mg total) by mouth every 8 (eight) hours as needed for nausea or vomiting.   pantoprazole (PROTONIX) 40  MG tablet Take 1 tablet (40 mg total) by mouth daily.   SUMAtriptan (IMITREX) 100 MG tablet Take 100 mg by mouth as needed.   tiZANidine (ZANAFLEX) 4 MG tablet Take 1 tablet (4 mg total) by mouth every 8 (eight) hours as needed for muscle spasms. Do not take with alcohol or while driving or operating heavy machinery.  May cause drowsiness.   Drospirenone (SLYND) 4 MG TABS Take 1 tablet (4 mg total) by mouth daily. (Patient not taking: Reported on 12/25/2022)   No facility-administered encounter medications on file as of 12/25/2022.    ALLERGIES: Allergies  Allergen Reactions   Banana Other (See Comments)    Mouth and throat gets scratchy and itchy    VACCINATION STATUS: Immunization History  Administered Date(s) Administered   Tdap 06/01/2021     HPI  Nicole Freeman is 27 y.o. female who presents today  with a medical history as above. she is being seen in consultation for hyperthyroidism requested by Avis Epley, PA-C.  she has been dealing with symptoms of palpitations, weight loss (without changing diet), anxiety, hot flashes, alternating constipation and diarrhea, tremors, heart burn, insomnia, inability to be full after eating, and mild intermittent dysphagia and sore throat for 4. These symptoms are progressively worsening and troubling to her.  her most recent thyroid labs revealed suppressed TSH of 0.007 on 04/05/22.  She notes these symptoms all started about 4 years ago following the birth of her second child.  The symptoms have come and gone but recently have been worse following the birth of her third child.  she denies choking, shortness of breath, no recent voice change.    she denies known family history of thyroid dysfunction and denies family hx of thyroid cancer. she denies personal history of goiter. she is not on any anti-thyroid medications nor on any thyroid hormone supplements. Denies use of Biotin containing supplements.  she is willing to proceed with appropriate work up and therapy for thyrotoxicosis.  She has history of Von Willebrand disease, Chiari Malformation, and migraines.  She also notes she recently had her Mirena IUD removed, and while she was waiting to have Nexplanon inserted, she says her headaches went away and she felt the best she has in a long time.  After the Nexplanon was inserted, her symptoms started to resurface once again.   Review of systems  Constitutional: + Minimally fluctuating body weight,  current Body mass index is 33.03 kg/m. , no fatigue, + intermittent subjective hyperthermia, no subjective hypothermia Eyes: no blurry vision, no xerophthalmia ENT: no sore throat, no nodules palpated in throat, no dysphagia/odynophagia, no hoarseness Cardiovascular: no chest pain, no shortness of breath, no palpitations, no leg swelling Respiratory:  no cough, no shortness of breath Gastrointestinal: intermittent indigestion, nausea Musculoskeletal: no muscle/joint aches Skin: no rashes, no hyperemia Neurological: no tremors, no numbness, no tingling, no dizziness Psychiatric: no depression, no anxiety   Objective:    BP (!) 127/90 (BP Location: Left Arm, Patient Position: Sitting, Cuff Size: Large)   Pulse (!) 102   Ht 5\' 2"  (1.575 m)   Wt 180 lb 9.6 oz (81.9 kg)   LMP 12/06/2022   BMI 33.03 kg/m   Wt Readings from Last 3 Encounters:  12/25/22 180 lb 9.6 oz (81.9 kg)  12/11/22 180 lb 8 oz (81.9 kg)  09/05/22 171 lb 12.8 oz (77.9 kg)     BP Readings from Last 3 Encounters:  12/25/22 (!) 127/90  12/11/22 119/80  10/01/22  124/82     Physical Exam- Limited  Constitutional:  Body mass index is 33.03 kg/m. , not in acute distress, normal state of mind Eyes:  EOMI, no exophthalmos Musculoskeletal: no gross deformities, strength intact in all four extremities, no gross restriction of joint movements Skin:  no rashes, no hyperemia Neurological: no tremor with outstretched hands   CMP     Component Value Date/Time   NA 134 (L) 03/17/2021 0544   NA 135 (L) 01/06/2014 1059   K 3.8 03/17/2021 0544   K 4.3 01/06/2014 1059   CL 104 03/17/2021 0544   CL 102 01/06/2014 1059   CO2 19 (L) 03/17/2021 0544   CO2 28 01/06/2014 1059   GLUCOSE 95 03/17/2021 0544   GLUCOSE 126 (H) 01/06/2014 1059   BUN 14 03/17/2021 0544   BUN 14 01/06/2014 1059   CREATININE 0.70 03/17/2021 0544   CREATININE 0.88 01/06/2014 1059   CALCIUM 9.1 03/17/2021 0544   CALCIUM 9.8 01/06/2014 1059   PROT 8.1 05/21/2017 1341   PROT 8.2 01/06/2014 1059   ALBUMIN 4.7 05/21/2017 1341   ALBUMIN 4.4 01/06/2014 1059   AST 16 05/21/2017 1341   AST 21 01/06/2014 1059   ALT 14 05/21/2017 1341   ALT 20 01/06/2014 1059   ALKPHOS 43 05/21/2017 1341   ALKPHOS 70 01/06/2014 1059   BILITOT 0.6 05/21/2017 1341   BILITOT 0.8 01/06/2014 1059   GFRNONAA >60  03/17/2021 0544   GFRNONAA >60 01/06/2014 1059   GFRNONAA >60 03/02/2013 1347   GFRAA >60 05/21/2017 1341   GFRAA >60 01/06/2014 1059   GFRAA >60 03/02/2013 1347     CBC    Component Value Date/Time   WBC 9.7 09/07/2021 0700   RBC 3.77 (L) 09/07/2021 0700   HGB 10.5 (L) 09/07/2021 0700   HGB 10.7 (L) 06/01/2021 0806   HCT 32.2 (L) 09/07/2021 0700   HCT 32.1 (L) 06/01/2021 0806   PLT 259 09/07/2021 0700   PLT 263 06/01/2021 0806   MCV 85.4 09/07/2021 0700   MCV 94 06/01/2021 0806   MCV 97 01/06/2014 1059   MCH 27.9 09/07/2021 0700   MCHC 32.6 09/07/2021 0700   RDW 13.7 09/07/2021 0700   RDW 12.1 06/01/2021 0806   RDW 12.5 01/06/2014 1059   LYMPHSABS 1.2 02/23/2021 1345   LYMPHSABS 0.9 (L) 01/06/2014 1059   MONOABS 1.1 (H) 08/10/2016 2213   MONOABS 0.8 01/06/2014 1059   EOSABS 0.1 02/23/2021 1345   EOSABS 0.1 01/06/2014 1059   BASOSABS 0.0 02/23/2021 1345   BASOSABS 0.0 01/06/2014 1059     Diabetic Labs (most recent): No results found for: "HGBA1C", "MICROALBUR"  Lipid Panel  No results found for: "CHOL", "TRIG", "HDL", "CHOLHDL", "VLDL", "LDLCALC", "LDLDIRECT", "LABVLDL"   Lab Results  Component Value Date   TSH 2.500 12/05/2022   TSH 2.600 08/27/2022   TSH 6.380 (H) 07/13/2022   TSH 14.300 (H) 05/16/2022   TSH 3.270 02/13/2018   TSH 2.03 10/31/2012   FREET4 0.85 12/05/2022   FREET4 0.91 08/27/2022   FREET4 1.17 07/13/2022   FREET4 0.65 (L) 05/16/2022     Thyroid US from 05/31/22 CLINICAL DATA:  Hypothyroid.   EXAM: THYROID ULTRASOUND   TECHNIQUE: Ultrasound examination of the thyroid gland and adjacent soft tissues was performed.   COMPARISON:  None Available.   FINDINGS: Parenchymal Echotexture: Moderately heterogenous   Isthmus: 0.4 cm   Right lobe: 4.2 x 1.8 x 1.9 cm   Left lobe: 5.1 x 1.5 x 1.7  cm   _________________________________________________________   Estimated total number of nodules >/= 1 cm: 0   Number of spongiform  nodules >/=  2 cm not described below (TR1): 0   Number of mixed cystic and solid nodules >/= 1.5 cm not described below (TR2): 0   _________________________________________________________   No discrete nodules are seen within the thyroid gland. Normal vascularity on color Doppler imaging.   IMPRESSION: Mildly enlarged and heterogeneous thyroid gland most consistent with thyroiditis.   No discrete thyroid nodules are evident. No significant hypervascularity.     Electronically Signed   By: Malachy Moan M.D.   On: 05/31/2022 16:20     Latest Reference Range & Units 10/31/12 15:13 02/13/18 09:52 05/16/22 14:29 07/13/22 08:54 08/27/22 15:52 12/05/22 15:23  TSH 0.450 - 4.500 uIU/mL  3.270 14.300 (H) 6.380 (H) 2.600 2.500  Thyroid Stimulating Horm uIU/mL 2.03       Triiodothyronine,Free,Serum 2.0 - 4.4 pg/mL   2.3 3.4 2.8 2.9  T4,Free(Direct) 0.82 - 1.77 ng/dL   9.38 (L) 1.01 7.51 0.25  Free Thyroxine 0.76 - 1.46 ng/dL 8.52       Thyroperoxidase Ab SerPl-aCnc 0 - 34 IU/mL   >600 (H)     Thyroglobulin Antibody 0.0 - 0.9 IU/mL   38.9 (H)     (H): Data is abnormally high (L): Data is abnormally low    Assessment & Plan:   1. Abnormal TSH 2. Positive thyroid antibodies  she is being seen at a kind request of Avis Epley, PA-C.  Her recent thyroid labs show positive antibodies, indicating autoimmune thyroid dysfunction.    Labs are leaning more towards under-activity.  We discussed trialing low dose Levothyroxine at 25 mcg po daily before breakfast.  We discussed symptoms of over-replacement to watch for.  Will recheck labs in 8 weeks and I will call her with the results and plan moving forward.  -Her ultrasound showed mild enlargement with heterogeneous tissue, consistent with acute thyroiditis as well.  -She notes she has tried different hormonal methods of contraceptives but her body has not tolerated any of those.     -Patient is advised to maintain  close follow up with Avis Epley, PA-C for primary care needs.     I spent  36  minutes in the care of the patient today including review of labs from Thyroid Function, CMP, and other relevant labs ; imaging/biopsy records (current and previous including abstractions from other facilities); face-to-face time discussing  her lab results and symptoms, medications doses, her options of short and long term treatment based on the latest standards of care / guidelines;   and documenting the encounter.  Nicole Freeman  participated in the discussions, expressed understanding, and voiced agreement with the above plans.  All questions were answered to her satisfaction. she is encouraged to contact clinic should she have any questions or concerns prior to her return visit.  Follow up plan: Return repeat thyroid labs in 2 months; will call with results and plan moving forward..   Thank you for involving me in the care of this pleasant patient, and I will continue to update you with her progress.   Ronny Bacon, [facility] [address] [place], [place] [address] Phone: [phone] Fax: [phone]  12/25/2022, 8:56 AM

## 2022-12-28 ENCOUNTER — Telehealth: Payer: Self-pay | Admitting: Nurse Practitioner

## 2022-12-28 ENCOUNTER — Telehealth: Payer: Self-pay

## 2022-12-28 ENCOUNTER — Ambulatory Visit
Admission: RE | Admit: 2022-12-28 | Discharge: 2022-12-28 | Disposition: A | Discharge status: Home / Self Care | Payer: 59 | Source: Ambulatory Visit | Attending: Nurse Practitioner | Admitting: Nurse Practitioner

## 2022-12-28 VITALS — BP 125/59 | HR 88 | Temp 97.9°F | Resp 18

## 2022-12-28 DIAGNOSIS — J029 Acute pharyngitis, unspecified: Secondary | ICD-10-CM | POA: Diagnosis present

## 2022-12-28 HISTORY — DX: Hypothyroidism, unspecified: E03.9

## 2022-12-28 LAB — POCT RAPID STREP A (OFFICE): Rapid Strep A Screen: NEGATIVE

## 2022-12-28 MED ORDER — NYSTATIN 100000 UNIT/ML MT SUSP: 5.0000 mL | Freq: Three times a day (TID) | OROMUCOSAL | 0 refills | Status: AC | PRN

## 2022-12-28 MED ORDER — NYSTATIN 100000 UNIT/ML MT SUSP: 5.0000 mL | Freq: Three times a day (TID) | OROMUCOSAL | 0 refills | Status: DC | PRN

## 2022-12-28 NOTE — ED Provider Notes (Signed)
[facility]    CSN: [id number] Arrival date & time: 12/28/22  1050      History   Chief Complaint Chief Complaint  Patient presents with   Sore Throat    Sore throat runny nose stuffy nose - Entered by patient    HPI Nicole Freeman is a 27 y.o. female.   The history is provided by the patient.   Patient presents for complaints of sore throat.  Reports sore throat initially started about 2 weeks ago.  Today, reports when she woke up, throat pain was worse, more so on the left side.  Patient denies fever, chills, headache, ear pain, nasal congestion, runny nose, cough, abdominal pain, or rash.  Patient reports she has not taken any medication for her symptoms.  Denies any obvious known sick contacts.  Past Medical History:  Diagnosis Date   Chiari malformation type I (HCC)    Headache    Thyroid activity decreased    Von Willebrand disease (HCC)     Patient Active Problem List   Diagnosis Date Noted   Nexplanon insertion 06/25/2022   Encounter for IUD removal 06/25/2022   Migraines 02/23/2021   Chiari malformation type I (HCC)    Allergic contact dermatitis 05/12/2018   Von Willebrand disease 05/28/2017    Past Surgical History:  Procedure Laterality Date   WISDOM TOOTH EXTRACTION      OB History     Gravida  3   Para  3   Term  3   Preterm      AB      Living  3      SAB      IAB      Ectopic      Multiple  0   Live Births  3            Home Medications    Prior to Admission medications   Medication Sig Start Date End Date Taking? Authorizing Provider  Drospirenone (SLYND) 4 MG TABS Take 1 tablet (4 mg total) by mouth daily. Patient not taking: Reported on 12/25/2022 12/11/22   Adline Potter, NP  levothyroxine (SYNTHROID) 25 MCG tablet Take 1 tablet (25 mcg total) by mouth daily. 12/25/22   Dani Gobble, NP  magic mouthwash (nystatin, hydrocortisone, diphenhydrAMINE, lidocaine) suspension Swish  and swallow 5 mLs 3 (three) times daily as needed for up to 7 days for mouth pain. 12/28/22 01/04/23  Leath-Warren, Sadie Haber, NP  ondansetron (ZOFRAN-ODT) 4 MG disintegrating tablet Take 1 tablet (4 mg total) by mouth every 8 (eight) hours as needed for nausea or vomiting. 10/17/21   Everlena Cooper, Adam R, DO  pantoprazole (PROTONIX) 40 MG tablet Take 1 tablet (40 mg total) by mouth daily. 10/01/22   Leath-Warren, Sadie Haber, NP  SUMAtriptan (IMITREX) 100 MG tablet Take 100 mg by mouth as needed. 05/10/22   [provider]  tiZANidine (ZANAFLEX) 4 MG tablet Take 1 tablet (4 mg total) by mouth every 8 (eight) hours as needed for muscle spasms. Do not take with alcohol or while driving or operating heavy machinery.  May cause drowsiness. 06/06/22   Valentino Nose, NP    Family History Family History  Problem Relation Age of Onset   Other Maternal Grandmother        malformed kidney   Diabetes Maternal Aunt    Breast cancer Maternal Aunt    Breast cancer Paternal Aunt    Lung cancer Paternal Aunt  Social History Social History   Tobacco Use   Smoking status: Former    Current packs/day: 0.50    Types: Cigarettes   Smokeless tobacco: Never   Tobacco comments:    quit April 2019  Vaping Use   Vaping status: Never Used  Substance Use Topics   Alcohol use: Yes    Comment: once in a blue moon   Drug use: No     Allergies   Banana   Review of Systems Review of Systems Per HPI  Physical Exam Triage Vital Signs ED Triage Vitals  Encounter Vitals Group     BP 12/28/22 1058 (!) 125/59     Systolic BP Percentile --      Diastolic BP Percentile --      Pulse Rate 12/28/22 1058 88     Resp 12/28/22 1058 18     Temp 12/28/22 1058 97.9 F (36.6 C)     Temp Source 12/28/22 1058 Oral     SpO2 12/28/22 1058 97 %     Weight --      Height --      Head Circumference --      Peak Flow --      Pain Score 12/28/22 1100 2     Pain Loc --      Pain Education --      Exclude  from Growth Chart --    No data found.  Updated Vital Signs BP (!) 125/59 (BP Location: Right Arm)   Pulse 88   Temp 97.9 F (36.6 C) (Oral)   Resp 18   LMP 12/13/2022 (Approximate)   SpO2 97%   Visual Acuity Right Eye Distance:   Left Eye Distance:   Bilateral Distance:    Right Eye Near:   Left Eye Near:    Bilateral Near:     Physical Exam Vitals and nursing note reviewed.  Constitutional:      General: She is not in acute distress.    Appearance: She is well-developed.  HENT:     Head: Normocephalic.     Right Ear: Tympanic membrane, ear canal and external ear normal.     Left Ear: Tympanic membrane, ear canal and external ear normal.     Nose: Nose normal. No congestion.     Mouth/Throat:     Mouth: Mucous membranes are moist.     Pharynx: Posterior oropharyngeal erythema present. No pharyngeal swelling.     Tonsils: No tonsillar exudate.     Comments: Cobblestoning present to posterior oropharynx  Eyes:     Conjunctiva/sclera: Conjunctivae normal.     Pupils: Pupils are equal, round, and reactive to light.  Cardiovascular:     Rate and Rhythm: Normal rate and regular rhythm.     Pulses: Normal pulses.     Heart sounds: Normal heart sounds.  Pulmonary:     Effort: Pulmonary effort is normal. No respiratory distress.     Breath sounds: Normal breath sounds. No stridor. No wheezing, rhonchi or rales.  Abdominal:     General: Bowel sounds are normal.     Palpations: Abdomen is soft.     Tenderness: There is no abdominal tenderness.  Musculoskeletal:     Cervical back: Normal range of motion.  Lymphadenopathy:     Cervical: No cervical adenopathy.  Skin:    General: Skin is warm and dry.  Neurological:     General: No focal deficit present.     Mental Status: She is alert and  oriented to person, place, and time.  Psychiatric:        Mood and Affect: Mood normal.        Behavior: Behavior normal.     UC Treatments / Results  Labs (all labs ordered  are listed, but only abnormal results are displayed) Labs Reviewed  CULTURE, GROUP A STREP [facility])  POCT RAPID STREP A (OFFICE)    EKG   Radiology No results found.  Procedures Procedures (including critical care time)  Medications Ordered in UC Medications - No data to display  Initial Impression / Assessment and Plan / UC Course  I have reviewed the triage vital signs and the nursing notes.  Pertinent labs & imaging results that were available during my care of the patient were reviewed by me and considered in my medical decision making (see chart for details).  The rapid strep test was negative.  Throat culture is pending.  Suspect viral etiology of sore throat at this time.  Magic mouthwash prescribed for throat pain.  Supportive care recommendations were provided and discussed with the patient to include over-the-counter analgesics, warm salt water gargles, and beginning a daily allergy medication.  Patient was given indications when follow-up will be necessary.  Patient was in agreement with this plan of care and verbalizes understanding.  All questions were answered.  Patient stable for discharge.   Final Clinical Impressions(s) / UC Diagnoses   Final diagnoses:  Sore throat     Discharge Instructions      Rapid strep test is negative, throat culture is pending.  You will be contacted if the pending test result is abnormal.  You also have access to the results via MyChart. Take medication as prescribed.  Also recommend beginning Zyrtec daily while symptoms persist. Increase fluids and allow for plenty of rest. Recommend Tylenol or Ibuprofen as needed for pain, fever, or general discomfort. Recommend throat lozenges, Chloraseptic or honey to help with throat pain. Warm salt water gargles 3-4 times daily to help with throat pain or discomfort. Recommend a diet with soft foods to include soups, broths, puddings, yogurt, Jell-O's, or popsicles until symptoms improve. May  use Chloraseptic throat spray or throat lozenges as needed for throat pain or discomfort. As discussed, if symptoms have not improved over the next several days, or if symptoms suddenly worsening, you may follow-up in this clinic or with your primary care physician for further evaluation. Follow-up as needed.     ED Prescriptions     Medication Sig Dispense Auth. Provider   magic mouthwash (nystatin, hydrocortisone, diphenhydrAMINE, lidocaine) suspension Swish and swallow 5 mLs 3 (three) times daily as needed for up to 7 days for mouth pain. 200 mL Leath-Warren, Sadie Haber, NP      PDMP not reviewed this encounter.   Abran Cantor, NP 12/28/22 [phone]

## 2022-12-28 NOTE — ED Triage Notes (Signed)
Pt reports sore throat that hurst to swallow x 2 weeks

## 2022-12-28 NOTE — Discharge Instructions (Addendum)
Rapid strep test is negative, throat culture is pending.  You will be contacted if the pending test result is abnormal.  You also have access to the results via MyChart. Take medication as prescribed.  Also recommend beginning Zyrtec daily while symptoms persist. Increase fluids and allow for plenty of rest. Recommend Tylenol or Ibuprofen as needed for pain, fever, or general discomfort. Recommend throat lozenges, Chloraseptic or honey to help with throat pain. Warm salt water gargles 3-4 times daily to help with throat pain or discomfort. Recommend a diet with soft foods to include soups, broths, puddings, yogurt, Jell-O's, or popsicles until symptoms improve. May use Chloraseptic throat spray or throat lozenges as needed for throat pain or discomfort. As discussed, if symptoms have not improved over the next several days, or if symptoms suddenly worsening, you may follow-up in this clinic or with your primary care physician for further evaluation. Follow-up as needed.

## 2022-12-28 NOTE — Telephone Encounter (Signed)
erroneous

## 2022-12-31 LAB — CULTURE, GROUP A STREP (THRC)

## 2023-01-21 ENCOUNTER — Ambulatory Visit
Admission: RE | Admit: 2023-01-21 | Discharge: 2023-01-21 | Disposition: A | Discharge status: Home / Self Care | Payer: 59 | Source: Ambulatory Visit | Attending: Nurse Practitioner | Admitting: Nurse Practitioner

## 2023-01-21 VITALS — BP 113/74 | HR 99 | Temp 98.5°F | Resp 16

## 2023-01-21 DIAGNOSIS — J069 Acute upper respiratory infection, unspecified: Secondary | ICD-10-CM

## 2023-01-21 LAB — POC COVID19/FLU A&B COMBO
Covid Antigen, POC: NEGATIVE
Influenza A Antigen, POC: NEGATIVE
Influenza B Antigen, POC: NEGATIVE

## 2023-01-21 MED ORDER — BENZONATATE 100 MG PO CAPS: 100.0000 mg | ORAL_CAPSULE | Freq: Three times a day (TID) | ORAL | 0 refills | Status: DC | PRN

## 2023-01-21 NOTE — Discharge Instructions (Signed)
You have a viral upper respiratory infection.  Symptoms should improve over the next week to 10 days.  If you develop chest pain or shortness of breath, go to the emergency room.  COVID-19 and influenza test are negative.    Some things that can make you feel better are: - Increased rest - Increasing fluid with water/sugar free electrolytes - Acetaminophen and ibuprofen as needed for fever/pain - Salt water gargling, chloraseptic spray and throat lozenges - OTC guaifenesin (Mucinex) 600 mg twice daily for congestion - Saline sinus flushes or a neti pot - Humidifying the air -Tessalon Perles every 8 hours as needed for dry cough   Start using albuterol inhaler before bed and as needed for wheezing

## 2023-01-21 NOTE — ED Provider Notes (Signed)
[facility]    CSN: [id number] Arrival date & time: 01/21/23  1434      History   Chief Complaint Chief Complaint  Patient presents with   Cough    Coughing, chest congestion, stuffy nose at night, fever 2 nights ago 99.8 but haven't had one since. Headache. - Entered by patient    HPI Nicole Freeman is a 27 y.o. female.   Patient presents today with 2-day history of upper body aches/soreness from coughing so much, chills and night sweats, congested cough, wheezing worse at nighttime when she lays flat, chest tightness when she coughs, chest congestion, stuffy nose, sore throat that is now improved, left ear pressure without change in hearing or drainage, nausea without vomiting, and fatigue.  She denies known fevers, shortness of breath, chest pain at rest, runny nose, headache, vomiting, diarrhea, and change in appetite.  Has been taking Zyrtec, cough drops, and Tylenol sinus for symptoms without much improvement.  No known sick contacts.  Patient reports history of asthma, usually uses inhaler when she gets sick or when she is around pet dander.  Has not needed to use asthma inhaler since symptoms started.    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 Authorizing Provider  benzonatate (TESSALON) 100 MG capsule Take 1 capsule (100 mg total) by mouth 3 (three) times daily as needed for cough. Do not take with alcohol or while driving or operating heavy machinery.  May cause drowsiness. 01/21/23  Yes Valentino Nose, NP  levothyroxine (SYNTHROID) 25 MCG tablet Take 1 tablet (25 mcg total) by mouth daily. 12/25/22  Yes Reardon, Freddi Starr, NP  pantoprazole (PROTONIX) 40 MG tablet Take 1 tablet (40 mg total) by mouth daily. 10/01/22  Yes Leath-Warren, Sadie Haber, NP  Drospirenone (SLYND) 4 MG TABS Take 1 tablet (4 mg total) by mouth daily. Patient not taking: Reported on 12/25/2022 12/11/22   Cyril Mourning A, NP  ondansetron (ZOFRAN-ODT) 4 MG disintegrating tablet Take 1 tablet (4 mg total) by mouth every 8 (eight) hours as needed for nausea or vomiting. 10/17/21   Drema Dallas, DO  SUMAtriptan (IMITREX) 100 MG tablet Take 100 mg by mouth as needed. 05/10/22   [provider]  tiZANidine (ZANAFLEX) 4 MG tablet Take 1 tablet (4 mg total) by mouth every 8 (eight) hours as needed for muscle spasms. Do not take with alcohol  or while driving or operating heavy machinery.  May cause drowsiness. 06/06/22   Valentino Nose, NP    Family History Family History  Problem Relation Age of Onset   Other Maternal Grandmother        malformed kidney   Diabetes Maternal Aunt    Breast cancer Maternal Aunt    Breast cancer Paternal Aunt    Lung cancer Paternal Aunt     Social History Social History   Tobacco Use   Smoking status: Former    Current packs/day: 0.50    Types: Cigarettes   Smokeless tobacco: Never   Tobacco comments:    quit April 2019  Vaping Use   Vaping status: Never Used  Substance Use Topics   Alcohol use: Yes    Comment: once in a blue moon   Drug use: No     Allergies   Banana   Review of Systems Review of Systems Per HPI  Physical Exam Triage Vital Signs ED  Triage Vitals  Encounter Vitals Group     BP 01/21/23 1512 113/74     Systolic BP Percentile --      Diastolic BP Percentile --      Pulse Rate 01/21/23 1512 99     Resp 01/21/23 1512 16     Temp 01/21/23 1512 98.5 F (36.9 C)     Temp Source 01/21/23 1512 Oral     SpO2 01/21/23 1512 97 %     Weight --      Height --      Head Circumference --      Peak Flow --      Pain Score 01/21/23 1517 0     Pain Loc --      Pain Education --      Exclude from Growth Chart --    No data found.  Updated Vital Signs BP 113/74 (BP Location: Right Arm)   Pulse 99   Temp 98.5 F (36.9 C) (Oral)   Resp 16   LMP 01/20/2023 (Approximate)   SpO2 97%   Breastfeeding No   Visual Acuity Right Eye Distance:   Left Eye Distance:   Bilateral Distance:    Right Eye Near:   Left Eye Near:    Bilateral Near:     Physical Exam Vitals and nursing note reviewed.  Constitutional:      General: She is not in acute distress.    Appearance: Normal appearance. She is not ill-appearing or toxic-appearing.  HENT:     Head: Normocephalic and atraumatic.     Right Ear: Tympanic membrane, ear canal and external ear normal.     Left Ear: Tympanic membrane, ear canal and external ear normal.     Nose: Congestion present. No rhinorrhea.     Mouth/Throat:     Mouth: Mucous membranes are moist.     Pharynx: Oropharynx is clear. No oropharyngeal exudate or posterior oropharyngeal erythema.  Eyes:     General: No scleral icterus.    Extraocular Movements: Extraocular movements intact.  Cardiovascular:     Rate and Rhythm: Normal rate and regular rhythm.  Pulmonary:     Effort: Pulmonary effort is normal. No respiratory distress.     Breath sounds: Normal breath sounds. No wheezing, rhonchi or rales.  Musculoskeletal:     Cervical back: Normal range of motion and neck supple.  Lymphadenopathy:     Cervical: No cervical adenopathy.  Skin:    General: Skin is warm and dry.  Coloration: Skin is not  jaundiced or pale.     Findings: No erythema or rash.  Neurological:     Mental Status: She is alert and oriented to person, place, and time.  Psychiatric:        Behavior: Behavior is cooperative.      UC Treatments / Results  Labs (all labs ordered are listed, but only abnormal results are displayed) Labs Reviewed  POC COVID19/FLU A&B COMBO    EKG   Radiology No results found.  Procedures Procedures (including critical care time)  Medications Ordered in UC Medications - No data to display  Initial Impression / Assessment and Plan / UC Course  I have reviewed the triage vital signs and the nursing notes.  Pertinent labs & imaging results that were available during my care of the patient were reviewed by me and considered in my medical decision making (see chart for details).   Patient is well-appearing, normotensive, afebrile, not tachycardic, not tachypneic, oxygenating well on room air.    1. Viral URI with cough Suspect viral etiology COVID-19 and influenza test negative Supportive care discussed with patient, start cough suppressant medication ER and return precautions discussed  The patient was given the opportunity to ask questions.  All questions answered to their satisfaction.  The patient is in agreement to this plan.   Final Clinical Impressions(s) / UC Diagnoses   Final diagnoses:  Viral URI with cough     Discharge Instructions      You have a viral upper respiratory infection.  Symptoms should improve over the next week to 10 days.  If you develop chest pain or shortness of breath, go to the emergency room.  COVID-19 and influenza test are negative.    Some things that can make you feel better are: - Increased rest - Increasing fluid with water/sugar free electrolytes - Acetaminophen and ibuprofen as needed for fever/pain - Salt water gargling, chloraseptic spray and throat lozenges - OTC guaifenesin (Mucinex) 600 mg twice daily for  congestion - Saline sinus flushes or a neti pot - Humidifying the air -Tessalon Perles every 8 hours as needed for dry cough   Start using albuterol inhaler before bed and as needed for wheezing     ED Prescriptions     Medication Sig Dispense Auth. Provider   benzonatate (TESSALON) 100 MG capsule Take 1 capsule (100 mg total) by mouth 3 (three) times daily as needed for cough. Do not take with alcohol or while driving or operating heavy machinery.  May cause drowsiness. 30 capsule Valentino Nose, NP      PDMP not reviewed this encounter.   Valentino Nose, NP 01/21/23 [phone]

## 2023-01-21 NOTE — ED Triage Notes (Signed)
Congestion, left ear fullness, chest tightness, cough with green flem x 2 days. Taking tylenol and tylenol sinus and headache, zyrtec.

## 2023-02-19 ENCOUNTER — Ambulatory Visit: Payer: 59 | Admitting: Adult Health

## 2023-02-21 ENCOUNTER — Ambulatory Visit
Admission: RE | Admit: 2023-02-21 | Discharge: 2023-02-21 | Disposition: A | Discharge status: Home / Self Care | Payer: 59 | Source: Ambulatory Visit | Attending: Family Medicine | Admitting: Family Medicine

## 2023-02-21 ENCOUNTER — Other Ambulatory Visit: Payer: Self-pay

## 2023-02-21 VITALS — BP 142/83 | HR 79 | Temp 98.5°F | Resp 18

## 2023-02-21 DIAGNOSIS — M79601 Pain in right arm: Secondary | ICD-10-CM | POA: Diagnosis not present

## 2023-02-21 DIAGNOSIS — R1011 Right upper quadrant pain: Secondary | ICD-10-CM

## 2023-02-21 HISTORY — DX: Autoimmune thyroiditis: E06.3

## 2023-02-21 MED ORDER — TIZANIDINE HCL 4 MG PO CAPS: 4.0000 mg | ORAL_CAPSULE | Freq: Three times a day (TID) | ORAL | 0 refills | Status: DC | PRN

## 2023-02-21 NOTE — ED Triage Notes (Signed)
Pt reports RUQ pain with intermittent radiation to right shoulder and right hand since last night. Pt reports intermittent RUE tingling and numbness last night. Reports pain since this am is now in RLQ of abdomen. Last BM this am. LMP 02/11/2023

## 2023-02-24 ENCOUNTER — Other Ambulatory Visit: Payer: Self-pay | Admitting: Nurse Practitioner

## 2023-02-24 ENCOUNTER — Other Ambulatory Visit: Payer: Self-pay

## 2023-02-24 ENCOUNTER — Emergency Department (HOSPITAL_COMMUNITY)
Admission: EM | Admit: 2023-02-24 | Discharge: 2023-02-25 | Disposition: A | Discharge status: Home / Self Care | Payer: 59 | Attending: Emergency Medicine | Admitting: Emergency Medicine

## 2023-02-24 ENCOUNTER — Encounter (HOSPITAL_COMMUNITY): Payer: Self-pay

## 2023-02-24 ENCOUNTER — Emergency Department (HOSPITAL_COMMUNITY): Payer: 59

## 2023-02-24 DIAGNOSIS — R112 Nausea with vomiting, unspecified: Secondary | ICD-10-CM | POA: Insufficient documentation

## 2023-02-24 DIAGNOSIS — R1011 Right upper quadrant pain: Secondary | ICD-10-CM | POA: Insufficient documentation

## 2023-02-24 LAB — COMPREHENSIVE METABOLIC PANEL
ALT: 12 U/L (ref 0–44)
AST: 18 U/L (ref 15–41)
Albumin: 4.5 g/dL (ref 3.5–5.0)
Alkaline Phosphatase: 38 U/L (ref 38–126)
Anion gap: 7 (ref 5–15)
BUN: 18 mg/dL (ref 6–20)
CO2: 21 mmol/L — ABNORMAL LOW (ref 22–32)
Calcium: 8.8 mg/dL — ABNORMAL LOW (ref 8.9–10.3)
Chloride: 108 mmol/L (ref 98–111)
Creatinine, Ser: 0.78 mg/dL (ref 0.44–1.00)
GFR, Estimated: >60 mL/min (ref >60)
Glucose, Bld: 101 mg/dL — ABNORMAL HIGH (ref 70–99)
Potassium: 3.7 mmol/L (ref 3.5–5.1)
Sodium: 136 mmol/L (ref 135–145)
Total Bilirubin: 0.7 mg/dL (ref 0.0–1.2)
Total Protein: 7.8 g/dL (ref 6.5–8.1)

## 2023-02-24 LAB — LIPASE, BLOOD: Lipase: 36 U/L (ref 11–51)

## 2023-02-24 LAB — URINALYSIS, ROUTINE W REFLEX MICROSCOPIC
Bilirubin Urine: NEGATIVE
Glucose, UA: NEGATIVE mg/dL
Hgb urine dipstick: NEGATIVE
Ketones, ur: NEGATIVE mg/dL
Leukocytes,Ua: NEGATIVE
Nitrite: NEGATIVE
Protein, ur: 30 mg/dL — AB
Specific Gravity, Urine: 1.023 (ref 1.005–1.030)
pH: 5 (ref 5.0–8.0)

## 2023-02-24 LAB — CBC
HCT: 42.6 % (ref 36.0–46.0)
Hemoglobin: 14.2 g/dL (ref 12.0–15.0)
MCH: 30 pg (ref 26.0–34.0)
MCHC: 33.3 g/dL (ref 30.0–36.0)
MCV: 90.1 fL (ref 80.0–100.0)
Platelets: 315 10*3/uL (ref 150–400)
RBC: 4.73 MIL/uL (ref 3.87–5.11)
RDW: 13 % (ref 11.5–15.5)
WBC: 11.9 10*3/uL — ABNORMAL HIGH (ref 4.0–10.5)
nRBC: 0 % (ref 0.0–0.2)

## 2023-02-24 LAB — POC URINE PREG, ED: Preg Test, Ur: NEGATIVE

## 2023-02-24 LAB — MAGNESIUM: Magnesium: 1.7 mg/dL (ref 1.7–2.4)

## 2023-02-24 MED ORDER — IOHEXOL 300 MG/ML  SOLN
100.0000 mL | Freq: Once | INTRAMUSCULAR | Status: AC | PRN
  Administered 2023-02-24: 100 mL via INTRAVENOUS

## 2023-02-24 MED ORDER — HYDROMORPHONE HCL 1 MG/ML IJ SOLN
0.5000 mg | Freq: Once | INTRAMUSCULAR | Status: AC
  Administered 2023-02-24: 0.5 mg via INTRAVENOUS
  Filled 2023-02-24: qty 0.5

## 2023-02-24 MED ORDER — LACTATED RINGERS IV BOLUS
1000.0000 mL | Freq: Once | INTRAVENOUS | Status: AC
  Administered 2023-02-24: 1000 mL via INTRAVENOUS

## 2023-02-24 MED ORDER — ONDANSETRON 4 MG PO TBDP: 4.0000 mg | ORAL_TABLET | Freq: Three times a day (TID) | ORAL | 0 refills | Status: DC | PRN

## 2023-02-24 MED ORDER — FAMOTIDINE IN NACL 20-0.9 MG/50ML-% IV SOLN
20.0000 mg | Freq: Once | INTRAVENOUS | Status: AC
  Administered 2023-02-24: 20 mg via INTRAVENOUS
  Filled 2023-02-24: qty 50

## 2023-02-24 MED ORDER — LIDOCAINE VISCOUS HCL 2 % MT SOLN
15.0000 mL | Freq: Once | OROMUCOSAL | Status: AC
  Administered 2023-02-24: 15 mL via ORAL
  Filled 2023-02-24: qty 15

## 2023-02-24 MED ORDER — ALUM & MAG HYDROXIDE-SIMETH 200-200-20 MG/5ML PO SUSP
30.0000 mL | Freq: Once | ORAL | Status: AC
  Administered 2023-02-24: 30 mL via ORAL
  Filled 2023-02-24: qty 30

## 2023-02-24 MED ORDER — ONDANSETRON HCL 4 MG/2ML IJ SOLN
4.0000 mg | Freq: Once | INTRAMUSCULAR | Status: AC | PRN
  Administered 2023-02-24: 4 mg via INTRAVENOUS
  Filled 2023-02-24: qty 2

## 2023-02-24 NOTE — ED Provider Notes (Signed)
[place] EMERGENCY DEPARTMENT AT [facility] Provider Note   CSN: [id number] Arrival date & time: 02/24/23  5573     History  Chief Complaint  Patient presents with   Abdominal Pain    Nicole Freeman is a 28 y.o. female.   Abdominal Pain Associated symptoms: nausea and vomiting   Patient presents for right upper quadrant abdominal pain.  Medical history includes von Willebrand disease, Hashimoto's disease, Chiari malformation type I, migraine headaches.  She has had right upper quadrant pain intermittently over the past several months.  She does feel that it is worsened postprandially.  Typically, it will go away within a day or 2.  She was seen at urgent care 3 days ago for similar symptoms.  At the time, she had some radiations into her right arm.  Per chart review, she was prescribed tizanidine.  Earlier today, she had some mild abdominal soreness in right upper quadrant.  She last ate at around 1 PM.  At approximately 4:30 PM, she had onset of severe right upper quadrant abdominal pain.  With this, she had associated nausea and vomiting.  She has not vomited since she arrived in the ED.  She has continued nausea and severe pain.  She denies any history of abdominal surgeries.  LMP was earlier this month.  Last bowel movement was today.     Home Medications Prior to Admission medications   Medication Sig Start Date End Date Taking? Authorizing Provider  levothyroxine (SYNTHROID) 25 MCG tablet Take 1 tablet (25 mcg total) by mouth daily. 12/25/22  Yes Reardon, Freddi Starr, NP  ondansetron (ZOFRAN-ODT) 4 MG disintegrating tablet Take 1 tablet (4 mg total) by mouth every 8 (eight) hours as needed for nausea or vomiting. 02/24/23  Yes Gloris Manchester, MD  pantoprazole (PROTONIX) 40 MG tablet Take 1 tablet (40 mg total) by mouth daily. Patient taking differently: Take 40 mg by mouth at bedtime. 10/01/22  Yes Leath-Warren, Sadie Haber, NP  tiZANidine (ZANAFLEX) 4 MG  capsule Take 1 capsule (4 mg total) by mouth 3 (three) times daily as needed for muscle spasms. Do not drink alcohol or drive while taking this medication. May cause drowsiness 02/21/23  Yes Particia Nearing, PA-C      Allergies    Banana    Review of Systems   Review of Systems  Gastrointestinal:  Positive for abdominal pain, nausea and vomiting.  All other systems reviewed and are negative.   Physical Exam Updated Vital Signs BP 107/65   Pulse (!) 104   Temp 98.3 F (36.8 C) (Oral)   Resp 20   Ht 5\' 2"  (1.575 m)   Wt 81.6 kg   LMP 02/11/2023 (Approximate)   SpO2 100%   BMI 32.92 kg/m  Physical Exam Vitals and nursing note reviewed.  Constitutional:      General: She is not in acute distress.    Appearance: She is well-developed. She is not ill-appearing, toxic-appearing or diaphoretic.  HENT:     Head: Normocephalic and atraumatic.     Mouth/Throat:     Mouth: Mucous membranes are moist.  Eyes:     General: No scleral icterus.    Extraocular Movements: Extraocular movements intact.     Conjunctiva/sclera: Conjunctivae normal.  Cardiovascular:     Rate and Rhythm: Normal rate and regular rhythm.  Pulmonary:     Effort: Pulmonary effort is normal. No respiratory distress.  Abdominal:     Palpations: Abdomen is soft.  Tenderness: There is abdominal tenderness in the right upper quadrant. There is no guarding or rebound.  Musculoskeletal:        General: No swelling.     Cervical back: Neck supple.  Skin:    General: Skin is warm and dry.     Coloration: Skin is not jaundiced or pale.  Neurological:     General: No focal deficit present.     Mental Status: She is alert and oriented to person, place, and time.  Psychiatric:        Mood and Affect: Mood normal.        Behavior: Behavior normal.     ED Results / Procedures / Treatments   Labs (all labs ordered are listed, but only abnormal results are displayed) Labs Reviewed  COMPREHENSIVE METABOLIC  PANEL - Abnormal; Notable for the following components:      Result Value   CO2 21 (*)    Glucose, Bld 101 (*)    Calcium 8.8 (*)    All other components within normal limits  CBC - Abnormal; Notable for the following components:   WBC 11.9 (*)    All other components within normal limits  URINALYSIS, ROUTINE W REFLEX MICROSCOPIC - Abnormal; Notable for the following components:   APPearance HAZY (*)    Protein, ur 30 (*)    Bacteria, UA RARE (*)    All other components within normal limits  LIPASE, BLOOD  MAGNESIUM  POC URINE PREG, ED    EKG None  Radiology CT ABDOMEN PELVIS W CONTRAST Result Date: 02/24/2023 CLINICAL DATA:  Abdominal pain, acute, non localized. Midepigastric pain with nausea, vomiting, and diarrhea. EXAM: CT ABDOMEN AND PELVIS WITH CONTRAST TECHNIQUE: Multidetector CT imaging of the abdomen and pelvis was performed using the standard protocol following bolus administration of intravenous contrast. RADIATION DOSE REDUCTION: This exam was performed according to the departmental dose-optimization program which includes automated exposure control, adjustment of the mA and/or kV according to patient size and/or use of iterative reconstruction technique. CONTRAST:  OMNIPAQUE IOHEXOL 300 MG/ML  SOLN COMPARISON:  02/21/2019. FINDINGS: Lower chest: There is a 6 mm nodule in the right middle lobe, axial image 6. Hepatobiliary: No focal liver abnormality is seen. No gallstones, gallbladder wall thickening, or biliary dilatation. Pancreas: Unremarkable. No pancreatic ductal dilatation or surrounding inflammatory changes. Spleen: Normal in size without focal abnormality. Adrenals/Urinary Tract: The adrenal glands are within normal limits. The kidneys enhance symmetrically. No renal calculus or hydronephrosis bilaterally. The bladder is unremarkable. Stomach/Bowel: There is a moderate hiatal hernia. Stomach is otherwise within normal limits. No bowel obstruction, free air, or  pneumatosis. A normal appendix is seen in the right lower quadrant. Vascular/Lymphatic: No significant vascular findings are present. No enlarged abdominal or pelvic lymph nodes. Reproductive: The uterus is within normal limits. Cystic structures are noted in the right adnexa measuring up to 2.7 cm, likely dominant follicles. No adnexal mass on the left. Other: No abdominopelvic ascites. Fat containing umbilical hernia is noted. Musculoskeletal: No acute osseous abnormality. IMPRESSION: 1. No acute intra-abdominal process. 2. Moderate hiatal hernia. Electronically Signed   By: Thornell Sartorius M.D.   On: 02/24/2023 23:02    Procedures Procedures    Medications Ordered in ED Medications  ondansetron [facility]) injection 4 mg (4 mg Intravenous Given 02/24/23 2010)  lactated ringers bolus 1,000 mL (0 mLs Intravenous Stopped 02/24/23 2328)  HYDROmorphone (DILAUDID) injection 0.5 mg (0.5 mg Intravenous Given 02/24/23 2010)  iohexol (OMNIPAQUE) 300 MG/ML solution 100  mL (100 mLs Intravenous Contrast Given 02/24/23 2243)  alum & mag hydroxide-simeth (MAALOX/MYLANTA) 200-200-20 MG/5ML suspension 30 mL (30 mLs Oral Given 02/24/23 2331)    And  lidocaine (XYLOCAINE) 2 % viscous mouth solution 15 mL (15 mLs Oral Given 02/24/23 2331)  famotidine (PEPCID) IVPB 20 mg premix (20 mg Intravenous New Bag/Given 02/24/23 2331)    ED Course/ Medical Decision Making/ A&P                                 Medical Decision Making Amount and/or Complexity of Data Reviewed Labs: ordered. Radiology: ordered.  Risk OTC drugs. Prescription drug management.   This patient presents to the ED for concern of abdominal pain, this involves an extensive number of treatment options, and is a complaint that carries with it a high risk of complications and morbidity.  The differential diagnosis includes gastritis, GERD, cholecystitis, pancreatitis, SBO   Co morbidities that complicate the patient evaluation  von Willebrand  disease, Hashimoto's disease, Chiari malformation type I, migraine headaches   Additional history obtained:  Additional history obtained from N/A External records from outside source obtained and reviewed including EMR   Lab Tests:  I Ordered, and personally interpreted labs.  The pertinent results include: Normal kidney function, normal electrolytes, normal hepatobiliary enzymes, normal lipase.  Mild leukocytosis was present.   Imaging Studies ordered:  I ordered imaging studies including CT of abdomen and pelvis I independently visualized and interpreted imaging which showed moderate hiatal hernia; no acute findings I agree with the radiologist interpretation   Cardiac Monitoring: / EKG:  The patient was maintained on a cardiac monitor.  I personally viewed and interpreted the cardiac monitored which showed an underlying rhythm of: Sinus rhythm  Problem List / ED Course / Critical interventions / Medication management  Patient presenting for right upper quadrant pain, nausea, and vomiting.  Onset of severe symptoms was this afternoon at 4:30 PM.  She has had intermittent right upper quadrant pain in the past but never this severe.  On arrival in the ED, patient appears uncomfortable.  Right upper quadrant tenderness is present.  Lab work and CT imaging were ordered.  Dilaudid and Zofran ordered for symptomatic relief.  Given her fluid losses, IV fluids were ordered.  Patient's lab work was notable only for mild leukocytosis.  She underwent CT scanning of abdomen and pelvis which did not show any acute findings.  Notably, she has no gallstones or evidence of cholecystitis.  She does have a moderate hiatal hernia.  She was informed of this.  She was not aware that she had this.  She does state that she takes daily pantoprazole for reflux.  I suspect the patient's symptoms today have been secondary to reflux.  GI cocktail was ordered for ongoing symptomatic relief.  Her symptoms further  improved.  She was able to tolerate p.o. intake.  She was advised to continue her daily Protonix and take Maalox as needed.  She was given contact information for GI follow-up.  She was discharged in stable condition. I ordered medication including IV fluids for hydration; Zofran for nausea; Dilaudid and GI cocktail for analgesia Reevaluation of the patient after these medicines showed that the patient improved I have reviewed the patients home medicines and have made adjustments as needed   Social Determinants of Health:  Has PCP        Final Clinical Impression(s) / ED Diagnoses Final diagnoses:  Right upper quadrant abdominal pain    Rx / DC Orders ED Discharge Orders          Ordered    ondansetron (ZOFRAN-ODT) 4 MG disintegrating tablet  Every 8 hours PRN        02/24/23 2359              Gloris Manchester, MD 02/25/23 0002

## 2023-02-24 NOTE — ED Triage Notes (Signed)
Pt complaining of Mid epigastric pain that started on 1/16 but has gotten worse and now is complaining of N/V/D. Pt states that she has diarrhea when she vomits. Pt believes she has gall stones after going to UC and being told that "may be" her problem. No fever in triage

## 2023-02-25 NOTE — Discharge Instructions (Addendum)
Test results today are reassuring.  Your CT scan did not show any gallstones or evidence of gallbladder disease.  You do have a hiatal hernia.  This can worsen symptoms of reflux.  Continue daily Protonix.  Take over-the-counter Maalox/Mylanta as needed for upper abdominal pain.  A prescription for Zofran was sent to your pharmacy.  Take this as needed for nausea.  Call the telephone number below to set up a follow-up appointment with gastroenterology.  Return to the emergency department for any new or worsening symptoms of concern.

## 2023-02-25 NOTE — ED Provider Notes (Signed)
[facility]    CSN: [id number] Arrival date & time: 02/21/23  1005      History   Chief Complaint Chief Complaint  Patient presents with   Shoulder Pain    Upper Stomach pain last night then arm and shoulder felt like it was numb/tingling but also a sharp pain now I still have the arm/shoulder pain and my stomach is cramping bad on my lower right side. - Entered by patient   Abdominal Pain    HPI Nicole Freeman is a 28 y.o. female.   Patient presenting today with 1 day history of right upper quadrant pain sometimes radiating to the right shoulder and down her arm.  Denies fever, chills, nausea, vomiting, bowel changes, new foods or medications.  Last BM was this morning.  LMP 02/11/2023.  No known pertinent chronic medical problems per patient.    Past Medical History:  Diagnosis Date   Chiari malformation type I (HCC)    Hashimoto's disease    Headache    Thyroid activity decreased    Von Willebrand disease (HCC)     Patient Active Problem List   Diagnosis Date Noted   Nexplanon insertion 06/25/2022   Encounter for IUD removal 06/25/2022   Migraines 02/23/2021   Chiari malformation type I (HCC)    Allergic contact dermatitis 05/12/2018   Von Willebrand disease 05/28/2017    Past Surgical History:  Procedure Laterality Date   WISDOM TOOTH EXTRACTION      OB History     Gravida  3   Para  3   Term  3   Preterm      AB      Living  3      SAB      IAB      Ectopic      Multiple  0   Live Births  3            Home Medications    Prior to Admission medications   Medication Sig Start Date End Date Taking? Authorizing Provider  tiZANidine (ZANAFLEX) 4 MG capsule Take 1 capsule (4 mg total) by mouth 3 (three) times daily as needed for muscle spasms. Do not drink alcohol or drive while taking this medication. May cause drowsiness 02/21/23  Yes Particia Nearing, PA-C  levothyroxine (SYNTHROID) 25 MCG tablet Take  1 tablet (25 mcg total) by mouth daily. 12/25/22   Dani Gobble, NP  ondansetron (ZOFRAN-ODT) 4 MG disintegrating tablet Take 1 tablet (4 mg total) by mouth every 8 (eight) hours as needed for nausea or vomiting. 02/24/23   Gloris Manchester, MD  pantoprazole (PROTONIX) 40 MG tablet Take 1 tablet (40 mg total) by mouth daily. Patient taking differently: Take 40 mg by mouth at bedtime. 10/01/22   Leath-Warren, Sadie Haber, NP    Family History Family History  Problem Relation Age of Onset   Other Maternal Grandmother        malformed kidney   Diabetes Maternal Aunt    Breast cancer Maternal Aunt    Breast cancer Paternal Aunt    Lung cancer Paternal Aunt     Social History Social History   Tobacco Use   Smoking status: Former    Current packs/day: 0.50    Types: Cigarettes   Smokeless tobacco: Never   Tobacco comments:    quit April 2019  Vaping Use   Vaping status: Never Used  Substance Use Topics   Alcohol use: Yes  Comment: once in a blue moon   Drug use: No     Allergies   Banana   Review of Systems Review of Systems Per HPI  Physical Exam Triage Vital Signs ED Triage Vitals  Encounter Vitals Group     BP 02/21/23 1018 (!) 142/83     Systolic BP Percentile --      Diastolic BP Percentile --      Pulse Rate 02/21/23 1018 79     Resp 02/21/23 1018 18     Temp 02/21/23 1018 98.5 F (36.9 C)     Temp Source 02/21/23 1018 Oral     SpO2 02/21/23 1018 98 %     Weight --      Height --      Head Circumference --      Peak Flow --      Pain Score 02/21/23 1016 3     Pain Loc --      Pain Education --      Exclude from Growth Chart --    No data found.  Updated Vital Signs BP (!) 142/83 (BP Location: Right Arm)   Pulse 79   Temp 98.5 F (36.9 C) (Oral)   Resp 18   LMP 02/11/2023 (Approximate)   SpO2 98%   Breastfeeding No   Visual Acuity Right Eye Distance:   Left Eye Distance:   Bilateral Distance:    Right Eye Near:   Left Eye Near:     Bilateral Near:     Physical Exam Vitals and nursing note reviewed.  Constitutional:      Appearance: Normal appearance. She is not ill-appearing.  HENT:     Head: Atraumatic.  Eyes:     Extraocular Movements: Extraocular movements intact.     Conjunctiva/sclera: Conjunctivae normal.  Cardiovascular:     Rate and Rhythm: Normal rate and regular rhythm.     Heart sounds: Normal heart sounds.  Pulmonary:     Effort: Pulmonary effort is normal.     Breath sounds: Normal breath sounds.  Abdominal:     General: Bowel sounds are normal. There is no distension.     Palpations: Abdomen is soft.     Tenderness: There is abdominal tenderness. There is no right CVA tenderness, left CVA tenderness or guarding.     Comments: Diffuse tenderness to palpation to the upper abdomen worse on the right.  Negative Murphy sign, negative rebound  Musculoskeletal:        General: Tenderness present. Normal range of motion.     Cervical back: Normal range of motion and neck supple.     Comments: Tenderness to palpation to right SCM and trapezius  Skin:    General: Skin is warm and dry.  Neurological:     Mental Status: She is alert and oriented to person, place, and time.  Psychiatric:        Mood and Affect: Mood normal.        Thought Content: Thought content normal.        Judgment: Judgment normal.      UC Treatments / Results  Labs (all labs ordered are listed, but only abnormal results are displayed) Labs Reviewed - No data to display  EKG   Radiology CT ABDOMEN PELVIS W CONTRAST Result Date: 02/24/2023 CLINICAL DATA:  Abdominal pain, acute, non localized. Midepigastric pain with nausea, vomiting, and diarrhea. EXAM: CT ABDOMEN AND PELVIS WITH CONTRAST TECHNIQUE: Multidetector CT imaging of the abdomen and pelvis was performed  using the standard protocol following bolus administration of intravenous contrast. RADIATION DOSE REDUCTION: This exam was performed according to the  departmental dose-optimization program which includes automated exposure control, adjustment of the mA and/or kV according to patient size and/or use of iterative reconstruction technique. CONTRAST:  OMNIPAQUE IOHEXOL 300 MG/ML  SOLN COMPARISON:  02/21/2019. FINDINGS: Lower chest: There is a 6 mm nodule in the right middle lobe, axial image 6. Hepatobiliary: No focal liver abnormality is seen. No gallstones, gallbladder wall thickening, or biliary dilatation. Pancreas: Unremarkable. No pancreatic ductal dilatation or surrounding inflammatory changes. Spleen: Normal in size without focal abnormality. Adrenals/Urinary Tract: The adrenal glands are within normal limits. The kidneys enhance symmetrically. No renal calculus or hydronephrosis bilaterally. The bladder is unremarkable. Stomach/Bowel: There is a moderate hiatal hernia. Stomach is otherwise within normal limits. No bowel obstruction, free air, or pneumatosis. A normal appendix is seen in the right lower quadrant. Vascular/Lymphatic: No significant vascular findings are present. No enlarged abdominal or pelvic lymph nodes. Reproductive: The uterus is within normal limits. Cystic structures are noted in the right adnexa measuring up to 2.7 cm, likely dominant follicles. No adnexal mass on the left. Other: No abdominopelvic ascites. Fat containing umbilical hernia is noted. Musculoskeletal: No acute osseous abnormality. IMPRESSION: 1. No acute intra-abdominal process. 2. Moderate hiatal hernia. Electronically Signed   By: Thornell Sartorius M.D.   On: 02/24/2023 23:02    Procedures Procedures (including critical care time)  Medications Ordered in UC Medications - No data to display  Initial Impression / Assessment and Plan / UC Course  I have reviewed the triage vital signs and the nursing notes.  Pertinent labs & imaging results that were available during my care of the patient were reviewed by me and considered in my medical decision making (see  chart for details).     Suspect arm pain is muscular in nature, treat with Zanaflex, heat, massage, stretches.  Unclear etiology of abdominal pain.  Discussed supportive measures at home, return precautions.  Follow-up with PCP for recheck, ED for significantly worsening symptoms.  Final Clinical Impressions(s) / UC Diagnoses   Final diagnoses:  RUQ pain  Right arm pain   Discharge Instructions   None    ED Prescriptions     Medication Sig Dispense Auth. Provider   tiZANidine (ZANAFLEX) 4 MG capsule Take 1 capsule (4 mg total) by mouth 3 (three) times daily as needed for muscle spasms. Do not drink alcohol or drive while taking this medication. May cause drowsiness 15 capsule Particia Nearing, [place]      PDMP not reviewed this encounter.   Particia Nearing, [place] 02/25/23 1825

## 2023-03-12 ENCOUNTER — Other Ambulatory Visit (INDEPENDENT_AMBULATORY_CARE_PROVIDER_SITE_OTHER): Payer: 59 | Admitting: *Deleted

## 2023-03-12 DIAGNOSIS — R350 Frequency of micturition: Secondary | ICD-10-CM

## 2023-03-12 DIAGNOSIS — R300 Dysuria: Secondary | ICD-10-CM

## 2023-03-12 LAB — POCT URINALYSIS DIPSTICK
Blood, UA: NEGATIVE
Glucose, UA: NEGATIVE
Ketones, UA: NEGATIVE
Leukocytes, UA: NEGATIVE
Nitrite, UA: NEGATIVE
Protein, UA: NEGATIVE

## 2023-03-12 NOTE — Progress Notes (Signed)
   NURSE VISIT- UTI SYMPTOMS   SUBJECTIVE:  Nicole Freeman is a 28 y.o. G74P3003 female here for UTI symptoms. She is a GYN patient. She reports urinary frequency and burning with urination .  OBJECTIVE:  LMP 02/11/2023 (Approximate)   Appears well, in no apparent distress  Results for orders placed or performed in visit on 03/12/23 (from the past 24 hours)  POCT Urinalysis Dipstick   Collection Time: 03/12/23  4:26 PM  Result Value Ref Range   Color, UA     Clarity, UA     Glucose, UA Negative Negative   Bilirubin, UA     Ketones, UA neg    Spec Grav, UA     Blood, UA neg    pH, UA     Protein, UA Negative Negative   Urobilinogen, UA     Nitrite, UA neg    Leukocytes, UA Negative Negative   Appearance     Odor      ASSESSMENT: GYN patient with UTI symptoms and negative nitrites  PLAN: Note routed to Delon Lewis, AGNP   Rx sent by provider today: No Urine culture sent Call or return to clinic prn if these symptoms worsen or fail to improve as anticipated. Follow-up: as needed   Nicole Freeman  03/12/2023 4:30 PM

## 2023-03-13 LAB — URINALYSIS, ROUTINE W REFLEX MICROSCOPIC
Bilirubin, UA: NEGATIVE
Glucose, UA: NEGATIVE
Ketones, UA: NEGATIVE
Nitrite, UA: NEGATIVE
Protein,UA: NEGATIVE
RBC, UA: NEGATIVE
Specific Gravity, UA: 1.019 (ref 1.005–1.030)
Urobilinogen, Ur: 0.2 mg/dL (ref 0.2–1.0)
pH, UA: 6.5 (ref 5.0–7.5)

## 2023-03-13 LAB — MICROSCOPIC EXAMINATION
Bacteria, UA: NONE SEEN
Casts: NONE SEEN /[LPF]
RBC, Urine: NONE SEEN /[HPF] (ref 0–2)
WBC, UA: >30 /[HPF] — AB (ref 0–5)

## 2023-03-15 ENCOUNTER — Other Ambulatory Visit: Payer: Self-pay | Admitting: Adult Health

## 2023-03-15 LAB — URINE CULTURE

## 2023-03-15 MED ORDER — NITROFURANTOIN MONOHYD MACRO 100 MG PO CAPS: 100.0000 mg | ORAL_CAPSULE | Freq: Two times a day (BID) | ORAL | 0 refills | Status: DC

## 2023-03-15 NOTE — Progress Notes (Signed)
Will rx macrobid  ?

## 2023-03-19 ENCOUNTER — Telehealth: Payer: Self-pay | Admitting: *Deleted

## 2023-03-19 ENCOUNTER — Encounter: Payer: Self-pay | Admitting: Nurse Practitioner

## 2023-03-19 DIAGNOSIS — E063 Autoimmune thyroiditis: Secondary | ICD-10-CM

## 2023-03-19 DIAGNOSIS — R768 Other specified abnormal immunological findings in serum: Secondary | ICD-10-CM

## 2023-03-19 LAB — T4, FREE: Free T4: 0.98 ng/dL (ref 0.82–1.77)

## 2023-03-19 LAB — TSH: TSH: 2.1 u[IU]/mL (ref 0.450–4.500)

## 2023-03-19 MED ORDER — LEVOTHYROXINE SODIUM 50 MCG PO TABS: 50.0000 ug | ORAL_TABLET | Freq: Every day | ORAL | 1 refills | Status: DC

## 2023-03-19 NOTE — Telephone Encounter (Signed)
See patient response.

## 2023-03-19 NOTE — Telephone Encounter (Signed)
-----   Message from Dani Gobble sent at 03/19/2023  7:48 AM EST ----- FYI: I sent mychart message going over recent thyroid labs.

## 2023-03-19 NOTE — Telephone Encounter (Signed)
Noted , Nicole Freeman has sent the patient a MyChart message letting her know about her thyroid results.

## 2023-03-19 NOTE — Progress Notes (Signed)
FYI: I sent mychart message going over recent thyroid labs

## 2023-05-02 ENCOUNTER — Ambulatory Visit: Admitting: Advanced Practice Midwife

## 2023-05-10 ENCOUNTER — Encounter: Payer: Self-pay | Admitting: Obstetrics & Gynecology

## 2023-05-10 ENCOUNTER — Other Ambulatory Visit (HOSPITAL_COMMUNITY)
Admission: RE | Admit: 2023-05-10 | Discharge: 2023-05-10 | Disposition: A | Discharge status: Home / Self Care | Source: Ambulatory Visit | Attending: Obstetrics & Gynecology | Admitting: Obstetrics & Gynecology

## 2023-05-10 ENCOUNTER — Ambulatory Visit: Admitting: Obstetrics & Gynecology

## 2023-05-10 VITALS — BP 132/72 | HR 88 | Ht 62.0 in | Wt 176.2 lb

## 2023-05-10 DIAGNOSIS — Z124 Encounter for screening for malignant neoplasm of cervix: Secondary | ICD-10-CM

## 2023-05-10 DIAGNOSIS — E039 Hypothyroidism, unspecified: Secondary | ICD-10-CM

## 2023-05-10 DIAGNOSIS — D6800 Von Willebrand disease, unspecified: Secondary | ICD-10-CM

## 2023-05-10 DIAGNOSIS — N809 Endometriosis, unspecified: Secondary | ICD-10-CM

## 2023-05-10 DIAGNOSIS — N939 Abnormal uterine and vaginal bleeding, unspecified: Secondary | ICD-10-CM

## 2023-05-10 DIAGNOSIS — N946 Dysmenorrhea, unspecified: Secondary | ICD-10-CM | POA: Diagnosis not present

## 2023-05-10 MED ORDER — ORILISSA 150 MG PO TABS: 1.0000 | ORAL_TABLET | Freq: Every day | ORAL | 11 refills | Status: AC

## 2023-05-10 NOTE — Progress Notes (Signed)
 GYN VISIT Patient name: KIERA HUSSEY MRN [id number]  Date of birth: [birth year] Chief Complaint:   Menstrual Problem  History of Present Illness:   FATIM VANDERSCHAAF is a 28 y.o. G39P3003 female being seen today for the following concerns:     Always had issues with irregular menses and considerable dysmenorrhea Had Nexplanon- worsening bleeding then nothing x 44yr.  Nex discontinued had pregnancy then had Liletta.  Liletta had light to no bleeding; however, noted worsening migraines and device was removed- symptoms resolved and also not sure if symptoms were due to thyroid issues.  Pt diagnosed with hypothyroidism.  Currently on medication and noted improvement of symptoms -Liletta removed 2021 then last pregnancy 2023.   After last baby, Mirena was placed, but then noted worsening migraines.  Tried Nexplanon- mood changes, irregular bleeding and other side effects.  Device removed and transitioned to [facility] and noted similar side effects (Nov 2024).  At this time, she is fearful of hormonal contraceptives.  Currently, menses pattern irregular: 1/7-1/11 then 1/23-2/1 2/17-21 3/15-18 On occasion will pass small clots at night- mostly during period, but sometimes intermenstrual.  Sometimes bleeding is very heavy and may go through an ultra-tampon in . Other times the bleeding will be very light. +dyspareuna, +dysmenorrhea- tries not to take OTC medication Sometimes struggles with placement of tampon due to improper placement Pain with bowel movement sometimes  Partner had vasectomy  -vWB disease- no issues in the past  Of note, she does think the hypothyroidism is contributing to her issues as over the past 2 weeks she has noted some decreased energy and fatigue.  She has plans to follow-up  Patient's last menstrual period was 04/20/2023.    Review of Systems:   Pertinent items are noted in HPI Denies fever/chills, dizziness, headaches, visual  disturbances, fatigue, shortness of breath, chest pain, abdominal pain, vomiting Pertinent History Reviewed:   Past Surgical History:  Procedure Laterality Date   WISDOM TOOTH EXTRACTION      Past Medical History:  Diagnosis Date   Chiari malformation type I (HCC)    Hashimoto's disease    Headache    Thyroid activity decreased    Von Willebrand disease (HCC)    Reviewed problem list, medications and allergies. Physical Assessment:   Vitals:   05/10/23 1114  BP: 132/72  Pulse: 88  Weight: 176 lb 3.2 oz (79.9 kg)  Height: 5\' 2"  (1.575 m)  Body mass index is 32.23 kg/m.       Physical Examination:   General appearance: alert, well appearing, and in no distress  Psych: mood appropriate, normal affect  Skin: warm & dry   Cardiovascular: normal heart rate noted  Respiratory: normal respiratory effort, no distress  Abdomen: soft, non-tender, no rebound, no guarding, no reproducible pain  Pelvic: VULVA: normal appearing vulva with no masses, tenderness or lesions, VAGINA: normal appearing vagina with normal color and discharge, no lesions, CERVIX: normal appearing cervix without discharge or lesions, UTERUS: uterus is normal size, shape, consistency and nontender, ADNEXA: normal adnexa in size, nontender and no masses  Extremities: no edema   Chaperone: Faith Rogue    Assessment & Plan:  1) AUB, dysmenorrhea-> suspected endometriosis - Discussed endometriosis and reviewed potential symptoms.  Also discussed gold standard of treatment as well as conservative management options - Discussed NSAIDs, hormonal contraception, LARCs.  Unfortunately due to her extended history of significant side effects with hormonal interventions patient hesitant to start any of these options -Discussed that while gold  standard of endometriosis diagnosis is diagnostic laparoscopy, initial step is typically medical management - Also discussed surgical intervention including robotic assisted  laparoscopic hysterectomy and bilateral salpingectomy.  Discussed ovarian preservation.  Reviewed risk benefits including but not limited to risk of bleeding, infection, injury requiring further surgical intervention.  Also discussed potential surgical complications including risk of open procedure, dehiscence or fistula formation. -Patient would prefer to avoid surgical intervention if possible - After much discussion we will plan for trial of Orilissa - Will also schedule pelvic ultrasound to rule out underlying etiology - Plan to follow-up in 3 to 4 months Should she note any side effects including worsening migraines or other symptoms patient to discontinue medication  Meds ordered this encounter  Medications   Elagolix Sodium (ORILISSA) 150 MG TABS    Sig: Take 1 tablet (150 mg total) by mouth daily.    Dispense:  30 tablet    Refill:  11   2) preventative screening - Pap smear collected today   Return in about 3 months (around 08/09/2023) for Medication follow up, with Dr. Charlotta Newton and pelvic US.   Myna Hidalgo, DO Attending Obstetrician & Gynecologist, [facility] for [employer], [facility]

## 2023-05-13 LAB — CYTOLOGY - PAP
Comment: NEGATIVE
Diagnosis: NEGATIVE
High risk HPV: NEGATIVE

## 2023-05-14 ENCOUNTER — Encounter: Payer: Self-pay | Admitting: Obstetrics & Gynecology

## 2023-05-20 ENCOUNTER — Encounter: Payer: Self-pay | Admitting: Obstetrics & Gynecology

## 2023-05-20 ENCOUNTER — Ambulatory Visit

## 2023-05-20 DIAGNOSIS — N946 Dysmenorrhea, unspecified: Secondary | ICD-10-CM | POA: Diagnosis not present

## 2023-05-20 DIAGNOSIS — N939 Abnormal uterine and vaginal bleeding, unspecified: Secondary | ICD-10-CM

## 2023-05-20 DIAGNOSIS — N809 Endometriosis, unspecified: Secondary | ICD-10-CM

## 2023-05-20 NOTE — Progress Notes (Signed)
 PELVIC TA/TV: retroverted homogeneous uterus,normal,EEC 11 mm,normal ovaries,unable to slide left ovary,right ovary appears to be mobile,no free fluid,left adnexal discomfort during ultrasound   Chaperone [facility]

## 2023-05-28 ENCOUNTER — Encounter: Payer: Self-pay | Admitting: Obstetrics & Gynecology

## 2023-06-09 ENCOUNTER — Encounter: Payer: Self-pay | Admitting: Obstetrics & Gynecology

## 2023-06-11 ENCOUNTER — Encounter: Payer: Self-pay | Admitting: Nurse Practitioner

## 2023-06-19 LAB — TSH: TSH: 1.11 u[IU]/mL (ref 0.450–4.500)

## 2023-06-19 LAB — T4, FREE: Free T4: 0.99 ng/dL (ref 0.82–1.77)

## 2023-06-20 ENCOUNTER — Ambulatory Visit: Admitting: Obstetrics & Gynecology

## 2023-06-20 ENCOUNTER — Encounter: Payer: Self-pay | Admitting: Obstetrics & Gynecology

## 2023-06-20 VITALS — BP 115/77 | HR 87 | Ht 62.0 in | Wt 177.0 lb

## 2023-06-20 DIAGNOSIS — Z01818 Encounter for other preprocedural examination: Secondary | ICD-10-CM | POA: Diagnosis not present

## 2023-06-20 DIAGNOSIS — D6800 Von Willebrand disease, unspecified: Secondary | ICD-10-CM

## 2023-06-20 DIAGNOSIS — N939 Abnormal uterine and vaginal bleeding, unspecified: Secondary | ICD-10-CM | POA: Diagnosis not present

## 2023-06-20 DIAGNOSIS — N946 Dysmenorrhea, unspecified: Secondary | ICD-10-CM | POA: Diagnosis not present

## 2023-06-20 NOTE — Progress Notes (Signed)
 GYN VISIT Patient name: Nicole Freeman MRN [id number]  Date of birth: 30-May-1995 Chief Complaint:   No chief complaint on file.  History of Present Illness:   Nicole Freeman is a 28 y.o. G65P3003 female being seen today to discuss the following:  PT wishes to discuss hysterectomy.  In review, she notes longstanding issue of irregular bleeding with significant dysmenorrhea.  Menses are regular sometimes 2 to 5 days with moderate bleeding.  Sometimes heavier than others periods + dyspareunia + dysmenorrhea with minimal improvement of over-the-counter medication .  She has tried medical regulation of her periods including Nexplanon  and IUDs.  While the Liletta  did help improve her bleeding, she noted worsening of her migraines and required removal of the device.  At her last visit she was trialed on Orilissa , again while this did seem to help improve her pelvic pain, she noted worsening migraines.  At this point she does not desire future pregnancy as her partner has had a vasectomy and she wishes to proceed with permanent surgical intervention.  Additionally she is fearful of the potential side effects of all hormonal contraception's as she has tried Slynd , Nexplanon  as well as Mirena  and Liletta .   Sometimes super heavy, sometimes super light.   LMP: 4/24-28 possible some improvement while on the Orilissa    No LMP recorded. (Menstrual status: Irregular Periods).    Review of Systems:   Pertinent items are noted in HPI Denies fever/chills, dizziness, headaches, visual disturbances, fatigue, shortness of breath, chest pain, abdominal pain, vomiting Pertinent History Reviewed:   Past Surgical History:  Procedure Laterality Date   WISDOM TOOTH EXTRACTION      Past Medical History:  Diagnosis Date   Chiari malformation type I (HCC)    Hashimoto's disease    Headache    Thyroid  activity decreased    Von Willebrand disease (HCC)    Reviewed problem list,  medications and allergies. Physical Assessment:   Vitals:   06/20/23 1423  BP: 115/77  Pulse: 87  Weight: 177 lb (80.3 kg)  Height: 5\' 2"  (1.575 m)  Body mass index is 32.37 kg/m.       Physical Examination:   General appearance: alert, well appearing, and in no distress  Psych: mood appropriate, normal affect  Skin: warm & dry   Cardiovascular: normal heart rate noted  Respiratory: normal respiratory effort, no distress  Abdomen: soft, non-tender, no rebound, no guarding  Pelvic: examination not indicated- previously completed  Extremities: no edema   Chaperone: N/A    US  completed: 05-20-2023: 6 x 4.6 x 5.9 cm total volume 86 cc retroverted.  Symmetrical in the atrium.  Normal ovaries bilaterally  Assessment & Plan:  1) AUB/Dysmenorrhea- suspected endometriosis - Patient failed medical management - Reviewed pelvic ultrasound that showed no abnormalities - Discussed alternative medical options including trial of IUD, Myfembree or giving Orilissa  a longer time. - Patient strongly desires to proceed with surgical intervention  -Discussed plan for robotic- assisted laparoscopic hysterectomy and bilateral salpingectomy  -Reviewed ovarian preservation -Explained that this surgery is performed to remove the uterus through several small incisions in the abdomen- pending anatomy 3-5 ports. I discussed the risks and benefits of the surgery, including, but not limited to risk of bleeding, including the need for blood transfusion, infection, damage to surrounding organs and tissues such as damage to bladder, ureter or bowel that would requiring additional procedures.  Reviewed long term complications such as fistula or dehiscence requiring further surgical intervention.  Discussed possible  need for conversion to an open procedure and potential for other complications that cannot be predicted or prevented including DVT, PE or death. -reviewed same day procedure -typical recovery 8-12 wks  with several weeks off work and 8wks of pelvic rest -questions/concerns were addressed and pt desires to proceed - Medications reviewed, patient is not taking GLP-1 or phentermine - Plan to schedule for July 8, surgical referral created   Orders Placed This Encounter  Procedures   Ambulatory Referral For Surgery Scheduling    Return for TBD.   Dariana Garbett, DO Attending Obstetrician & Gynecologist, [facility] for [employer], [facility]

## 2023-06-21 NOTE — Patient Instructions (Signed)

## 2023-06-25 ENCOUNTER — Ambulatory Visit (INDEPENDENT_AMBULATORY_CARE_PROVIDER_SITE_OTHER): Payer: 59 | Admitting: Nurse Practitioner

## 2023-06-25 ENCOUNTER — Encounter: Payer: Self-pay | Admitting: Nurse Practitioner

## 2023-06-25 VITALS — BP 116/70 | HR 84 | Ht 62.0 in | Wt 178.6 lb

## 2023-06-25 DIAGNOSIS — E063 Autoimmune thyroiditis: Secondary | ICD-10-CM

## 2023-06-25 DIAGNOSIS — R768 Other specified abnormal immunological findings in serum: Secondary | ICD-10-CM

## 2023-06-25 MED ORDER — LEVOTHYROXINE SODIUM 75 MCG PO TABS: 75.0000 ug | ORAL_TABLET | Freq: Every day | ORAL | 1 refills | Status: DC

## 2023-06-25 NOTE — Progress Notes (Signed)
 06/25/2023     Endocrinology Follow Up Note    Subjective:    Patient ID: Nicole Freeman, female    DOB: 1995-03-19, PCP Roxene Cora, PA-C.   Past Medical History:  Diagnosis Date   Chiari malformation type I (HCC)    Hashimoto's disease    Headache    Thyroid  activity decreased    Von Willebrand disease (HCC)     Past Surgical History:  Procedure Laterality Date   WISDOM TOOTH EXTRACTION      Social History   Socioeconomic History   Marital status: Married    Spouse name: Not on file   Number of children: 2   Years of education: Not on file   Highest education level: Not on file  Occupational History   Not on file  Tobacco Use   Smoking status: Former    Current packs/day: 0.50    Types: Cigarettes   Smokeless tobacco: Never   Tobacco comments:    quit April 2019  Vaping Use   Vaping status: Never Used  Substance and Sexual Activity   Alcohol use: Yes    Comment: once in a blue moon   Drug use: No   Sexual activity: Yes    Birth control/protection: None, Condom  Other Topics Concern   Not on file  Social History Narrative   Right Handed   Lives in a one story    Drinks Caffeine    Lives with husband   Social Drivers of Health   Financial Resource Strain: Low Risk  (06/01/2021)   Overall Financial Resource Strain (CARDIA)    Difficulty of Paying Living Expenses: Not hard at all  Food Insecurity: No Food Insecurity (06/01/2021)   Hunger Vital Sign    Worried About Running Out of Food in the Last Year: Never true    Ran Out of Food in the Last Year: Never true  Transportation Needs: No Transportation Needs (06/01/2021)   PRAPARE - Administrator, Civil Service (Medical): No    Lack of Transportation (Non-Medical): No  Physical Activity: Insufficiently Active (06/01/2021)   Exercise Vital Sign    Days of Exercise per Week: 4 days    Minutes of Exercise per Session: 30 min  Stress: No Stress Concern Present  (06/01/2021)   [name] of Occupational Health - Occupational Stress Questionnaire    Feeling of Stress : Not at all  Social Connections: Moderately Integrated (06/01/2021)   Social Connection and Isolation Panel [NHANES]    Frequency of Communication with Friends and Family: More than three times a week    Frequency of Social Gatherings with Friends and Family: Three times a week    Attends Religious Services: 1 to 4 times per year    Active Member of Clubs or Organizations: No    Attends Banker Meetings: Never    Marital Status: Married    Family History  Problem Relation Age of Onset   Other Maternal Grandmother        malformed kidney   Diabetes Maternal Aunt    Breast cancer Maternal Aunt    Breast cancer Paternal Aunt    Lung cancer Paternal Aunt     Outpatient Encounter Medications as of 06/25/2023  Medication Sig   cetirizine  (ZYRTEC ) 10 MG tablet Take 10 mg by mouth daily.   ondansetron  (ZOFRAN -ODT) 4 MG disintegrating tablet Take 1 tablet (4 mg total) by mouth every 8 (eight) hours as needed for  nausea or vomiting.   pantoprazole  (PROTONIX ) 40 MG tablet Take 1 tablet (40 mg total) by mouth daily.   tiZANidine  (ZANAFLEX ) 4 MG capsule Take 1 capsule (4 mg total) by mouth 3 (three) times daily as needed for muscle spasms. Do not drink alcohol or drive while taking this medication. May cause drowsiness   [DISCONTINUED] levothyroxine  (SYNTHROID ) 50 MCG tablet Take 1 tablet (50 mcg total) by mouth daily before breakfast.   levothyroxine  (SYNTHROID ) 75 MCG tablet Take 1 tablet (75 mcg total) by mouth daily before breakfast.   No facility-administered encounter medications on file as of 06/25/2023.    ALLERGIES: Allergies  Allergen Reactions   Banana Itching    Mouth and throat gets scratchy and itchy    VACCINATION STATUS: Immunization History  Administered Date(s) Administered   Tdap 06/01/2021     HPI  Nicole Freeman is 28 y.o.  female who presents today with a medical history as above. she is being seen in consultation for hyperthyroidism requested by Roxene Cora, PA-C.  she has been dealing with symptoms of palpitations, weight loss (without changing diet), anxiety, hot flashes, alternating constipation and diarrhea, tremors, heart burn, insomnia, inability to be full after eating, and mild intermittent dysphagia and sore throat for 4. These symptoms are progressively worsening and troubling to her.  her most recent thyroid  labs revealed suppressed TSH of 0.007 on 04/05/22.  She notes these symptoms all started about 4 years ago following the birth of her second child.  The symptoms have come and gone but recently have been worse following the birth of her third child.  she denies choking, shortness of breath, no recent voice change.    she denies known family history of thyroid  dysfunction and denies family hx of thyroid  cancer. she denies personal history of goiter. she is not on any anti-thyroid  medications nor on any thyroid  hormone supplements. Denies use of Biotin containing supplements.  she is willing to proceed with appropriate work up and therapy for thyrotoxicosis.  She has history of Von Willebrand disease, Chiari Malformation, and migraines.  She also notes she recently had her Mirena  IUD removed, and while she was waiting to have Nexplanon  inserted, she says her headaches went away and she felt the best she has in a long time.  After the Nexplanon  was inserted, her symptoms started to resurface once again.   Review of systems  Constitutional: + Minimally fluctuating body weight,  current Body mass index is 32.67 kg/m. , + fatigue, no subjective hyperthermia, no subjective hypothermia Eyes: no blurry vision, no xerophthalmia ENT: no sore throat, no nodules palpated in throat, no dysphagia/odynophagia, no hoarseness Cardiovascular: no chest pain, no shortness of breath, no palpitations, no leg  swelling Respiratory: no cough, no shortness of breath Gastrointestinal: no nausea/vomiting/diarrhea Musculoskeletal: no muscle/joint aches Skin: no rashes, no hyperemia Neurological: no tremors, no numbness, no tingling, no dizziness Psychiatric: no depression, no anxiety   Objective:    BP 116/70 (BP Location: Left Arm, Patient Position: Sitting, Cuff Size: Large)   Pulse 84   Ht 5\' 2"  (1.575 m)   Wt 178 lb 9.6 oz (81 kg)   BMI 32.67 kg/m   Wt Readings from Last 3 Encounters:  06/25/23 178 lb 9.6 oz (81 kg)  06/20/23 177 lb (80.3 kg)  05/10/23 176 lb 3.2 oz (79.9 kg)     BP Readings from Last 3 Encounters:  06/25/23 116/70  06/20/23 115/77  05/10/23 132/72     Physical Exam-  Limited  Constitutional:  Body mass index is 32.67 kg/m. , not in acute distress, normal state of mind Eyes:  EOMI, no exophthalmos Musculoskeletal: no gross deformities, strength intact in all four extremities, no gross restriction of joint movements Skin:  no rashes, no hyperemia Neurological: no tremor with outstretched hands   CMP     Component Value Date/Time   NA 136 02/24/2023 1935   NA 135 (L) 01/06/2014 1059   K 3.7 02/24/2023 1935   K 4.3 01/06/2014 1059   CL 108 02/24/2023 1935   CL 102 01/06/2014 1059   CO2 21 (L) 02/24/2023 1935   CO2 28 01/06/2014 1059   GLUCOSE 101 (H) 02/24/2023 1935   GLUCOSE 126 (H) 01/06/2014 1059   BUN 18 02/24/2023 1935   BUN 14 01/06/2014 1059   CREATININE 0.78 02/24/2023 1935   CREATININE 0.88 01/06/2014 1059   CALCIUM 8.8 (L) 02/24/2023 1935   CALCIUM 9.8 01/06/2014 1059   PROT 7.8 02/24/2023 1935   PROT 8.2 01/06/2014 1059   ALBUMIN 4.5 02/24/2023 1935   ALBUMIN 4.4 01/06/2014 1059   AST 18 02/24/2023 1935   AST 21 01/06/2014 1059   ALT 12 02/24/2023 1935   ALT 20 01/06/2014 1059   ALKPHOS 38 02/24/2023 1935   ALKPHOS 70 01/06/2014 1059   BILITOT 0.7 02/24/2023 1935   BILITOT 0.8 01/06/2014 1059   GFRNONAA >60 02/24/2023 1935    GFRNONAA >60 01/06/2014 1059   GFRNONAA >60 03/02/2013 1347   GFRAA >60 05/21/2017 1341   GFRAA >60 01/06/2014 1059   GFRAA >60 03/02/2013 1347     CBC    Component Value Date/Time   WBC 11.9 (H) 02/24/2023 1935   RBC 4.73 02/24/2023 1935   HGB 14.2 02/24/2023 1935   HGB 10.7 (L) 06/01/2021 0806   HCT 42.6 02/24/2023 1935   HCT 32.1 (L) 06/01/2021 0806   PLT 315 02/24/2023 1935   PLT 263 06/01/2021 0806   MCV 90.1 02/24/2023 1935   MCV 94 06/01/2021 0806   MCV 97 01/06/2014 1059   MCH 30.0 02/24/2023 1935   MCHC 33.3 02/24/2023 1935   RDW 13.0 02/24/2023 1935   RDW 12.1 06/01/2021 0806   RDW 12.5 01/06/2014 1059   LYMPHSABS 1.2 02/23/2021 1345   LYMPHSABS 0.9 (L) 01/06/2014 1059   MONOABS 1.1 (H) 08/10/2016 2213   MONOABS 0.8 01/06/2014 1059   EOSABS 0.1 02/23/2021 1345   EOSABS 0.1 01/06/2014 1059   BASOSABS 0.0 02/23/2021 1345   BASOSABS 0.0 01/06/2014 1059     Diabetic Labs (most recent): No results found for: "HGBA1C", "MICROALBUR"  Lipid Panel  No results found for: "CHOL", "TRIG", "HDL", "CHOLHDL", "VLDL", "LDLCALC", "LDLDIRECT", "LABVLDL"   Lab Results  Component Value Date   TSH 1.110 06/18/2023   TSH 2.100 03/18/2023   TSH 2.500 12/05/2022   TSH 2.600 08/27/2022   TSH 6.380 (H) 07/13/2022   TSH 14.300 (H) 05/16/2022   TSH 3.270 02/13/2018   TSH 2.03 10/31/2012   FREET4 0.99 06/18/2023   FREET4 0.98 03/18/2023   FREET4 0.85 12/05/2022   FREET4 0.91 08/27/2022   FREET4 1.17 07/13/2022   FREET4 0.65 (L) 05/16/2022     Thyroid  US  from 05/31/22 CLINICAL DATA:  Hypothyroid.   EXAM: THYROID  ULTRASOUND   TECHNIQUE: Ultrasound examination of the thyroid  gland and adjacent soft tissues was performed.   COMPARISON:  None Available.   FINDINGS: Parenchymal Echotexture: Moderately heterogenous   Isthmus: 0.4 cm   Right lobe: 4.2 x 1.8  x 1.9 cm   Left lobe: 5.1 x 1.5 x 1.7 cm   _________________________________________________________    Estimated total number of nodules >/= 1 cm: 0   Number of spongiform nodules >/=  2 cm not described below (TR1): 0   Number of mixed cystic and solid nodules >/= 1.5 cm not described below (TR2): 0   _________________________________________________________   No discrete nodules are seen within the thyroid  gland. Normal vascularity on color Doppler imaging.   IMPRESSION: Mildly enlarged and heterogeneous thyroid  gland most consistent with thyroiditis.   No discrete thyroid  nodules are evident. No significant hypervascularity.     Electronically Signed   By: Fernando Hoyer M.D.   On: 05/31/2022 16:20     Latest Reference Range & Units 05/16/22 14:29 07/13/22 08:54 08/27/22 15:52 12/05/22 15:23 03/18/23 09:36 06/18/23 13:59  TSH 0.450 - 4.500 uIU/mL 14.300 (H) 6.380 (H) 2.600 2.500 2.100 1.110  Triiodothyronine,Free,Serum 2.0 - 4.4 pg/mL 2.3 3.4 2.8 2.9    T4,Free(Direct) 0.82 - 1.77 ng/dL 1.61 (L) 0.96 0.45 4.09 0.98 0.99  Thyroperoxidase Ab SerPl-aCnc 0 - 34 IU/mL >600 (H)       Thyroglobulin Antibody 0.0 - 0.9 IU/mL 38.9 (H)       (H): Data is abnormally high (L): Data is abnormally low    Assessment & Plan:   1. Abnormal TSH 2. Positive thyroid  antibodies  she is being seen at a kind request of Roxene Cora, PA-C.  Her recent thyroid  labs show positive antibodies, indicating autoimmune thyroid  dysfunction.    Her previsit TFTs are consistent with slight under-replacement and she does have symptoms of such.  She is advised to increase her Levothyroxine  to 75 mcg po daily before breakfast.  Will recheck TFTs prior to next visit and adjust dose accordingly.     -Patient is advised to maintain close follow up with Roxene Cora, PA-C for primary care needs.    I spent  30  minutes in the care of the patient today including review of labs from Thyroid  Function, CMP, and other relevant labs ; imaging/biopsy records (current and previous including  abstractions from other facilities); face-to-face time discussing  her lab results and symptoms, medications doses, her options of short and long term treatment based on the latest standards of care / guidelines;   and documenting the encounter.  Nicole Freeman  participated in the discussions, expressed understanding, and voiced agreement with the above plans.  All questions were answered to her satisfaction. she is encouraged to contact clinic should she have any questions or concerns prior to her return visit.  Follow up plan: Return in about 4 months (around 10/26/2023) for Thyroid  follow up, Previsit labs.   Thank you for involving me in the care of this pleasant patient, and I will continue to update you with her progress.   Hulon Magic, [facility] [address] [place], [place] [address] Phone: [phone] Fax: [phone]  06/25/2023, 8:57 AM

## 2023-07-04 ENCOUNTER — Encounter: Payer: Self-pay | Admitting: Obstetrics & Gynecology

## 2023-07-05 ENCOUNTER — Other Ambulatory Visit: Payer: Self-pay | Admitting: Obstetrics and Gynecology

## 2023-07-05 ENCOUNTER — Encounter: Payer: Self-pay | Admitting: Obstetrics & Gynecology

## 2023-07-05 MED ORDER — NITROFURANTOIN MONOHYD MACRO 100 MG PO CAPS: 100.0000 mg | ORAL_CAPSULE | Freq: Two times a day (BID) | ORAL | 0 refills | Status: DC

## 2023-07-05 MED ORDER — PHENAZOPYRIDINE HCL 200 MG PO TABS: 200.0000 mg | ORAL_TABLET | Freq: Three times a day (TID) | ORAL | 0 refills | Status: DC | PRN

## 2023-08-07 NOTE — Patient Instructions (Signed)
 Nicole Freeman  08/07/2023     @PREFPERIOPPHARMACY @   Your procedure is scheduled on  08/13/2023.   Report to [facility] at  0900  A.M.   Call this number if you have problems the morning of surgery:  [phone]  If you experience any cold or flu symptoms such as cough, fever, chills, shortness of breath, etc. between now and your scheduled surgery, please notify us  at the above number.   Remember:  Do not eat after midnight.   You may drink clear liquids until  0700 am on 08/13/2023.    Clear liquids allowed are:                    Water, Juice (No red color; non-citric and without pulp; diabetics please choose diet or no sugar options), Carbonated beverages (diabetics please choose diet or no sugar options), Clear Tea (No creamer, milk, or cream, including half & half and powdered creamer), Black Coffee Only (No creamer, milk or cream, including half & half and powdered creamer), and Clear Sports drink (No red color; diabetics please choose diet or no sugar options)    Take these medicines the morning of surgery with A SIP OF WATER                            levothyroxine , tizanidine (if needed).    Do not wear jewelry, make-up or nail polish, including gel polish,  artificial nails, or any other type of covering on natural nails (fingers and  toes).  Do not wear lotions, powders, or perfumes, or deodorant.  Do not shave 48 hours prior to surgery.  Men may shave face and neck.  Do not bring valuables to the hospital.  [facility] is not responsible for any belongings or valuables.  Contacts, dentures or bridgework may not be worn into surgery.  Leave your suitcase in the car.  After surgery it may be brought to your room.  For patients admitted to the hospital, discharge time will be determined by your treatment team.  Patients discharged the day of surgery will not be allowed to drive home and must have someone with them for 24 hours.    Special  instructions:   DO NOT smoke tobacco or vape for 24 hours before your procedure.  Please read over the following fact sheets that you were given. Pain Booklet, Coughing and Deep Breathing, Surgical Site Infection Prevention, Anesthesia Post-op Instructions, and Care and Recovery After Surgery         Laparoscopically Assisted Vaginal Hysterectomy, Care After After a LAVH, it's common to have soreness and numbness in your incision areas. You'll have pain in your belly. You may also have: Bleeding and discharge from the vagina. Tiredness. Sadness and other emotions. If your ovaries were taken out, you may also have symptoms of menopause, such as hot flashes, night sweats, and lack of sleep. Follow these instructions at home: Medicines Take over-the-counter and prescription medicines only as told by your health care provider. If you were given antibiotics, take them as told by your provider. Do not stop taking the antibiotic even if you start to feel better. Ask your provider if the medicine prescribed to you: Requires you to avoid driving or using machinery. Can cause constipation. You may need to take these actions to prevent or treat constipation: Drink enough fluid to keep your pee (urine) pale yellow. Take over-the-counter or prescription  medicines. Eat foods that are high in fiber, such as beans, whole grains, and fresh fruits and vegetables. Limit foods that are high in fat and processed sugars, such as fried or sweet foods. Incision care  Follow instructions from your provider about how to take care of your incisions. Make sure you: Wash your hands with soap and water for at least 20 seconds before and after you change your bandage. If soap and water aren't available, use hand sanitizer. Change your dressing as told by your provider. Leave stitches, skin glue, or tape strips in place. These skin closures may need to stay in place for 2 weeks or longer. If tape strip edges start  to loosen and curl up, you may trim the loose edges. Do not remove tape strips completely unless your provider tells you to do that. Check your incision areas every day for signs of infection. Check for: More redness, swelling, or pain. More fluid or blood. Warmth. Pus or a bad smell. Activity  Rest as told by your provider. Do not sit for a long time without moving. Get up to take short walks every 1-2 hours. This will improve blood flow and breathing. Ask for help if you feel weak or unsteady. You may have to avoid lifting. Ask your provider how much you can safely lift. Return to your normal activities as told by your provider. Ask your provider what activities are safe for you. Lifestyle Do not use any products that contain nicotine or tobacco. These products include cigarettes, chewing tobacco, and vaping devices, such as e-cigarettes. These can delay healing after surgery. If you need help quitting, ask your provider. Do not drink alcohol until your provider approves. General instructions Do not douche, use tampons, have sex, or put anything in the vagina for at least 6 weeks. If you struggle with physical or emotional changes after your procedure, speak with your provider or a therapist. Do not take baths, swim, or use a hot tub until your provider approves. Ask your provider if you may take showers. You may only be allowed to take sponge baths. Try to have someone at home with you for the first 1-2 weeks to help with your daily chores. Wear compression stockings as told by your provider. These stockings help to prevent blood clots and reduce swelling in your legs. Your provider may give you more instructions. Make sure you know what you can and can't do. Contact a health care provider if: You have any signs of infection. You have pain and your pain medicine doesn't help. You feel dizzy or light-headed. You have trouble peeing. You vomit or feel like you may vomit, and the symptoms  do not go away. You have pus or discharge from your vagina that smells bad. Get help right away if: You have a fever and your symptoms suddenly get worse. You have very bad pain in the abdomen. You have chest pain or shortness of breath. You faint. You have pain, swelling, or redness in your leg. You have heavy bleeding in your vagina that soaks through a pad in less than 1 hour. You see blood clots in your bleeding. These symptoms may be an emergency. Get help right away. Call 911. Do not wait to see if the symptoms will go away. Do not drive yourself to the hospital. This information is not intended to replace advice given to you by your health care provider. Make sure you discuss any questions you have with your health care provider. Document  Revised: 05/04/2022 Document Reviewed: 05/04/2022 Elsevier Patient Education  2024 [employer].General Anesthesia, Adult, Care After The following information offers guidance on how to care for yourself after your procedure. Your health care provider may also give you more specific instructions. If you have problems or questions, contact your health care provider. What can I expect after the procedure? After the procedure, it is common for people to: Have pain or discomfort at the IV site. Have nausea or vomiting. Have a sore throat or hoarseness. Have trouble concentrating. Feel cold or chills. Feel weak, sleepy, or tired (fatigue). Have soreness and body aches. These can affect parts of the body that were not involved in surgery. Follow these instructions at home: For the time period you were told by your health care provider:  Rest. Do not participate in activities where you could fall or become injured. Do not drive or use machinery. Do not drink alcohol. Do not take sleeping pills or medicines that cause drowsiness. Do not make important decisions or sign legal documents. Do not take care of children on your own. General  instructions Drink enough fluid to keep your urine pale yellow. If you have sleep apnea, surgery and certain medicines can increase your risk for breathing problems. Follow instructions from your health care provider about wearing your sleep device: Anytime you are sleeping, including during daytime naps. While taking prescription pain medicines, sleeping medicines, or medicines that make you drowsy. Return to your normal activities as told by your health care provider. Ask your health care provider what activities are safe for you. Take over-the-counter and prescription medicines only as told by your health care provider. Do not use any products that contain nicotine or tobacco. These products include cigarettes, chewing tobacco, and vaping devices, such as e-cigarettes. These can delay incision healing after surgery. If you need help quitting, ask your health care provider. Contact a health care provider if: You have nausea or vomiting that does not get better with medicine. You vomit every time you eat or drink. You have pain that does not get better with medicine. You cannot urinate or have bloody urine. You develop a skin rash. You have a fever. Get help right away if: You have trouble breathing. You have chest pain. You vomit blood. These symptoms may be an emergency. Get help right away. Call 911. Do not wait to see if the symptoms will go away. Do not drive yourself to the hospital. Summary After the procedure, it is common to have a sore throat, hoarseness, nausea, vomiting, or to feel weak, sleepy, or fatigue. For the time period you were told by your health care provider, do not drive or use machinery. Get help right away if you have difficulty breathing, have chest pain, or vomit blood. These symptoms may be an emergency. This information is not intended to replace advice given to you by your health care provider. Make sure you discuss any questions you have with your health  care provider. Document Revised: 04/21/2021 Document Reviewed: 04/21/2021 Elsevier Patient Education  2024 Elsevier Inc.How to Use Chlorhexidine at Home in the Shower Chlorhexidine gluconate (CHG) is a germ-killing (antiseptic) wash that's used to clean the skin. It can get rid of the germs that normally live on the skin and can keep them away for about 24 hours. If you're having surgery, you may be told to shower with CHG at home the night before surgery. This can help lower your risk for infection. To use CHG wash in the  shower, follow the steps below. Supplies needed: CHG body wash. Clean washcloth. Clean towel. How to use CHG in the shower Follow these steps unless you're told to use CHG in a different way: Start the shower. Use your normal soap and shampoo to wash your face and hair. Turn off the shower or move out of the shower stream. Pour CHG onto a clean washcloth. Do not use any type of brush or rough sponge. Start at your neck, washing your body down to your toes. Make sure you: Wash the part of your body where the surgery will be done for at least 1 minute. Do not scrub. Do not use CHG on your head or face unless your health care provider tells you to. If it gets into your ears or eyes, rinse them well with water. Do not wash your genitals with CHG. Wash your back and under your arms. Make sure to wash skin folds. Let the CHG sit on your skin for 1-2 minutes or as long as told. Rinse your entire body in the shower, including all body creases and folds. Turn off the shower. Dry off with a clean towel. Do not put anything on your skin afterward, such as powder, lotion, or perfume. Put on clean clothes or pajamas. If it's the night before surgery, sleep in clean sheets. General tips Use CHG only as told, and follow the instructions on the label. Use the full amount of CHG as told. This is often one bottle. Do not smoke and stay away from flames after using CHG. Your skin may  feel sticky after using CHG. This is normal. The sticky feeling will go away as the CHG dries. Do not use CHG: If you have a chlorhexidine allergy or have reacted to chlorhexidine in the past. On open wounds or areas of skin that have broken skin, cuts, or scrapes. On babies younger than 30 months of age. Contact a health care provider if: You have questions about using CHG. Your skin gets irritated or itchy. You have a rash after using CHG. You swallow any CHG. Call your local poison control center [phone] in the [place] Your eyes itch badly, or they become very red or swollen. Your hearing changes. You have trouble seeing. If you can't reach your provider, go to an urgent care or emergency room. Do not drive yourself. Get help right away if: You have swelling or tingling in your mouth or throat. You make high-pitched whistling sounds when you breathe, most often when you breathe out (wheeze). You have trouble breathing. These symptoms may be an emergency. Call 911 right away. Do not wait to see if the symptoms will go away. Do not drive yourself to the hospital. This information is not intended to replace advice given to you by your health care provider. Make sure you discuss any questions you have with your health care provider. Document Revised: 08/07/2022 Document Reviewed: 08/03/2021 Elsevier Patient Education  2024 [employer].

## 2023-08-08 ENCOUNTER — Other Ambulatory Visit: Payer: Self-pay

## 2023-08-08 ENCOUNTER — Ambulatory Visit: Payer: Self-pay | Admitting: Obstetrics & Gynecology

## 2023-08-08 ENCOUNTER — Encounter (HOSPITAL_COMMUNITY): Payer: Self-pay

## 2023-08-08 ENCOUNTER — Encounter: Payer: Self-pay | Admitting: Obstetrics & Gynecology

## 2023-08-08 ENCOUNTER — Encounter (HOSPITAL_COMMUNITY)
Admission: RE | Admit: 2023-08-08 | Discharge: 2023-08-08 | Disposition: A | Discharge status: Home / Self Care | Source: Ambulatory Visit | Attending: Obstetrics & Gynecology | Admitting: Obstetrics & Gynecology

## 2023-08-08 DIAGNOSIS — Z01818 Encounter for other preprocedural examination: Secondary | ICD-10-CM

## 2023-08-08 DIAGNOSIS — Z01812 Encounter for preprocedural laboratory examination: Secondary | ICD-10-CM | POA: Insufficient documentation

## 2023-08-08 DIAGNOSIS — F419 Anxiety disorder, unspecified: Secondary | ICD-10-CM

## 2023-08-08 LAB — CBC
HCT: 36.3 % (ref 36.0–46.0)
Hemoglobin: 11.9 g/dL — ABNORMAL LOW (ref 12.0–15.0)
MCH: 29.5 pg (ref 26.0–34.0)
MCHC: 32.8 g/dL (ref 30.0–36.0)
MCV: 90.1 fL (ref 80.0–100.0)
Platelets: 299 10*3/uL (ref 150–400)
RBC: 4.03 MIL/uL (ref 3.87–5.11)
RDW: 12.8 % (ref 11.5–15.5)
WBC: 5.1 10*3/uL (ref 4.0–10.5)
nRBC: 0 % (ref 0.0–0.2)

## 2023-08-08 LAB — TYPE AND SCREEN
ABO/RH(D): A POS
Antibody Screen: NEGATIVE

## 2023-08-08 LAB — PREGNANCY, URINE: Preg Test, Ur: NEGATIVE

## 2023-08-08 MED ORDER — HYDROXYZINE HCL 10 MG PO TABS: 10.0000 mg | ORAL_TABLET | Freq: Two times a day (BID) | ORAL | 0 refills | Status: DC | PRN

## 2023-08-08 NOTE — Telephone Encounter (Signed)
 Called patient and reviewed her concerns.  She does indeed want to proceed with surgery; however, it makes her anxious to think about being cut open and having someone work on her internal body.  She notes feeling heart palpitations and shortness of breath when she thinks about these things.  Reviewed with patient that this is an elective surgery and it is ultimately her decision as to whether or not she proceeds.  She states she does want to proceed - Atarax sent in to take as needed for when she is having these thoughts

## 2023-08-12 ENCOUNTER — Encounter: Payer: Self-pay | Admitting: Obstetrics & Gynecology

## 2023-08-12 NOTE — H&P (Signed)
 [facility] Attending History and Physical  Nicole Freeman is a 28 y.o. [phone] who presents for scheduled robotic hysterectomy, bilateral salpingectomy, possible cystoscopy.  In review, she has been struggling with considerable dysmenorrhea and AUB.  Tired both Nexplanon  and multiple IUDs, but noted worsening migraines. Menses are irregular sometimes 2 periods in a single month, sometimes only a few days.  Other times,  bleeding is very heavy and may go through an ultra-tampon in .  +dyspareuna, +dysmenorrhea- tries not to take OTC medication  She has completed childbearing and wishes to proceed with permanent surgical intervention.  Denies any abnormal vaginal discharge, fevers, chills, sweats, dysuria, nausea, vomiting, other GI or GU symptoms or other general symptoms. ***  Past Medical History:  Diagnosis Date   Chiari malformation type I (HCC)    Hashimoto's disease    Headache    Thyroid  activity decreased    Von Willebrand disease (HCC)    Past Surgical History:  Procedure Laterality Date   WISDOM TOOTH EXTRACTION     OB History  Gravida Para Term Preterm AB Living  3 3 3   3   SAB IAB Ectopic Multiple Live Births     0 3    # Outcome Date GA Lbr Len/2nd Weight Sex Type Anes PTL Lv  3 Term 09/07/21 [redacted]w[redacted]d 04:11 / 00:12 3500 g F Vag-Spont EPI  LIV  2 Term 01/08/18 [redacted]w[redacted]d 06:36 / 00:55 3725 g M Vag-Spont EPI N LIV  1 Term 11/26/11 [redacted]w[redacted]d  2410 g M Vag-Spont EPI N LIV  Patient denies any other pertinent gynecologic issues.  No current facility-administered medications on file prior to encounter.   Current Outpatient Medications on File Prior to Encounter  Medication Sig Dispense Refill   levothyroxine  (SYNTHROID ) 75 MCG tablet Take 1 tablet (75 mcg total) by mouth daily before breakfast. 90 tablet 1   methylPREDNISolone (MEDROL DOSEPAK) 4 MG TBPK tablet Take by mouth See admin instructions. Take these number of tablets on  consecutive days:6-5-4-3-2-1     pantoprazole  (PROTONIX ) 40 MG tablet Take 1 tablet (40 mg total) by mouth daily. (Patient taking differently: Take 40 mg by mouth every evening.) 30 tablet 0   tiZANidine  (ZANAFLEX ) 4 MG capsule Take 1 capsule (4 mg total) by mouth 3 (three) times daily as needed for muscle spasms. Do not drink alcohol or drive while taking this medication. May cause drowsiness 15 capsule 0   Allergies  Allergen Reactions   Banana Itching    Mouth and throat gets scratchy and itchy    Social History:   reports that she has quit smoking. Her smoking use included cigarettes. She has never used smokeless tobacco. She reports current alcohol use. She reports that she does not use drugs. Family History  Problem Relation Age of Onset   Other Maternal Grandmother        malformed kidney   Diabetes Maternal Aunt    Breast cancer Maternal Aunt    Breast cancer Paternal Aunt    Lung cancer Paternal Aunt     Review of Systems: Pertinent items noted in HPI and remainder of comprehensive ROS otherwise negative.  PHYSICAL EXAM: Last menstrual period 07/25/2023, not currently breastfeeding. CONSTITUTIONAL: Well-developed, well-nourished female in no acute distress.  SKIN: Skin is warm and dry. No rash noted. Not diaphoretic. No erythema. No pallor. NEUROLOGIC: Alert and oriented to person, place, and time. Normal reflexes, muscle tone coordination. No cranial nerve deficit noted. PSYCHIATRIC: Normal mood and affect. Normal  behavior. Normal judgment and thought content. CARDIOVASCULAR: Normal heart rate noted, regular rhythm RESPIRATORY: Effort and breath sounds normal, no problems with respiration noted ABDOMEN: Soft, nontender, nondistended. PELVIC: deferred MUSCULOSKELETAL: no calf tenderness bilaterally EXT: no edema bilaterally, normal pulses  Labs: Results for orders placed or performed during the hospital encounter of 08/08/23 (from the past 2 weeks)  CBC   Collection  Time: 08/08/23  9:42 AM  Result Value Ref Range   WBC 5.1 4.0 - 10.5 K/uL   RBC 4.03 3.87 - 5.11 MIL/uL   Hemoglobin 11.9 (L) 12.0 - 15.0 g/dL   HCT 63.6 63.9 - 53.9 %   MCV 90.1 80.0 - 100.0 fL   MCH 29.5 26.0 - 34.0 pg   MCHC 32.8 30.0 - 36.0 g/dL   RDW 87.1 88.4 - 84.4 %   Platelets 299 150 - 400 K/uL   nRBC 0.0 0.0 - 0.2 %  Pregnancy, urine   Collection Time: 08/08/23  9:42 AM  Result Value Ref Range   Preg Test, Ur NEGATIVE NEGATIVE  Type and screen [facility]   Collection Time: 08/08/23  9:42 AM  Result Value Ref Range   ABO/RH(D) A POS    Antibody Screen NEG    Sample Expiration      08/22/2023,2359 Performed at [facility], [address] [place], [place] [address]     Imaging Studies: 6 x 4.6 x 5.9 cm, Total uterine volume 86 cc,retroverted homogeneous uterus,normal  Normal ovaries bilateraly  Assessment: AUB, Dysmenorrhea  Plan: Robotic assisted laparoscopic hysterectomy, bilateral salpingectomy, possible cystoscopy -NPO -LR @ 125cc/hr -SCDs to OR -Risk/benefits and alternatives reviewed with the patient including but not limited to risk of bleeding, infection and injury to surrounding organs requiring further surgical intervention.  Also discussed potential risk of conversion to open procedure.  Risk/benefits previously reviewed- seen prior progress note.  Questions and concerns were addressed and pt desires to proceed  Sayla Golonka, DO Attending Obstetrician & Gynecologist, [facility] for [facility], [facility]

## 2023-08-13 ENCOUNTER — Ambulatory Visit (HOSPITAL_COMMUNITY)
Admission: RE | Admit: 2023-08-13 | Discharge: 2023-08-13 | Disposition: A | Discharge status: Home / Self Care | Attending: Obstetrics & Gynecology | Admitting: Obstetrics & Gynecology

## 2023-08-13 ENCOUNTER — Other Ambulatory Visit: Payer: Self-pay

## 2023-08-13 ENCOUNTER — Ambulatory Visit (HOSPITAL_COMMUNITY): Admitting: Anesthesiology

## 2023-08-13 ENCOUNTER — Encounter (HOSPITAL_COMMUNITY)
Admission: RE | Disposition: A | Discharge status: Home / Self Care | Payer: Self-pay | Source: Home / Self Care | Attending: Obstetrics & Gynecology

## 2023-08-13 ENCOUNTER — Encounter (HOSPITAL_COMMUNITY): Payer: Self-pay | Admitting: Obstetrics & Gynecology

## 2023-08-13 DIAGNOSIS — Z87891 Personal history of nicotine dependence: Secondary | ICD-10-CM | POA: Diagnosis not present

## 2023-08-13 DIAGNOSIS — N809 Endometriosis, unspecified: Secondary | ICD-10-CM

## 2023-08-13 DIAGNOSIS — N9489 Other specified conditions associated with female genital organs and menstrual cycle: Secondary | ICD-10-CM | POA: Diagnosis not present

## 2023-08-13 DIAGNOSIS — N946 Dysmenorrhea, unspecified: Secondary | ICD-10-CM

## 2023-08-13 DIAGNOSIS — I10 Essential (primary) hypertension: Secondary | ICD-10-CM | POA: Diagnosis not present

## 2023-08-13 DIAGNOSIS — N939 Abnormal uterine and vaginal bleeding, unspecified: Secondary | ICD-10-CM

## 2023-08-13 DIAGNOSIS — Z01818 Encounter for other preprocedural examination: Secondary | ICD-10-CM

## 2023-08-13 DIAGNOSIS — N80329 Endometriosis of the posterior cul-de-sac, unspecified depth: Secondary | ICD-10-CM | POA: Insufficient documentation

## 2023-08-13 DIAGNOSIS — N838 Other noninflammatory disorders of ovary, fallopian tube and broad ligament: Secondary | ICD-10-CM | POA: Insufficient documentation

## 2023-08-13 DIAGNOSIS — E039 Hypothyroidism, unspecified: Secondary | ICD-10-CM | POA: Insufficient documentation

## 2023-08-13 HISTORY — PX: ROBOTIC ASSISTED TOTAL HYSTERECTOMY WITH BILATERAL SALPINGO OOPHERECTOMY: SHX6086

## 2023-08-13 SURGERY — HYSTERECTOMY, TOTAL, ROBOT-ASSISTED, LAPAROSCOPIC, WITH BILATERAL SALPINGO-OOPHORECTOMY
Anesthesia: General | Site: Abdomen | Laterality: Bilateral

## 2023-08-13 MED ORDER — ROCURONIUM BROMIDE 10 MG/ML (PF) SYRINGE
PREFILLED_SYRINGE | INTRAVENOUS | Status: DC | PRN
  Administered 2023-08-13: 15 mg via INTRAVENOUS
  Administered 2023-08-13: 60 mg via INTRAVENOUS
  Administered 2023-08-13: 10 mg via INTRAVENOUS

## 2023-08-13 MED ORDER — LIDOCAINE 2% (20 MG/ML) 5 ML SYRINGE
INTRAMUSCULAR | Status: DC | PRN
  Administered 2023-08-13: 100 mg via INTRAVENOUS

## 2023-08-13 MED ORDER — MIDAZOLAM HCL 2 MG/2ML IJ SOLN
INTRAMUSCULAR | Status: AC
  Filled 2023-08-13: qty 2

## 2023-08-13 MED ORDER — OXYCODONE HCL 5 MG PO TABS
5.0000 mg | ORAL_TABLET | Freq: Once | ORAL | Status: AC | PRN
  Administered 2023-08-13: 5 mg via ORAL
  Filled 2023-08-13: qty 1

## 2023-08-13 MED ORDER — ONDANSETRON HCL 4 MG/2ML IJ SOLN: 4.0000 mg | Freq: Once | INTRAMUSCULAR | Status: DC | PRN

## 2023-08-13 MED ORDER — KETAMINE HCL 50 MG/5ML IJ SOSY
PREFILLED_SYRINGE | INTRAMUSCULAR | Status: AC
  Filled 2023-08-13: qty 5

## 2023-08-13 MED ORDER — OXYCODONE HCL 5 MG PO TABS: 5.0000 mg | ORAL_TABLET | Freq: Four times a day (QID) | ORAL | 0 refills | Status: AC | PRN

## 2023-08-13 MED ORDER — FENTANYL CITRATE (PF) 250 MCG/5ML IJ SOLN
INTRAMUSCULAR | Status: AC
Start: 2023-08-13 — End: 2023-08-13
  Filled 2023-08-13: qty 5

## 2023-08-13 MED ORDER — SODIUM CHLORIDE 0.9 % IR SOLN
Status: DC | PRN
  Administered 2023-08-13: 3000 mL

## 2023-08-13 MED ORDER — ONDANSETRON HCL 4 MG/2ML IJ SOLN
INTRAMUSCULAR | Status: AC
  Filled 2023-08-13: qty 2

## 2023-08-13 MED ORDER — ONDANSETRON HCL 4 MG/2ML IJ SOLN
INTRAMUSCULAR | Status: DC | PRN
  Administered 2023-08-13: 4 mg via INTRAVENOUS

## 2023-08-13 MED ORDER — LIDOCAINE 2% (20 MG/ML) 5 ML SYRINGE
INTRAMUSCULAR | Status: AC
  Filled 2023-08-13: qty 5

## 2023-08-13 MED ORDER — BUPIVACAINE HCL (PF) 0.25 % IJ SOLN
INTRAMUSCULAR | Status: DC | PRN
  Administered 2023-08-13: 40 mL

## 2023-08-13 MED ORDER — FENTANYL CITRATE PF 50 MCG/ML IJ SOSY
25.0000 ug | PREFILLED_SYRINGE | INTRAMUSCULAR | Status: DC | PRN
  Administered 2023-08-13: 50 ug via INTRAVENOUS
  Administered 2023-08-13: 25 ug via INTRAVENOUS
  Filled 2023-08-13 (×2): qty 1

## 2023-08-13 MED ORDER — ACETAMINOPHEN 325 MG PO TABS: 650.0000 mg | ORAL_TABLET | Freq: Four times a day (QID) | ORAL | Status: DC | PRN

## 2023-08-13 MED ORDER — DOCUSATE SODIUM 100 MG PO CAPS: 100.0000 mg | ORAL_CAPSULE | Freq: Two times a day (BID) | ORAL | 0 refills | Status: DC

## 2023-08-13 MED ORDER — CEFAZOLIN SODIUM-DEXTROSE 2-4 GM/100ML-% IV SOLN
2.0000 g | INTRAVENOUS | Status: AC
  Administered 2023-08-13: 2 g via INTRAVENOUS

## 2023-08-13 MED ORDER — SUGAMMADEX SODIUM 200 MG/2ML IV SOLN
INTRAVENOUS | Status: DC | PRN
  Administered 2023-08-13: 200 mg via INTRAVENOUS

## 2023-08-13 MED ORDER — KETOROLAC TROMETHAMINE 10 MG PO TABS: 10.0000 mg | ORAL_TABLET | Freq: Three times a day (TID) | ORAL | 0 refills | Status: AC

## 2023-08-13 MED ORDER — ONDANSETRON HCL 4 MG PO TABS: 4.0000 mg | ORAL_TABLET | Freq: Three times a day (TID) | ORAL | 0 refills | Status: DC | PRN

## 2023-08-13 MED ORDER — CEFAZOLIN SODIUM-DEXTROSE 2-4 GM/100ML-% IV SOLN
INTRAVENOUS | Status: AC
  Filled 2023-08-13: qty 100

## 2023-08-13 MED ORDER — CHLORHEXIDINE GLUCONATE 0.12 % MT SOLN: 15.0000 mL | Freq: Once | OROMUCOSAL | Status: DC

## 2023-08-13 MED ORDER — IBUPROFEN 600 MG PO TABS: 600.0000 mg | ORAL_TABLET | Freq: Four times a day (QID) | ORAL | 0 refills | Status: DC | PRN

## 2023-08-13 MED ORDER — KETAMINE HCL 50 MG/5ML IJ SOSY
PREFILLED_SYRINGE | INTRAMUSCULAR | Status: DC | PRN
  Administered 2023-08-13: 50 mg via INTRAVENOUS

## 2023-08-13 MED ORDER — DEXAMETHASONE SODIUM PHOSPHATE 10 MG/ML IJ SOLN
INTRAMUSCULAR | Status: AC
  Filled 2023-08-13: qty 1

## 2023-08-13 MED ORDER — ACETAMINOPHEN 10 MG/ML IV SOLN
1000.0000 mg | Freq: Once | INTRAVENOUS | Status: AC
  Administered 2023-08-13: 1000 mg via INTRAVENOUS

## 2023-08-13 MED ORDER — GABAPENTIN 300 MG PO CAPS: 300.0000 mg | ORAL_CAPSULE | Freq: Three times a day (TID) | ORAL | 0 refills | Status: DC

## 2023-08-13 MED ORDER — BUPIVACAINE HCL (PF) 0.25 % IJ SOLN
INTRAMUSCULAR | Status: AC
  Filled 2023-08-13: qty 60

## 2023-08-13 MED ORDER — FENTANYL CITRATE (PF) 100 MCG/2ML IJ SOLN
INTRAMUSCULAR | Status: AC
Start: 2023-08-13 — End: 2023-08-13
  Filled 2023-08-13: qty 2

## 2023-08-13 MED ORDER — KETOROLAC TROMETHAMINE 30 MG/ML IJ SOLN
INTRAMUSCULAR | Status: AC
  Filled 2023-08-13: qty 1

## 2023-08-13 MED ORDER — LACTATED RINGERS IV SOLN: INTRAVENOUS | Status: DC | PRN

## 2023-08-13 MED ORDER — POVIDONE-IODINE 10 % EX SWAB: 2.0000 | Freq: Once | CUTANEOUS | Status: DC

## 2023-08-13 MED ORDER — PROPOFOL 10 MG/ML IV BOLUS
INTRAVENOUS | Status: AC
  Filled 2023-08-13: qty 20

## 2023-08-13 MED ORDER — DEXAMETHASONE SODIUM PHOSPHATE 10 MG/ML IJ SOLN
INTRAMUSCULAR | Status: DC | PRN
  Administered 2023-08-13: 5 mg via INTRAVENOUS

## 2023-08-13 MED ORDER — SUGAMMADEX SODIUM 200 MG/2ML IV SOLN
INTRAVENOUS | Status: AC
  Filled 2023-08-13: qty 4

## 2023-08-13 MED ORDER — HEMOSTATIC AGENTS (NO CHARGE) OPTIME
TOPICAL | Status: DC | PRN
  Administered 2023-08-13: 1 via TOPICAL

## 2023-08-13 MED ORDER — PROPOFOL 10 MG/ML IV BOLUS
INTRAVENOUS | Status: DC | PRN
  Administered 2023-08-13: 200 mg via INTRAVENOUS

## 2023-08-13 MED ORDER — MIDAZOLAM HCL 2 MG/2ML IJ SOLN
INTRAMUSCULAR | Status: DC | PRN
  Administered 2023-08-13: 2 mg via INTRAVENOUS

## 2023-08-13 MED ORDER — ROCURONIUM BROMIDE 10 MG/ML (PF) SYRINGE
PREFILLED_SYRINGE | INTRAVENOUS | Status: AC
  Filled 2023-08-13: qty 10

## 2023-08-13 MED ORDER — ACETAMINOPHEN 10 MG/ML IV SOLN
INTRAVENOUS | Status: AC
Start: 2023-08-13 — End: 2023-08-13
  Filled 2023-08-13: qty 100

## 2023-08-13 MED ORDER — OXYCODONE HCL 5 MG/5ML PO SOLN: 5.0000 mg | Freq: Once | ORAL | Status: AC | PRN

## 2023-08-13 MED ORDER — KETOROLAC TROMETHAMINE 30 MG/ML IJ SOLN
30.0000 mg | INTRAMUSCULAR | Status: AC
  Administered 2023-08-13: 30 mg via INTRAVENOUS

## 2023-08-13 MED ORDER — CHLORHEXIDINE GLUCONATE 0.12 % MT SOLN
OROMUCOSAL | Status: AC
  Administered 2023-08-13: 15 mL
  Filled 2023-08-13: qty 15

## 2023-08-13 MED ORDER — STERILE WATER FOR IRRIGATION IR SOLN
Status: DC | PRN
  Administered 2023-08-13: 1000 mL

## 2023-08-13 MED ORDER — FENTANYL CITRATE (PF) 250 MCG/5ML IJ SOLN
INTRAMUSCULAR | Status: DC | PRN
  Administered 2023-08-13: 25 ug via INTRAVENOUS
  Administered 2023-08-13: 50 ug via INTRAVENOUS
  Administered 2023-08-13: 100 ug via INTRAVENOUS
  Administered 2023-08-13 (×2): 50 ug via INTRAVENOUS
  Administered 2023-08-13: 25 ug via INTRAVENOUS

## 2023-08-13 SURGICAL SUPPLY — 53 items
BAG URINE DRAIN 2000ML AR STRL (UROLOGICAL SUPPLIES) ×3 IMPLANT
BLADE SURG SZ11 CARB STEEL (BLADE) ×3 IMPLANT
CATH FOLEY 3WAY 30CC 16FR (CATHETERS) ×3 IMPLANT
CAUTERY HOOK MNPLR 1.6 DVNC XI (INSTRUMENTS) ×3 IMPLANT
CHLORAPREP W/TINT 26 (MISCELLANEOUS) ×3 IMPLANT
COVER LIGHT HANDLE (MISCELLANEOUS) ×2 IMPLANT
COVER MAYO STAND XLG (MISCELLANEOUS) ×3 IMPLANT
DERMABOND ADVANCED .7 DNX12 (GAUZE/BANDAGES/DRESSINGS) ×4 IMPLANT
DRAPE ARM DVNC X/XI (DISPOSABLE) ×12 IMPLANT
DRAPE COLUMN DVNC XI (DISPOSABLE) ×3 IMPLANT
DRAPE UTILITY W/TAPE 26X15 (DRAPES) ×6 IMPLANT
DRIVER NDL MEGA 8 DVNC XI (INSTRUMENTS) ×2 IMPLANT
DRIVER NDLE MEGA DVNC XI (INSTRUMENTS) ×3 IMPLANT
ELECTRODE REM PT RTRN 9FT ADLT (ELECTROSURGICAL) ×3 IMPLANT
FORCEPS PROGRASP DVNC XI (FORCEP) ×3 IMPLANT
GAUZE 4X4 16PLY ~~LOC~~+RFID DBL (SPONGE) ×6 IMPLANT
GLOVE BIO SURGEON STRL SZ 6.5 (GLOVE) ×9 IMPLANT
GLOVE BIOGEL PI IND STRL 7.0 (GLOVE) ×17 IMPLANT
GOWN STRL REUS W/ TWL LRG LVL3 (GOWN DISPOSABLE) ×8 IMPLANT
GOWN STRL REUS W/TWL LRG LVL3 (GOWN DISPOSABLE) ×7 IMPLANT
KIT PINK PAD W/HEAD ARM REST (MISCELLANEOUS) ×3 IMPLANT
KIT TURNOVER CYSTO (KITS) ×3 IMPLANT
MANIFOLD NEPTUNE II (INSTRUMENTS) ×3 IMPLANT
MANIPULATOR VCARE LG CRV RETR (MISCELLANEOUS) ×1 IMPLANT
NDL HYPO 25X1 1.5 SAFETY (NEEDLE) ×2 IMPLANT
NDL INSUFFLATION 14GA 120MM (NEEDLE) ×2 IMPLANT
NEEDLE HYPO 25X1 1.5 SAFETY (NEEDLE) ×3 IMPLANT
NEEDLE INSUFFLATION 14GA 120MM (NEEDLE) ×3 IMPLANT
NS IRRIG 500ML POUR BTL (IV SOLUTION) ×3 IMPLANT
OBTURATOR OPTICALSTD 8 DVNC (TROCAR) ×3 IMPLANT
PACK PERI GYN (CUSTOM PROCEDURE TRAY) ×3 IMPLANT
PENCIL HANDSWITCHING (ELECTRODE) ×1 IMPLANT
POWDER SURGICEL 3.0 GRAM (HEMOSTASIS) ×3 IMPLANT
SEAL UNIV 5-12 XI (MISCELLANEOUS) ×9 IMPLANT
SEALER VESSEL EXT DVNC XI (MISCELLANEOUS) ×3 IMPLANT
SET BASIN LINEN APH (SET/KITS/TRAYS/PACK) ×3 IMPLANT
SET TRI-LUMEN FLTR TB AIRSEAL (TUBING) ×3 IMPLANT
SET TUBE IRRIG SUCTION NO TIP (IRRIGATION / IRRIGATOR) ×3 IMPLANT
SOL .9 NS 3000ML IRR UROMATIC (IV SOLUTION) ×1 IMPLANT
SOLUTION ANTFG W/FOAM PAD STRL (MISCELLANEOUS) ×3 IMPLANT
SPONGE T-LAP 18X18 ~~LOC~~+RFID (SPONGE) ×3 IMPLANT
SURGILUBE 2OZ TUBE FLIPTOP (MISCELLANEOUS) ×3 IMPLANT
SUT MNCRL AB 4-0 PS2 18 (SUTURE) ×3 IMPLANT
SUT VIC AB 2-0 CT2 27 (SUTURE) ×3 IMPLANT
SUTURE DVC VLC 180 0 12IN GS21 (SUTURE) ×1 IMPLANT
SYR 10ML LL (SYRINGE) ×6 IMPLANT
SYR 20ML LL LF (SYRINGE) ×3 IMPLANT
SYR 50ML LL SCALE MARK (SYRINGE) ×3 IMPLANT
SYR CONTROL 10ML LL (SYRINGE) ×6 IMPLANT
SYR TOOMEY 50ML (SYRINGE) ×3 IMPLANT
TIP ENDOSCOPIC SURGICEL (TIP) ×3 IMPLANT
TROCAR PORT AIRSEAL 8X120 (TROCAR) ×3 IMPLANT
WATER STERILE IRR 500ML POUR (IV SOLUTION) ×4 IMPLANT

## 2023-08-13 NOTE — Transfer of Care (Signed)
 Immediate Anesthesia Transfer of Care Note  Patient: Nicole Freeman  Procedure(s) Performed: HYSTERECTOMY, TOTAL, ROBOT-ASSISTED, LAPAROSCOPIC, WITH BILATERAL SALPINGECTOMY. (Bilateral: Abdomen) ABLATION, ENDOMETRIOSIS, LAPAROSCOPIC (Abdomen)  Patient Location: PACU  Anesthesia Type:General  Level of Consciousness: awake, alert , and oriented  Airway & Oxygen Therapy: Patient Spontanous Breathing  Post-op Assessment: Report given to RN and Post -op Vital signs reviewed and stable  Post vital signs: Reviewed and stable  Last Vitals:  Vitals Value Taken Time  BP 121/68 08/13/23 13:10  Temp 36.6 C 08/13/23 13:10  Pulse 99 08/13/23 13:12  Resp 16 08/13/23 13:12  SpO2 100 % 08/13/23 13:12  Vitals shown include unfiled device data.  Last Pain:  Vitals:   08/13/23 1023  TempSrc:   PainSc: 0-No pain      Patients Stated Pain Goal: 3 (08/13/23 0945)  Complications: No notable events documented.

## 2023-08-13 NOTE — Discharge Instructions (Addendum)
 Post Operative Pain Med Plan:  >Take gabapentin  300 mg three times per day, as prescribed for 4 days, try to space them evenly  >Take the Toradol  every 8 hours for the first 3 days.  After the toradol  is gone switch to Ibuprofen  600mg  every 6 hours as needed.  DO NOT TAKE the Toradol  and Ibuprofen  together- take one or the other.  >In between the Toradol , take Tylenol  (over the counter) every 6 to 8 hours.  If the Tylenol  does not seem strong enough.  Take the tylenol  along with the oxycodone  every 6 hours If the oxycodone  seems to strong then just take Tylenol   >Oxycodone  will cause constipation, please be sure to take a stool softener (Colace) twice daily while taking this pain medication and/or continue this medication until your bowel regimen returns to normal  If possible try to take the Toradol  or Ibuprofen  with food to help avoid upsetting your stomach  >Use a heating pad as well as needed  >I have also sent a prescription for zofran  (ondansetron ) for nausea to take if needed over the first couple of days  >Be gentle with your diet the first few days, liquids and soft non spicy food, fruits are great  >Get up and move, no lifting or straining  HOME INSTRUCTIONS  Please note any unusual or excessive bleeding, pain, swelling. Mild dizziness or drowsiness are normal for about 24 hours after surgery.   Shower when comfortable  Restrictions: No driving for 24 hours or while taking pain medications.  Activity:  No heavy lifting (> 10 lbs), nothing in vagina (no tampons, douching, or intercourse) x 8 weeks; no tub baths for 8 weeks Vaginal spotting is expected but if your bleeding is heavy, period like,  please call the office   Incision: the bandaids will fall off when they are ready to; you may clean your incision with mild soap and water  but do not rub or scrub the incision site.  You may experience slight bloody drainage from your incision periodically.  This is normal.  If you  experience a large amount of drainage or the incision opens, please call your physician who will likely direct you to the emergency department.  Diet:  You may return to your regular diet.  Do not eat large meals.  Eat small frequent meals throughout the day.  Continue to drink a good amount of water  at least 6-8 glasses of water  per day, hydration is very important for the healing process.  Pain Management: Follow the instructions as above.  Always take prescription pain medication with food.  Oxycodone  may cause constipation, you may want to take a stool softener while taking this medication.  A prescription of colace has been sent in to take twice daily if needed while taking the oxycodone .  Be sure to drink plenty of fluids and increase your fiber to help with constipation.  Alcohol -- Avoid for 24 hours and while taking pain medications.  Nausea: Take sips of ginger ale or soda  Fever -- Call physician if temperature over 101 degrees  Follow up:  If you do not already have a follow up appointment scheduled, please call the office at [phone].  If you experience fever (a temperature greater than 100.4), pain unrelieved by pain medication, shortness of breath, swelling of a single leg, or any other symptoms which are concerning to you please the office immediately.    Post Anesthesia Home Care Instructions  Activity: Get plenty of rest for the remainder  of the day. A responsible individual must stay with you for 24 hours following the procedure.  For the next 24 hours, DO NOT: -Drive a car -Advertising copywriter -Drink alcoholic beverages -Take any medication unless instructed by your physician -Make any legal decisions or sign important papers.  Meals: Start with liquid foods such as gelatin or soup. Progress to regular foods as tolerated. Avoid greasy, spicy, heavy foods. If nausea and/or vomiting occur, drink only clear liquids until the nausea and/or vomiting subsides. Call your  physician if vomiting continues.  Special Instructions/Symptoms: Your throat may feel dry or sore from the anesthesia or the breathing tube placed in your throat during surgery. If this causes discomfort, gargle with warm salt water . The discomfort should disappear within 24 hours.

## 2023-08-13 NOTE — Op Note (Signed)
 Pre Op Dx:   Dysmenorrhea Abnormal uterine bleeding  Post Op Dx:   Same and Endometriosis  Procedure:   Robotic Assisted Total Laparoscopic Hysterectomy and Bilateral Salpingectomy and ablation of endometriosis   Surgeon:  Dr. Delon Prude Anesthesia:  general   EBL:  35cc  IVF:  1500cc UOP:  300cc   Drains:  Foley catheter Specimen removed:  Uterus  with cervix and bilateral fallopian tubes Findings:  Multiple clear endometriotic blebs noted throughout the pelvis scattered in posterior cul-de-sac, uterosacral ligaments, uterus and fallopian tubes.  Some fimly adhesions noted between right adnexa and lateral side wall.  Complications: None  Description of procedure:  After informed consent the patient was taken to the operating room and placed in dorsal supine position where general endotracheal anesthesia was administered and found to be adequate.  She was placed in dorsal lithotomy position with her arms tucked.  She was prepped and draped in the usual sterile fashion.  A timeout was called and the procedure confirmed.  A V-care uterine manipulator with the cup and a Foley catheter were placed.   An incision was made in the supraumbilical area and the Veress needle was inserted into the abdominal cavity without difficulty. Proper placement was confirmed using the saline drop test and opening pressure was 4 mmHg. A pneumoperitoneum was obtained. The laparoscopic trocar and the laparoscope were placed under direct visualization. Two 7mm ports were placed on either side of the umbilicus and an 8mm port was placed in the left upper quadrant under direct visualization.  The patient was placed in Trendelenburg position and the [employer] robotic device was docked.  Next, attention was turned to the console where the hysterectomy was performed.  The filmy right adnexal adhesions were taken down using the vessel sealer.  The right fallopian tube was divided at the mesosalpinx and the uteroovarian  anastamosis was divided and the right round ligament was divided.   This process was repeated on the contralateral side.  The anterior leaflet of the broad ligament was divided to create a bladder flap.  The uterine artery and vein were skeletonized and desiccated superior to the Koh cup.  This process was repeated on the contralateral side.  A small anterior and posterior colpotomy incision was made to mark the cup, The colpotomy was completed along the ridge of the cup and the uterus was passed off the field. The vaginal occluder was placed in the vagina to maintain pneumoperitoneum.  The vaginal cuff was then closed with V-loc suture in a double layer fashion. Ureters with peristalsis were visualized bilaterally.  Hemostasis confirmed. A sponge stick was placed to elevated the vaginal cuff.  Using fenestrated bipolar, multiple areas of endometriosis were ablated in the posterior cul-de-sac, along left uterosacral ligament and right adnexa.  Hemostasis was noted.  Surgicel was placed on the vaginal cuff and adnexa bilaterally.  The [employer] robotic device was undocked.  Under direct visualization TAP block was completed under direct visualization using 10cc of 0.25% marcaine  in each of four locations.  Airseal was deflated and trocars were removed.The skin was closed with 4-0 Vicryl in subcuticular fashion with skin glue placed atop each port site.    The patient was returned to dorsal supine position, awakened and extubated in the OR having appeared to tolerate the procedure well.  All sponge, needle, and instrument counts were correct x 2 at the end of the case.  Pt tolerated procedure well and was taken to recovery in stable condition.  Eryca Bolte, DO Attending Obstetrician & Gynecologist, [facility] for [employer], [facility]

## 2023-08-13 NOTE — Anesthesia Procedure Notes (Signed)
 Procedure Name: Intubation Date/Time: 08/13/2023 10:43 AM  Performed by: Augusta Daved SAILOR, CRNAPre-anesthesia Checklist: Patient identified, Emergency Drugs available, Suction available and Patient being monitored Patient Re-evaluated:Patient Re-evaluated prior to induction Oxygen Delivery Method: Circle System Utilized Preoxygenation: Pre-oxygenation with 100% oxygen Induction Type: IV induction Ventilation: Mask ventilation without difficulty Laryngoscope Size: Miller and 2 Grade View: Grade I Tube type: Oral Number of attempts: 1 Airway Equipment and Method: Stylet and Oral airway Placement Confirmation: ETT inserted through vocal cords under direct vision, positive ETCO2 and breath sounds checked- equal and bilateral Secured at: 22 cm Tube secured with: Tape Dental Injury: Teeth and Oropharynx as per pre-operative assessment  Comments: No change to dentition after dl/intubation.

## 2023-08-13 NOTE — Anesthesia Preprocedure Evaluation (Signed)
 Anesthesia Evaluation  Patient identified by MRN, date of birth, ID band Patient awake    Reviewed: Allergy & Precautions, H&P , NPO status , Patient's Chart, lab work & pertinent test results, reviewed documented beta blocker date and time   Airway Mallampati: II  TM Distance: >3 FB Neck ROM: full    Dental no notable dental hx.    Pulmonary neg pulmonary ROS, former smoker   Pulmonary exam normal breath sounds clear to auscultation       Cardiovascular Exercise Tolerance: Good hypertension, negative cardio ROS  Rhythm:regular Rate:Normal     Neuro/Psych  Headaches negative neurological ROS  negative psych ROS   GI/Hepatic negative GI ROS, Neg liver ROS,,,  Endo/Other  negative endocrine ROSHypothyroidism    Renal/GU negative Renal ROS  negative genitourinary   Musculoskeletal   Abdominal   Peds  Hematology negative hematology ROS (+)   Anesthesia Other Findings   Reproductive/Obstetrics negative OB ROS                              Anesthesia Physical Anesthesia Plan  ASA: 2  Anesthesia Plan: General and General ETT   Post-op Pain Management:    Induction:   PONV Risk Score and Plan: Ondansetron   Airway Management Planned:   Additional Equipment:   Intra-op Plan:   Post-operative Plan:   Informed Consent: I have reviewed the patients History and Physical, chart, labs and discussed the procedure including the risks, benefits and alternatives for the proposed anesthesia with the patient or authorized representative who has indicated his/her understanding and acceptance.     Dental Advisory Given  Plan Discussed with: CRNA  Anesthesia Plan Comments:         Anesthesia Quick Evaluation

## 2023-08-14 ENCOUNTER — Encounter (HOSPITAL_COMMUNITY): Payer: Self-pay | Admitting: Obstetrics & Gynecology

## 2023-08-14 NOTE — Anesthesia Postprocedure Evaluation (Signed)
 Anesthesia Post Note  Patient: Nicole Freeman  Procedure(s) Performed: HYSTERECTOMY, TOTAL, ROBOT-ASSISTED, LAPAROSCOPIC, WITH BILATERAL SALPINGECTOMY. (Bilateral: Abdomen) ABLATION, ENDOMETRIOSIS, LAPAROSCOPIC (Abdomen)  Patient location during evaluation: Phase II Anesthesia Type: General Level of consciousness: awake Pain management: pain level controlled Vital Signs Assessment: post-procedure vital signs reviewed and stable Respiratory status: spontaneous breathing and respiratory function stable Cardiovascular status: blood pressure returned to baseline and stable Postop Assessment: no headache and no apparent nausea or vomiting Anesthetic complications: no Comments: Late entry   No notable events documented.   Last Vitals:  Vitals:   08/13/23 1415 08/13/23 1431  BP: 114/72   Pulse: 73 81  Resp: 16 16  Temp:  36.9 C  SpO2: 97% 100%    Last Pain:  Vitals:   08/14/23 1215  TempSrc:   PainSc: 0-No pain                 Yvonna JINNY Bosworth

## 2023-08-15 LAB — SURGICAL PATHOLOGY

## 2023-08-17 ENCOUNTER — Ambulatory Visit: Payer: Self-pay | Admitting: Obstetrics & Gynecology

## 2023-08-21 ENCOUNTER — Encounter: Payer: Self-pay | Admitting: Obstetrics & Gynecology

## 2023-08-21 ENCOUNTER — Ambulatory Visit: Admitting: Obstetrics & Gynecology

## 2023-08-21 VITALS — BP 110/72 | HR 80 | Ht 62.0 in | Wt 179.2 lb

## 2023-08-21 DIAGNOSIS — Z4889 Encounter for other specified surgical aftercare: Secondary | ICD-10-CM

## 2023-08-21 DIAGNOSIS — N809 Endometriosis, unspecified: Secondary | ICD-10-CM

## 2023-08-21 NOTE — Progress Notes (Signed)
    PostOp Visit Note  Nicole Freeman is a 28 y.o. [id number] female who presents for a postoperative visit. She is 1 week postop following a robotic assisted laparoscopic hysterectomy, bilateral salpingectomy completed on 7/8 as well as ablation of endometriosis  Today she notes she is doing well.  It has been several days since she has needed her oxycodone .  She has occasional cramping mostly at night.  A few days ago noted some light spotting when she wiped, but otherwise notes no vaginal bleeding.  Denies vaginal discharge. Denies fever or chills.  Tolerating gen diet.  +Flatus, Regular BMs.   Overall doing well and reports no acute complaints   Review of Systems Pertinent items are noted in HPI.    Objective:  BP 110/72 (BP Location: Right Arm, Patient Position: Sitting, Cuff Size: Normal)   Pulse 80   Ht 5' 2 (1.575 m)   Wt 179 lb 3.2 oz (81.3 kg)   LMP 07/25/2023 Comment: Urine Pregnancy test negative as of 08/08/23.  BMI 32.78 kg/m    Physical Examination:  GENERAL ASSESSMENT: well developed and well nourished SKIN: normal color, no lesions CHEST: normal air exchange, respiratory effort normal with no retractions HEART: regular rate and rhythm ABDOMEN: soft, non-distended, +BS INCISION: Healing appropriately, clean dry and intact EXTREMITY: no edema, no calf tenderness bilaterally PSYCH: mood appropriate, normal affect       Assessment:   1 wk postop   Plan:   -Meeting milestones appropriately - Reviewed pelvic rest for another 7 weeks - Reviewed restrictions - Okay to return to driving and some limited activity - Reviewed surgical findings and pathology, that confirmed endometriosis Delon Prude, DO Attending Obstetrician & Gynecologist, [facility] [employer], [facility]

## 2023-09-01 ENCOUNTER — Encounter: Payer: Self-pay | Admitting: Obstetrics & Gynecology

## 2023-09-30 ENCOUNTER — Ambulatory Visit
Admission: RE | Admit: 2023-09-30 | Discharge: 2023-09-30 | Disposition: A | Discharge status: Home / Self Care | Source: Ambulatory Visit | Attending: Nurse Practitioner | Admitting: Nurse Practitioner

## 2023-09-30 VITALS — BP 116/66 | HR 90 | Temp 98.4°F | Resp 18

## 2023-09-30 DIAGNOSIS — Z8719 Personal history of other diseases of the digestive system: Secondary | ICD-10-CM | POA: Diagnosis present

## 2023-09-30 DIAGNOSIS — R399 Unspecified symptoms and signs involving the genitourinary system: Secondary | ICD-10-CM | POA: Insufficient documentation

## 2023-09-30 DIAGNOSIS — R52 Pain, unspecified: Secondary | ICD-10-CM | POA: Insufficient documentation

## 2023-09-30 DIAGNOSIS — R109 Unspecified abdominal pain: Secondary | ICD-10-CM | POA: Diagnosis present

## 2023-09-30 LAB — POCT URINE DIPSTICK
Bilirubin, UA: NEGATIVE
Blood, UA: NEGATIVE
Glucose, UA: NEGATIVE mg/dL
Ketones, POC UA: NEGATIVE mg/dL
Nitrite, UA: NEGATIVE
POC PROTEIN,UA: NEGATIVE
Spec Grav, UA: 1.01 (ref 1.010–1.025)
Urobilinogen, UA: 0.2 U/dL
pH, UA: 5.5 (ref 5.0–8.0)

## 2023-09-30 LAB — POC SOFIA SARS ANTIGEN FIA: SARS Coronavirus 2 Ag: NEGATIVE

## 2023-09-30 MED ORDER — NITROFURANTOIN MONOHYD MACRO 100 MG PO CAPS: 100.0000 mg | ORAL_CAPSULE | Freq: Two times a day (BID) | ORAL | 0 refills | Status: DC

## 2023-09-30 NOTE — Discharge Instructions (Addendum)
 A urine culture has been ordered.  You will be contacted when the results of the urine culture are received.  If the results of the culture are negative and you are continue to experience symptoms, please follow-up with your primary care physician for further evaluation. Take medication as prescribed. Increase fluids and allow for plenty of rest. You may take over-the-counter Tylenol  as needed for pain, fever, or general discomfort. Develop a toileting schedule that will allow you to urinate at least every 2 hours. Also recommend avoiding caffeine such as tea, soda, or coffee while symptoms persist. For your abdominal pain, continue stool softeners that you are currently taking.  Also recommend increasing the fruits and vegetables in your diet. I have made a referral for you to follow-up with gastroenterology.  You will need to call their office to make an appointment. Go to the emergency department immediately if you have any worsening abdominal pain with new symptoms of fever, chills, or other concerns. Follow-up as needed.

## 2023-09-30 NOTE — ED Provider Notes (Signed)
 [facility]    CSN: [id number] Arrival date & time: 09/30/23  9147      History   Chief Complaint Chief Complaint  Patient presents with   Generalized Body Aches    Right Side pain, body aches & chills, feel blah, felt like I was getting a uti then it went away and I started feeling like this. - Entered by patient   Dysuria    HPI Nicole Freeman is a 28 y.o. female.   The history is provided by the patient.   Patient presents for complaints of generalized bodyaches, chills, right sided flank pain, and dysuria.  Patient also complains of bilateral ear fullness and pressure. She also complains of abdominal pain, but reports this is normal for her as she has a history of chronic constipation.  She denies fever, chills, hematuria, urinary frequency, urgency, low back pain, or vaginal symptoms.  Patient states that this is normally how she feels prior to getting a UTI.  Patient reports that she had a hysterectomy earlier this year.  Patient states that her PCP did make a referral for her to see gastroenterology for her abdominal pain, but states that she never followed up.  Past Medical History:  Diagnosis Date   Chiari malformation type I (HCC)    Hashimoto's disease    Headache    Thyroid  activity decreased    Von Willebrand disease (HCC)     Patient Active Problem List   Diagnosis Date Noted   Endometriosis determined by laparoscopy 08/13/2023   Dysmenorrhea 08/13/2023   Abnormal uterine bleeding 08/13/2023   Nexplanon  insertion 06/25/2022   Encounter for IUD removal 06/25/2022   Migraines 02/23/2021   Chiari malformation type I (HCC)    Allergic contact dermatitis 05/12/2018   Von Willebrand disease 05/28/2017    Past Surgical History:  Procedure Laterality Date   ROBOTIC ASSISTED TOTAL HYSTERECTOMY WITH BILATERAL SALPINGO OOPHERECTOMY Bilateral 08/13/2023   Procedure: HYSTERECTOMY, TOTAL, ROBOT-ASSISTED, LAPAROSCOPIC, WITH BILATERAL  SALPINGECTOMY.;  Surgeon: Ozan, Jennifer, DO;  Location: [facility];  Service: Gynecology;  Laterality: Bilateral;   WISDOM TOOTH EXTRACTION      OB History     Gravida  3   Para  3   Term  3   Preterm      AB      Living  3      SAB      IAB      Ectopic      Multiple  0   Live Births  3            Home Medications    Prior to Admission medications   Medication Sig Start Date End Date Taking? Authorizing Provider  levothyroxine  (SYNTHROID ) 75 MCG tablet Take 1 tablet (75 mcg total) by mouth daily before breakfast. 06/25/23  Yes Reardon, Benton PARAS, NP  nitrofurantoin , macrocrystal-monohydrate, (MACROBID ) 100 MG capsule Take 1 capsule (100 mg total) by mouth 2 (two) times daily. 09/30/23  Yes Leath-Warren, Etta PARAS, NP  ondansetron  (ZOFRAN ) 4 MG tablet Take 1 tablet (4 mg total) by mouth every 8 (eight) hours as needed for nausea or vomiting. 08/13/23  Yes Ozan, Jennifer, DO  pantoprazole  (PROTONIX ) 40 MG tablet Take 1 tablet (40 mg total) by mouth daily. 10/01/22  Yes Leath-Warren, Etta PARAS, NP  tiZANidine  (ZANAFLEX ) 4 MG capsule Take 1 capsule (4 mg total) by mouth 3 (three) times daily as needed for muscle spasms. Do not drink alcohol or drive while taking this  medication. May cause drowsiness 02/21/23  Yes Stuart Vernell Norris, PA-C  acetaminophen  (TYLENOL ) 325 MG tablet Take 2 tablets (650 mg total) by mouth every 6 (six) hours as needed. 08/13/23   Ozan, Jennifer, DO  hydrOXYzine  (ATARAX ) 10 MG tablet Take 1 tablet (10 mg total) by mouth 2 (two) times daily as needed for anxiety. 08/08/23   Ozan, Jennifer, DO  ibuprofen  (ADVIL ) 600 MG tablet Take 1 tablet (600 mg total) by mouth every 6 (six) hours as needed. 08/13/23   Ozan, Jennifer, DO    Family History Family History  Problem Relation Age of Onset   Other Maternal Grandmother        malformed kidney   Diabetes Maternal Aunt    Breast cancer Maternal Aunt    Breast cancer Paternal Aunt    Lung cancer Paternal  Aunt     Social History Social History   Tobacco Use   Smoking status: Former    Current packs/day: 0.50    Types: Cigarettes   Smokeless tobacco: Never   Tobacco comments:    quit April 2019  Vaping Use   Vaping status: Every Day  Substance Use Topics   Alcohol use: Yes    Comment: once in a blue moon   Drug use: No     Allergies   Banana   Review of Systems Review of Systems Per HPI  Physical Exam Triage Vital Signs ED Triage Vitals  Encounter Vitals Group     BP 09/30/23 0918 116/66     Girls Systolic BP Percentile --      Girls Diastolic BP Percentile --      Boys Systolic BP Percentile --      Boys Diastolic BP Percentile --      Pulse Rate 09/30/23 0918 90     Resp 09/30/23 0918 18     Temp 09/30/23 0918 98.4 F (36.9 C)     Temp Source 09/30/23 0918 Oral     SpO2 09/30/23 0918 98 %     Weight --      Height --      Head Circumference --      Peak Flow --      Pain Score 09/30/23 0917 2     Pain Loc --      Pain Education --      Exclude from Growth Chart --    No data found.  Updated Vital Signs BP 116/66 (BP Location: Left Arm)   Pulse 90   Temp 98.4 F (36.9 C) (Oral)   Resp 18   LMP 07/25/2023 Comment: Urine Pregnancy test negative as of 08/08/23.  SpO2 98%   Visual Acuity Right Eye Distance:   Left Eye Distance:   Bilateral Distance:    Right Eye Near:   Left Eye Near:    Bilateral Near:     Physical Exam Vitals and nursing note reviewed.  Constitutional:      General: She is not in acute distress.    Appearance: Normal appearance.  HENT:     Head: Normocephalic.     Right Ear: Tympanic membrane, ear canal and external ear normal.     Left Ear: Tympanic membrane, ear canal and external ear normal.     Nose: Nose normal.     Mouth/Throat:     Mouth: Mucous membranes are moist.  Eyes:     Conjunctiva/sclera: Conjunctivae normal.     Pupils: Pupils are equal, round, and reactive to light.  Cardiovascular:  Rate and  Rhythm: Normal rate and regular rhythm.     Pulses: Normal pulses.     Heart sounds: Normal heart sounds.  Pulmonary:     Effort: Pulmonary effort is normal. No respiratory distress.     Breath sounds: Normal breath sounds. No stridor. No wheezing, rhonchi or rales.  Abdominal:     General: Bowel sounds are normal.     Palpations: Abdomen is soft.     Tenderness: There is abdominal tenderness in the right upper quadrant and left upper quadrant. There is right CVA tenderness.  Musculoskeletal:     Cervical back: Normal range of motion.  Skin:    General: Skin is warm and dry.  Neurological:     General: No focal deficit present.     Mental Status: She is alert and oriented to person, place, and time.  Psychiatric:        Mood and Affect: Mood normal.        Behavior: Behavior normal.      UC Treatments / Results  Labs (all labs ordered are listed, but only abnormal results are displayed) Labs Reviewed  POCT URINE DIPSTICK - Abnormal; Notable for the following components:      Result Value   Leukocytes, UA Small (1+) (*)    All other components within normal limits  POC SOFIA SARS ANTIGEN FIA - Normal  URINE CULTURE    EKG   Radiology No results found.  Procedures Procedures (including critical care time)  Medications Ordered in UC Medications - No data to display  Initial Impression / Assessment and Plan / UC Course  I have reviewed the triage vital signs and the nursing notes.  Pertinent labs & imaging results that were available during my care of the patient were reviewed by me and considered in my medical decision making (see chart for details).  Urinalysis is positive for leukocytes.  Patient also has right flank pain on exam.  Her vital signs are otherwise stable, she is in no acute distress.  COVID test was positive today.  Will treat patient empirically for possible UTI with Macrobid  100 mg, while urine culture result is pending.  Supportive care  recommendations were provided and discussed with the patient to include continuing fluids, rest, over-the-counter analgesics, developing a toileting schedule, and avoiding caffeine.  For patient's abdominal pain, referral was made to GI for further evaluation.  Patient was also given strict ER follow-up precautions for worsening abdominal pain.  Patient was in agreement with this plan of care and verbalizes understanding.  All questions were answered.  Patient stable for discharge.  Final Clinical Impressions(s) / UC Diagnoses   Final diagnoses:  UTI symptoms  Generalized body aches  Abdominal pain, unspecified abdominal location  History of constipation     Discharge Instructions      A urine culture has been ordered.  You will be contacted when the results of the urine culture are received.  If the results of the culture are negative and you are continue to experience symptoms, please follow-up with your primary care physician for further evaluation. Take medication as prescribed. Increase fluids and allow for plenty of rest. You may take over-the-counter Tylenol  as needed for pain, fever, or general discomfort. Develop a toileting schedule that will allow you to urinate at least every 2 hours. Also recommend avoiding caffeine such as tea, soda, or coffee while symptoms persist. For your abdominal pain, continue stool softeners that you are currently taking.  Also recommend  increasing the fruits and vegetables in your diet. I have made a referral for you to follow-up with gastroenterology.  You will need to call their office to make an appointment. Go to the emergency department immediately if you have any worsening abdominal pain with new symptoms of fever, chills, or other concerns. Follow-up as needed.     ED Prescriptions     Medication Sig Dispense Auth. Provider   nitrofurantoin , macrocrystal-monohydrate, (MACROBID ) 100 MG capsule Take 1 capsule (100 mg total) by mouth 2 (two)  times daily. 10 capsule Leath-Warren, Etta PARAS, NP      PDMP not reviewed this encounter.   Gilmer Etta PARAS, NP 09/30/23 1028

## 2023-09-30 NOTE — ED Triage Notes (Signed)
 Patient presents to the office for lower back pain,chills, and Dysuria x 3 days.  Patient states she has a history of getting UTI.

## 2023-10-02 LAB — URINE CULTURE

## 2023-10-04 ENCOUNTER — Ambulatory Visit (HOSPITAL_COMMUNITY): Payer: Self-pay

## 2023-10-08 ENCOUNTER — Encounter: Payer: Self-pay | Admitting: Gastroenterology

## 2023-10-14 ENCOUNTER — Ambulatory Visit: Admitting: Obstetrics & Gynecology

## 2023-10-14 ENCOUNTER — Encounter: Payer: Self-pay | Admitting: Obstetrics & Gynecology

## 2023-10-14 VITALS — BP 121/77 | Ht 63.0 in | Wt 179.0 lb

## 2023-10-14 DIAGNOSIS — Z4889 Encounter for other specified surgical aftercare: Secondary | ICD-10-CM

## 2023-10-14 DIAGNOSIS — Z48816 Encounter for surgical aftercare following surgery on the genitourinary system: Secondary | ICD-10-CM

## 2023-10-14 DIAGNOSIS — N809 Endometriosis, unspecified: Secondary | ICD-10-CM

## 2023-10-14 NOTE — Progress Notes (Signed)
    PostOp Visit Note  Nicole Freeman is a 28 y.o. [id number] female who presents for a postoperative visit. She is 8 weeks postop following a RAH, BS completed on 7/8 due to  dysmenorrhea- diagnosed with endometriosis during surgery.  Today she notes she is doing well.  Denies significant pelvic or abdominal pain.  She has noted in an occasional ovarian twinge with certain movement.  Additionally she has noted an occasional episode of very light spotting that would come and go.  The last episode was more than 2 weeks ago. Denies fever or chills.  Tolerating gen diet.  +Flatus, Regular BMs.  Overall doing well  Review of Systems Pertinent items are noted in HPI.    Objective:  BP 121/77 (BP Location: Left Arm, Patient Position: Sitting)   Ht 5' 3 (1.6 m)   Wt 179 lb (81.2 kg)   LMP 07/25/2023 Comment: Urine Pregnancy test negative as of 08/08/23.  BMI 31.71 kg/m    Physical Examination:  GENERAL ASSESSMENT: well developed and well nourished SKIN: normal color, no lesions CHEST: normal air exchange, respiratory effort normal with no retractions HEART: regular rate and rhythm ABDOMEN: soft, non-distended, no rebound or guarding INCISION: well healed GU: Normal external genitalia, vaginal pink mucosa, vaginal cuff appears well-healed no immediate abnormalities noted.  Bimanual exam vaginal cuff intact no defects or abnormalities small stitch palpated on left side EXTREMITY: no edema or calf tenderness bilaterally PSYCH: mood appropriate, normal affect       Assessment:    8wk postop visit   Plan:   -Recommendation for 1 more week of pelvic rest to hopefully complete absorption of suture - Patient may return to regular activity - Due to concerns regarding endometriosis will continue yearly exams to follow-up symptoms []  Should she consider to have pelvic pain, may consider low-dose OCP  Tedra Coppernoll, DO Attending Obstetrician & Gynecologist, Faculty  [facility] [employer], [facility]

## 2023-10-25 LAB — TSH: TSH: 3.99 u[IU]/mL (ref 0.450–4.500)

## 2023-10-25 LAB — T4, FREE: Free T4: 1.07 ng/dL (ref 0.82–1.77)

## 2023-10-30 ENCOUNTER — Encounter: Payer: Self-pay | Admitting: Nurse Practitioner

## 2023-10-30 ENCOUNTER — Ambulatory Visit (INDEPENDENT_AMBULATORY_CARE_PROVIDER_SITE_OTHER): Admitting: Nurse Practitioner

## 2023-10-30 VITALS — BP 110/68 | HR 80 | Ht 63.0 in | Wt 179.8 lb

## 2023-10-30 DIAGNOSIS — E063 Autoimmune thyroiditis: Secondary | ICD-10-CM | POA: Diagnosis not present

## 2023-10-30 MED ORDER — LEVOTHYROXINE SODIUM 88 MCG PO TABS: 88.0000 ug | ORAL_TABLET | Freq: Every day | ORAL | 1 refills | Status: DC

## 2023-10-30 NOTE — Progress Notes (Signed)
 10/30/2023     Endocrinology Follow Up Note    Subjective:    Patient ID: Nicole Freeman, female    DOB: July 27, 1995, PCP [name], PA-C.   Past Medical History:  Diagnosis Date   Chiari malformation type I (HCC)    Hashimoto's disease    Headache    Thyroid  activity decreased    Von Willebrand disease (HCC)     Past Surgical History:  Procedure Laterality Date   ROBOTIC ASSISTED TOTAL HYSTERECTOMY WITH BILATERAL SALPINGO OOPHERECTOMY Bilateral 08/13/2023   Procedure: HYSTERECTOMY, TOTAL, ROBOT-ASSISTED, LAPAROSCOPIC, WITH BILATERAL SALPINGECTOMY.;  Surgeon: Ozan, Jennifer, DO;  Location: [facility];  Service: Gynecology;  Laterality: Bilateral;   WISDOM TOOTH EXTRACTION      Social History   Socioeconomic History   Marital status: Married    Spouse name: Not on file   Number of children: 2   Years of education: Not on file   Highest education level: Not on file  Occupational History   Not on file  Tobacco Use   Smoking status: Former    Current packs/day: 0.50    Types: Cigarettes   Smokeless tobacco: Never   Tobacco comments:    quit April 2019  Vaping Use   Vaping status: Every Day  Substance and Sexual Activity   Alcohol use: Yes    Comment: once in a blue moon   Drug use: No   Sexual activity: Yes    Birth control/protection: None, Condom  Other Topics Concern   Not on file  Social History Narrative   Right Handed   Lives in a one story    Drinks Caffeine    Lives with husband   Social Drivers of Health   Financial Resource Strain: Low Risk  (06/01/2021)   Overall Financial Resource Strain (CARDIA)    Difficulty of Paying Living Expenses: Not hard at all  Food Insecurity: No Food Insecurity (06/01/2021)   Hunger Vital Sign    Worried About Running Out of Food in the Last Year: Never true    Ran Out of Food in the Last Year: Never true  Transportation Needs: No Transportation Needs (06/01/2021)   PRAPARE -  Administrator, Civil Service (Medical): No    Lack of Transportation (Non-Medical): No  Physical Activity: Insufficiently Active (06/01/2021)   Exercise Vital Sign    Days of Exercise per Week: 4 days    Minutes of Exercise per Session: 30 min  Stress: No Stress Concern Present (06/01/2021)   [name] of Occupational Health - Occupational Stress Questionnaire    Feeling of Stress : Not at all  Social Connections: Moderately Integrated (06/01/2021)   Social Connection and Isolation Panel    Frequency of Communication with Friends and Family: More than three times a week    Frequency of Social Gatherings with Friends and Family: Three times a week    Attends Religious Services: 1 to 4 times per year    Active Member of Clubs or Organizations: No    Attends Banker Meetings: Never    Marital Status: Married    Family History  Problem Relation Age of Onset   Other Maternal Grandmother        malformed kidney   Diabetes Maternal Aunt    Breast cancer Maternal Aunt    Breast cancer Paternal Aunt    Lung cancer Paternal Aunt     Outpatient Encounter Medications as of 10/30/2023  Medication Sig  hydrOXYzine  (ATARAX ) 10 MG tablet Take 1 tablet (10 mg total) by mouth 2 (two) times daily as needed for anxiety. (Patient taking differently: Take 10 mg by mouth 2 (two) times daily as needed for anxiety. As needed)   levothyroxine  (SYNTHROID ) 88 MCG tablet Take 1 tablet (88 mcg total) by mouth daily before breakfast.   ondansetron  (ZOFRAN ) 4 MG tablet Take 1 tablet (4 mg total) by mouth every 8 (eight) hours as needed for nausea or vomiting.   pantoprazole  (PROTONIX ) 40 MG tablet Take 1 tablet (40 mg total) by mouth daily.   tiZANidine  (ZANAFLEX ) 4 MG capsule Take 1 capsule (4 mg total) by mouth 3 (three) times daily as needed for muscle spasms. Do not drink alcohol or drive while taking this medication. May cause drowsiness   [DISCONTINUED] levothyroxine   (SYNTHROID ) 75 MCG tablet Take 1 tablet (75 mcg total) by mouth daily before breakfast.   acetaminophen  (TYLENOL ) 325 MG tablet Take 2 tablets (650 mg total) by mouth every 6 (six) hours as needed. (Patient not taking: Reported on 10/30/2023)   ibuprofen  (ADVIL ) 600 MG tablet Take 1 tablet (600 mg total) by mouth every 6 (six) hours as needed. (Patient not taking: Reported on 10/30/2023)   nitrofurantoin , macrocrystal-monohydrate, (MACROBID ) 100 MG capsule Take 1 capsule (100 mg total) by mouth 2 (two) times daily. (Patient not taking: Reported on 10/30/2023)   No facility-administered encounter medications on file as of 10/30/2023.    ALLERGIES: Allergies  Allergen Reactions   Banana Itching    Mouth and throat gets scratchy and itchy    VACCINATION STATUS: Immunization History  Administered Date(s) Administered   Tdap 06/01/2021     HPI  Nicole Freeman is 28 y.o. female who presents today with a medical history as above. she is being seen in consultation for hyperthyroidism requested by [name], PA-C.  she has been dealing with symptoms of palpitations, weight loss (without changing diet), anxiety, hot flashes, alternating constipation and diarrhea, tremors, heart burn, insomnia, inability to be full after eating, and mild intermittent dysphagia and sore throat for 4. These symptoms are progressively worsening and troubling to her.  her most recent thyroid  labs revealed suppressed TSH of 0.007 on 04/05/22.  She notes these symptoms all started about 4 years ago following the birth of her second child.  The symptoms have come and gone but recently have been worse following the birth of her third child.  she denies choking, shortness of breath, no recent voice change.    she denies known family history of thyroid  dysfunction and denies family hx of thyroid  cancer. she denies personal history of goiter. she is not on any anti-thyroid  medications nor on any thyroid   hormone supplements. Denies use of Biotin containing supplements.  she is willing to proceed with appropriate work up and therapy for thyrotoxicosis.  She has history of Von Willebrand disease, Chiari Malformation, and migraines.  She also notes she recently had her Mirena  IUD removed, and while she was waiting to have Nexplanon  inserted, she says her headaches went away and she felt the best she has in a long time.  After the Nexplanon  was inserted, her symptoms started to resurface once again.  She did have partial hysterectomy since last visit and she notes her symptoms have improved somewhat but are still lingering.   Review of systems  Constitutional: + Minimally fluctuating body weight,  current Body mass index is 31.85 kg/m. , no fatigue, no subjective hyperthermia, no subjective hypothermia Eyes:  no blurry vision, no xerophthalmia ENT: no sore throat, no nodules palpated in throat, no dysphagia/odynophagia, no hoarseness Cardiovascular: no chest pain, no shortness of breath, no palpitations, no leg swelling Respiratory: no cough, no shortness of breath Gastrointestinal: no nausea/vomiting/diarrhea, + constipation- seeing GI for this soon Musculoskeletal: no muscle/joint aches Skin: no rashes, no hyperemia Neurological: no tremors, no numbness, no tingling, no dizziness Psychiatric: no depression, no anxiety   Objective:    BP 110/68 (BP Location: Left Arm, Patient Position: Sitting, Cuff Size: Large)   Pulse 80   Ht 5' 3 (1.6 m)   Wt 179 lb 12.8 oz (81.6 kg)   LMP 07/25/2023 Comment: Urine Pregnancy test negative as of 08/08/23.  BMI 31.85 kg/m   Wt Readings from Last 3 Encounters:  10/30/23 179 lb 12.8 oz (81.6 kg)  10/14/23 179 lb (81.2 kg)  08/21/23 179 lb 3.2 oz (81.3 kg)     BP Readings from Last 3 Encounters:  10/30/23 110/68  10/14/23 121/77  09/30/23 116/66      Physical Exam- Limited  Constitutional:  Body mass index is 31.85 kg/m. , not in acute  distress, normal state of mind Eyes:  EOMI, no exophthalmos Musculoskeletal: no gross deformities, strength intact in all four extremities, no gross restriction of joint movements Skin:  no rashes, no hyperemia Neurological: no tremor with outstretched hands   CMP     Component Value Date/Time   NA 136 02/24/2023 1935   NA 135 (L) 01/06/2014 1059   K 3.7 02/24/2023 1935   K 4.3 01/06/2014 1059   CL 108 02/24/2023 1935   CL 102 01/06/2014 1059   CO2 21 (L) 02/24/2023 1935   CO2 28 01/06/2014 1059   GLUCOSE 101 (H) 02/24/2023 1935   GLUCOSE 126 (H) 01/06/2014 1059   BUN 18 02/24/2023 1935   BUN 14 01/06/2014 1059   CREATININE 0.78 02/24/2023 1935   CREATININE 0.88 01/06/2014 1059   CALCIUM 8.8 (L) 02/24/2023 1935   CALCIUM 9.8 01/06/2014 1059   PROT 7.8 02/24/2023 1935   PROT 8.2 01/06/2014 1059   ALBUMIN 4.5 02/24/2023 1935   ALBUMIN 4.4 01/06/2014 1059   AST 18 02/24/2023 1935   AST 21 01/06/2014 1059   ALT 12 02/24/2023 1935   ALT 20 01/06/2014 1059   ALKPHOS 38 02/24/2023 1935   ALKPHOS 70 01/06/2014 1059   BILITOT 0.7 02/24/2023 1935   BILITOT 0.8 01/06/2014 1059   GFRNONAA >60 02/24/2023 1935   GFRNONAA >60 01/06/2014 1059   GFRNONAA >60 03/02/2013 1347   GFRAA >60 05/21/2017 1341   GFRAA >60 01/06/2014 1059   GFRAA >60 03/02/2013 1347     CBC    Component Value Date/Time   WBC 5.1 08/08/2023 0942   RBC 4.03 08/08/2023 0942   HGB 11.9 (L) 08/08/2023 0942   HGB 10.7 (L) 06/01/2021 0806   HCT 36.3 08/08/2023 0942   HCT 32.1 (L) 06/01/2021 0806   PLT 299 08/08/2023 0942   PLT 263 06/01/2021 0806   MCV 90.1 08/08/2023 0942   MCV 94 06/01/2021 0806   MCV 97 01/06/2014 1059   MCH 29.5 08/08/2023 0942   MCHC 32.8 08/08/2023 0942   RDW 12.8 08/08/2023 0942   RDW 12.1 06/01/2021 0806   RDW 12.5 01/06/2014 1059   LYMPHSABS 1.2 02/23/2021 1345   LYMPHSABS 0.9 (L) 01/06/2014 1059   MONOABS 1.1 (H) 08/10/2016 2213   MONOABS 0.8 01/06/2014 1059   EOSABS 0.1  02/23/2021 1345   EOSABS 0.1  01/06/2014 1059   BASOSABS 0.0 02/23/2021 1345   BASOSABS 0.0 01/06/2014 1059     Diabetic Labs (most recent): No results found for: HGBA1C, MICROALBUR  Lipid Panel  No results found for: CHOL, TRIG, HDL, CHOLHDL, VLDL, LDLCALC, LDLDIRECT, LABVLDL   Lab Results  Component Value Date   TSH 3.990 10/24/2023   TSH 1.110 06/18/2023   TSH 2.100 03/18/2023   TSH 2.500 12/05/2022   TSH 2.600 08/27/2022   TSH 6.380 (H) 07/13/2022   TSH 14.300 (H) 05/16/2022   TSH 3.270 02/13/2018   TSH 2.03 10/31/2012   FREET4 1.07 10/24/2023   FREET4 0.99 06/18/2023   FREET4 0.98 03/18/2023   FREET4 0.85 12/05/2022   FREET4 0.91 08/27/2022   FREET4 1.17 07/13/2022   FREET4 0.65 (L) 05/16/2022     Thyroid  US  from 05/31/22 CLINICAL DATA:  Hypothyroid.   EXAM: THYROID  ULTRASOUND   TECHNIQUE: Ultrasound examination of the thyroid  gland and adjacent soft tissues was performed.   COMPARISON:  None Available.   FINDINGS: Parenchymal Echotexture: Moderately heterogenous   Isthmus: 0.4 cm   Right lobe: 4.2 x 1.8 x 1.9 cm   Left lobe: 5.1 x 1.5 x 1.7 cm   _________________________________________________________   Estimated total number of nodules >/= 1 cm: 0   Number of spongiform nodules >/=  2 cm not described below (TR1): 0   Number of mixed cystic and solid nodules >/= 1.5 cm not described below (TR2): 0   _________________________________________________________   No discrete nodules are seen within the thyroid  gland. Normal vascularity on color Doppler imaging.   IMPRESSION: Mildly enlarged and heterogeneous thyroid  gland most consistent with thyroiditis.   No discrete thyroid  nodules are evident. No significant hypervascularity.     Electronically Signed   By: Wilkie Lent M.D.   On: 05/31/2022 16:20     Latest Reference Range & Units 12/05/22 15:23 03/18/23 09:36 06/18/23 13:59 10/24/23 08:42  TSH 0.450 -  4.500 uIU/mL 2.500 2.100 1.110 3.990  Triiodothyronine,Free,Serum 2.0 - 4.4 pg/mL 2.9     T4,Free(Direct) 0.82 - 1.77 ng/dL 9.14 9.01 9.00 8.92      Assessment & Plan:   1. Abnormal TSH 2. Positive thyroid  antibodies  she is being seen at a kind request of [name], PA-C.  Her recent thyroid  labs show positive antibodies, indicating autoimmune thyroid  dysfunction.    Her previsit TFTs are consistent with slight under-replacement and she does have symptoms of such.  She is advised to increase her Levothyroxine  to 88 mcg po daily before breakfast.  Will recheck TFTs prior to next visit and adjust dose accordingly.     -Patient is advised to maintain close follow up with Jackson, Samantha J, PA-C for primary care needs.    I spent  26  minutes in the care of the patient today including review of labs from Thyroid  Function, CMP, and other relevant labs ; imaging/biopsy records (current and previous including abstractions from other facilities); face-to-face time discussing  her lab results and symptoms, medications doses, her options of short and long term treatment based on the latest standards of care / guidelines;   and documenting the encounter.  Obera Stauch Freeman  participated in the discussions, expressed understanding, and voiced agreement with the above plans.  All questions were answered to her satisfaction. she is encouraged to contact clinic should she have any questions or concerns prior to her return visit.  Follow up plan: Return in about 4 months (around 02/29/2024) for Thyroid   follow up, Previsit labs.   Thank you for involving me in the care of this pleasant patient, and I will continue to update you with her progress.   Benton Rio, [facility] [address] [place], [place] [address] Phone: [phone] Fax: [phone]  10/30/2023, 8:47 AM

## 2023-10-30 NOTE — Patient Instructions (Signed)

## 2023-11-12 ENCOUNTER — Ambulatory Visit
Admission: RE | Admit: 2023-11-12 | Discharge: 2023-11-12 | Disposition: A | Discharge status: Home / Self Care | Source: Ambulatory Visit | Attending: Nurse Practitioner | Admitting: Nurse Practitioner

## 2023-11-12 ENCOUNTER — Telehealth: Payer: Self-pay | Admitting: Nurse Practitioner

## 2023-11-12 ENCOUNTER — Ambulatory Visit (INDEPENDENT_AMBULATORY_CARE_PROVIDER_SITE_OTHER)

## 2023-11-12 VITALS — BP 112/76 | HR 73 | Temp 97.7°F | Resp 20

## 2023-11-12 DIAGNOSIS — M25511 Pain in right shoulder: Secondary | ICD-10-CM

## 2023-11-12 MED ORDER — PREDNISONE 20 MG PO TABS: 40.0000 mg | ORAL_TABLET | Freq: Every day | ORAL | 0 refills | Status: AC

## 2023-11-12 MED ORDER — DEXAMETHASONE SODIUM PHOSPHATE 10 MG/ML IJ SOLN
10.0000 mg | INTRAMUSCULAR | Status: AC
  Administered 2023-11-12: 10 mg via INTRAMUSCULAR

## 2023-11-12 MED ORDER — TIZANIDINE HCL 4 MG PO TABS: 4.0000 mg | ORAL_TABLET | Freq: Three times a day (TID) | ORAL | 0 refills | Status: AC | PRN

## 2023-11-12 NOTE — ED Provider Notes (Signed)
 [facility]    CSN: [id number] Arrival date & time: 11/12/23  9147      History   Chief Complaint Chief Complaint  Patient presents with   Shoulder Pain    Neck and shoulder pain, pulled muscle - Entered by patient    HPI Nicole Freeman is a 28 y.o. female.   The history is provided by the patient.   Patient presents with a 2-week history of right shoulder pain.  States symptoms have been ongoing but have become worse over the past 2 weeks.  Patient states that she has not experienced any specific injury or trauma, states that she has been using it more since symptoms started.  Patient reports that she has pain that radiates into the right side of her neck and down the right arm.  She denies numbness, tingling, or decreased range of motion.  Patient states that she has been seeing a [facility].  States that she saw the chiropractor yesterday.  Patient reports she has used muscle relaxers in the past, states that she did reach out to her PCP regarding a refill for her muscle relaxer.  Past Medical History:  Diagnosis Date   Chiari malformation type I (HCC)    Hashimoto's disease    Headache    Thyroid  activity decreased    Von Willebrand disease (HCC)     Patient Active Problem List   Diagnosis Date Noted   Endometriosis determined by laparoscopy 08/13/2023   Dysmenorrhea 08/13/2023   Abnormal uterine bleeding 08/13/2023   Nexplanon  insertion 06/25/2022   Encounter for IUD removal 06/25/2022   Migraines 02/23/2021   Chiari malformation type I (HCC)    Allergic contact dermatitis 05/12/2018   Von Willebrand disease 05/28/2017    Past Surgical History:  Procedure Laterality Date   ROBOTIC ASSISTED TOTAL HYSTERECTOMY WITH BILATERAL SALPINGO OOPHERECTOMY Bilateral 08/13/2023   Procedure: HYSTERECTOMY, TOTAL, ROBOT-ASSISTED, LAPAROSCOPIC, WITH BILATERAL SALPINGECTOMY.;  Surgeon: Ozan, Jennifer, DO;  Location: [facility];  Service: Gynecology;   Laterality: Bilateral;   WISDOM TOOTH EXTRACTION      OB History     Gravida  3   Para  3   Term  3   Preterm      AB      Living  3      SAB      IAB      Ectopic      Multiple  0   Live Births  3            Home Medications    Prior to Admission medications   Medication Sig Start Date End Date Taking? Authorizing Provider  acetaminophen  (TYLENOL ) 325 MG tablet Take 2 tablets (650 mg total) by mouth every 6 (six) hours as needed. Patient not taking: Reported on 10/30/2023 08/13/23   Ozan, Jennifer, DO  hydrOXYzine  (ATARAX ) 10 MG tablet Take 1 tablet (10 mg total) by mouth 2 (two) times daily as needed for anxiety. Patient taking differently: Take 10 mg by mouth 2 (two) times daily as needed for anxiety. As needed 08/08/23   Ozan, Jennifer, DO  ibuprofen  (ADVIL ) 600 MG tablet Take 1 tablet (600 mg total) by mouth every 6 (six) hours as needed. Patient not taking: Reported on 10/30/2023 08/13/23   Ozan, Jennifer, DO  levothyroxine  (SYNTHROID ) 88 MCG tablet Take 1 tablet (88 mcg total) by mouth daily before breakfast. 10/30/23   Therisa Benton PARAS, NP  nitrofurantoin , macrocrystal-monohydrate, (MACROBID ) 100 MG capsule Take 1 capsule (100  mg total) by mouth 2 (two) times daily. Patient not taking: Reported on 10/30/2023 09/30/23   Leath-Warren, Etta PARAS, NP  ondansetron  (ZOFRAN ) 4 MG tablet Take 1 tablet (4 mg total) by mouth every 8 (eight) hours as needed for nausea or vomiting. 08/13/23   Ozan, Jennifer, DO  pantoprazole  (PROTONIX ) 40 MG tablet Take 1 tablet (40 mg total) by mouth daily. 10/01/22   Leath-Warren, Etta PARAS, NP  tiZANidine  (ZANAFLEX ) 4 MG capsule Take 1 capsule (4 mg total) by mouth 3 (three) times daily as needed for muscle spasms. Do not drink alcohol or drive while taking this medication. May cause drowsiness 02/21/23   Stuart Vernell Norris, PA-C    Family History Family History  Problem Relation Age of Onset   Other Maternal Grandmother         malformed kidney   Diabetes Maternal Aunt    Breast cancer Maternal Aunt    Breast cancer Paternal Aunt    Lung cancer Paternal Aunt     Social History Social History   Tobacco Use   Smoking status: Former    Current packs/day: 0.50    Types: Cigarettes   Smokeless tobacco: Never   Tobacco comments:    quit April 2019  Vaping Use   Vaping status: Every Day  Substance Use Topics   Alcohol use: Yes    Comment: once in a blue moon   Drug use: No     Allergies   Banana   Review of Systems Review of Systems Per HPI  Physical Exam Triage Vital Signs ED Triage Vitals  Encounter Vitals Group     BP 11/12/23 0900 112/76     Girls Systolic BP Percentile --      Girls Diastolic BP Percentile --      Boys Systolic BP Percentile --      Boys Diastolic BP Percentile --      Pulse Rate 11/12/23 0900 73     Resp 11/12/23 0900 20     Temp 11/12/23 0900 97.7 F (36.5 C)     Temp Source 11/12/23 0900 Oral     SpO2 11/12/23 0900 98 %     Weight --      Height --      Head Circumference --      Peak Flow --      Pain Score 11/12/23 0903 3     Pain Loc --      Pain Education --      Exclude from Growth Chart --    No data found.  Updated Vital Signs BP 112/76 (BP Location: Right Arm)   Pulse 73   Temp 97.7 F (36.5 C) (Oral)   Resp 20   LMP 07/25/2023 Comment: Urine Pregnancy test negative as of 08/08/23.  SpO2 98%   Visual Acuity Right Eye Distance:   Left Eye Distance:   Bilateral Distance:    Right Eye Near:   Left Eye Near:    Bilateral Near:     Physical Exam Vitals and nursing note reviewed.  Constitutional:      General: She is not in acute distress.    Appearance: Normal appearance.  HENT:     Head: Normocephalic.  Eyes:     Extraocular Movements: Extraocular movements intact.     Pupils: Pupils are equal, round, and reactive to light.  Pulmonary:     Effort: Pulmonary effort is normal.  Musculoskeletal:     Right shoulder: Tenderness  present. No swelling  or deformity. Normal range of motion. Normal strength. Normal pulse.     Cervical back: No edema, erythema, signs of trauma or torticollis. Pain with movement and muscular tenderness present. Decreased range of motion.     Comments: Tenderness noted to the trapezius, rhomboid, supraspinatus and infraspinatus muscles.  There is no bruising, swelling, or deformity present.  Skin:    General: Skin is warm and dry.  Neurological:     General: No focal deficit present.     Mental Status: She is alert and oriented to person, place, and time.  Psychiatric:        Mood and Affect: Mood normal.        Behavior: Behavior normal.      UC Treatments / Results  Labs (all labs ordered are listed, but only abnormal results are displayed) Labs Reviewed - No data to display  EKG   Radiology No results found.  Procedures Procedures (including critical care time)  Medications Ordered in UC Medications  dexamethasone  (DECADRON ) injection 10 mg (10 mg Intramuscular Given 11/12/23 0926)    Initial Impression / Assessment and Plan / UC Course  I have reviewed the triage vital signs and the nursing notes.  Pertinent labs & imaging results that were available during my care of the patient were reviewed by me and considered in my medical decision making (see chart for details).  X-ray results of the right shoulder are pending.  On exam, patient has tenderness in the right shoulder, particularly the infraspinatus, supraspinatus, trapezius and rhomboid muscles.  Cannot rule out shoulder sprain at this time.  She does report that she has pain that radiates down to the right hand.  Will cover for inflammation and.  Patient was given Decadron  10 mg IM.  Will start patient on prednisone  40 mg for the next 5 days along with tizanidine  4 mg as a muscle relaxer.  Supportive care recommendations were provided and discussed with the patient to include over-the-counter analgesics such as  Tylenol , and RICE therapy.  Also emphasized the importance of stretching.  Referral was placed for orthopedics as this is an ongoing problem for the patient.  Patient advised to follow-up if symptoms fail to improve.  Patient was in agreement with this plan of care and verbalizes understanding.  All questions were answered.  Patient stable for discharge.  Update: 11:26 AM: Received patient's results of the right shoulder.  Called patient to discuss x-ray results.  X-ray was negative for fracture or dislocation.  Advised patient we will continue with current treatment plan and recommendations.  Patient advised to follow-up with orthopedics if symptoms fail to improve.  Patient was in agreement with this plan of care and verbalized understanding.  All questions were answered.  Final Clinical Impressions(s) / UC Diagnoses   Final diagnoses:  Right shoulder pain, unspecified chronicity   Discharge Instructions   None    ED Prescriptions   None    PDMP not reviewed this encounter.   Gilmer Etta PARAS, NP 11/12/23 1126

## 2023-11-12 NOTE — Telephone Encounter (Signed)
 Received patient's x-ray results of the right shoulder.  Called patient to discuss results, spoke with patient, verified patient with 2 patient identifiers.  Patient was advised that the results of the x-ray are negative for fracture or dislocation or signs of degeneration.  Advised patient we will continue with current treatment plan.  Advised patient if symptoms continue to recur or fail to improve, recommend follow-up with orthopedics, advised patient that referral was placed today during her appointment.  Patient was in agreement with this plan of care and verbalized understanding.  All questions were answered.

## 2023-11-12 NOTE — ED Triage Notes (Signed)
 Pt reports she has right shoulder pain x 2 weeks. States at times it tingles down her arm   Went to a chiropractor about 5 times in the last two weeks

## 2023-11-12 NOTE — Discharge Instructions (Addendum)
 Your x-ray result is pending.  You will be contacted when the results of the x-ray are received.  You will also have access to the results via MyChart. You were given an injection of Decadron  10 mg.  Start the prednisone  tomorrow.  Do not take any additional NSAIDs today such as Advil , Aleve, ibuprofen , Motrin , or naproxen.  You may take Tylenol  for breakthrough pain or discomfort. Take medication as prescribed.  While you are taking the prednisone , continue to take Tylenol . Recommend the use of ice or heat.  Apply ice for pain or swelling, heat for spasm or stiffness.  Apply for 20 minutes, remove for 1 hour, repeat as needed. Gentle range of motion exercises while symptoms persist. Try to avoid excessive use of the right upper extremity to include carrying heavy objects, or repetitive motion while symptoms persist. I have placed a referral for you to follow-up with orthopedics if symptoms continue to persist.  You will need to call to schedule an appointment. Follow-up as needed.

## 2023-11-20 ENCOUNTER — Ambulatory Visit (INDEPENDENT_AMBULATORY_CARE_PROVIDER_SITE_OTHER): Admitting: Gastroenterology

## 2023-11-20 ENCOUNTER — Encounter: Payer: Self-pay | Admitting: Gastroenterology

## 2023-11-20 VITALS — BP 116/76 | HR 84 | Temp 98.6°F | Ht 62.0 in | Wt 182.4 lb

## 2023-11-20 DIAGNOSIS — R1013 Epigastric pain: Secondary | ICD-10-CM | POA: Diagnosis not present

## 2023-11-20 DIAGNOSIS — K219 Gastro-esophageal reflux disease without esophagitis: Secondary | ICD-10-CM

## 2023-11-20 DIAGNOSIS — K5900 Constipation, unspecified: Secondary | ICD-10-CM

## 2023-11-20 DIAGNOSIS — G8929 Other chronic pain: Secondary | ICD-10-CM

## 2023-11-20 DIAGNOSIS — R1319 Other dysphagia: Secondary | ICD-10-CM | POA: Diagnosis not present

## 2023-11-20 NOTE — Patient Instructions (Signed)
 Continue pantoprazole  40mg  daily before breakfast. You can take second dose before supper if you are having persistent pain.  Add miralax two capfuls in at least 8 ounces of water  daily until you have a good soft stool, then you can stop colace and reduce miralax to one capful daily.  Please complete your labs. We will be in touch with results.  We will schedule you for an upper endoscopy in the near future.

## 2023-11-20 NOTE — Progress Notes (Signed)
 GI Office Note    Referring Provider: Etta Sierras, NP Primary Care Physician:  previously Lucie Mace, PA-C moved out of town Primary Gastroenterologist:  Chief Complaint   Chief Complaint  Patient presents with   Abdominal Pain    Has issues with abd pain, constipation and reflux.     History of Present Illness   Nicole Freeman is a 28 y.o. female presenting today at the request of Etta Sierras NP for abdominal pain and constipation.  Discussed the use of AI scribe software for clinical note transcription with the patient, who gave verbal consent to proceed.   She has been experiencing a burning pain in her upper abdomen since October of the previous year. The pain is sharp and worsens if she misses a dose of pantoprazole , which she has been taking since last October. The pain is sometimes accompanied by nausea and vomiting, as noted during a severe episode in January, where she vomited and had diarrhea for four hours. The patient reports that a CT scan was performed at that time, but she was told that nothing was found. Certain foods, particularly ground beef or venison, exacerbate her abdominal pain, while fast food cheeseburgers do not. She also experiences a burning sensation in her stomach when taking medications like Tylenol  or ibuprofen  on an empty stomach. Eating generally alleviates her symptoms.  Patient underwent surgery July 8 due to dysmenorrhea, diagnosed with endometriosis during surgery.  She underwent RAH and bilateral salpingectomy and ablation of endometriosis by Dr. Jennifer Ozan.       She has been struggling with constipation since her surgery. Using stool softener twice daily. Still having bristol 1 stools, moving bowels 1-2 times daily. Denies melena, brbpr.   She has a history of thyroid  issues that began after the birth of her second child six years ago. Her thyroid  medication was recently adjusted to help stabilize  her levels. She also has a history of Von Willebrand's disease. Recent hysterectomy without any bleeding concerns.   In her family history, her biological father had stomach ulcers, and her oldest son is borderline for celiac disease and may end up getting it. Wonder if he has genes c/w celiac. She is unaware of any family history of celiac disease, Crohn's disease, or colon cancer, but notes a history of breast cancer in her family. She reports that she is unaware of her father's side of the family with regards to medical history.       Prior Data   CT abdomen pelvis with contrast January 2025: IMPRESSION: 1. No acute intra-abdominal process. 2. Moderate hiatal hernia.   Medications   Current Outpatient Medications  Medication Sig Dispense Refill   docusate sodium  (COLACE) 100 MG capsule Take 100 mg by mouth 2 (two) times daily.     levothyroxine  (SYNTHROID ) 88 MCG tablet Take 1 tablet (88 mcg total) by mouth daily before breakfast. 90 tablet 1   pantoprazole  (PROTONIX ) 40 MG tablet Take 1 tablet (40 mg total) by mouth daily. 30 tablet 0   tiZANidine  (ZANAFLEX ) 4 MG tablet Take 1 tablet (4 mg total) by mouth every 8 (eight) hours as needed. 20 tablet 0   No current facility-administered medications for this visit.    Allergies   Allergies as of 11/20/2023 - Review Complete 11/20/2023  Allergen Reaction Noted   Banana Itching 12/11/2022    Past Medical History   Past Medical History:  Diagnosis Date   Chiari malformation type I (HCC)  Hashimoto's disease    Headache    Thyroid  activity decreased    Von Willebrand disease (HCC)     Past Surgical History   Past Surgical History:  Procedure Laterality Date   ROBOTIC ASSISTED TOTAL HYSTERECTOMY WITH BILATERAL SALPINGO OOPHERECTOMY Bilateral 08/13/2023   Procedure: HYSTERECTOMY, TOTAL, ROBOT-ASSISTED, LAPAROSCOPIC, WITH BILATERAL SALPINGECTOMY.;  Surgeon: Ozan, Jennifer, DO;  Location: [facility];  Service: Gynecology;   Laterality: Bilateral;   WISDOM TOOTH EXTRACTION      Past Family History   Family History  Problem Relation Age of Onset   Other Maternal Grandmother        malformed kidney   Diabetes Maternal Aunt    Breast cancer Maternal Aunt    Breast cancer Paternal Aunt    Lung cancer Paternal Aunt     Past Social History   Social History   Socioeconomic History   Marital status: Married    Spouse name: Not on file   Number of children: 2   Years of education: Not on file   Highest education level: Not on file  Occupational History   Not on file  Tobacco Use   Smoking status: Former    Current packs/day: 0.50    Types: Cigarettes   Smokeless tobacco: Never   Tobacco comments:    quit April 2019  Vaping Use   Vaping status: Every Day  Substance and Sexual Activity   Alcohol use: Yes    Comment: once in a blue moon   Drug use: No   Sexual activity: Yes    Birth control/protection: None, Condom  Other Topics Concern   Not on file  Social History Narrative   Right Handed   Lives in a one story    Drinks Caffeine    Lives with husband   Social Drivers of Health   Financial Resource Strain: Low Risk  (06/01/2021)   Overall Financial Resource Strain (CARDIA)    Difficulty of Paying Living Expenses: Not hard at all  Food Insecurity: No Food Insecurity (06/01/2021)   Hunger Vital Sign    Worried About Running Out of Food in the Last Year: Never true    Ran Out of Food in the Last Year: Never true  Transportation Needs: No Transportation Needs (06/01/2021)   PRAPARE - Administrator, Civil Service (Medical): No    Lack of Transportation (Non-Medical): No  Physical Activity: Insufficiently Active (06/01/2021)   Exercise Vital Sign    Days of Exercise per Week: 4 days    Minutes of Exercise per Session: 30 min  Stress: No Stress Concern Present (06/01/2021)   [name] of Occupational Health - Occupational Stress Questionnaire    Feeling of Stress : Not  at all  Social Connections: Moderately Integrated (06/01/2021)   Social Connection and Isolation Panel    Frequency of Communication with Friends and Family: More than three times a week    Frequency of Social Gatherings with Friends and Family: Three times a week    Attends Religious Services: 1 to 4 times per year    Active Member of Clubs or Organizations: No    Attends Banker Meetings: Never    Marital Status: Married  Catering Manager Violence: Not At Risk (06/01/2021)   Humiliation, Afraid, Rape, and Kick questionnaire    Fear of Current or Ex-Partner: No    Emotionally Abused: No    Physically Abused: No    Sexually Abused: No    Review of Systems  General: Negative for anorexia, weight loss, fever, chills, fatigue, weakness. Eyes: Negative for vision changes.  ENT: Negative for hoarseness, difficulty swallowing , nasal congestion. CV: Negative for chest pain, angina, palpitations, dyspnea on exertion, peripheral edema.  Respiratory: Negative for dyspnea at rest, dyspnea on exertion, cough, sputum, wheezing.  GI: See history of present illness. GU:  Negative for dysuria, hematuria, urinary incontinence, urinary frequency, nocturnal urination.  MS: Negative for joint pain, low back pain.  Derm: Negative for rash or itching.  Neuro: Negative for weakness, abnormal sensation, seizure, frequent headaches, memory loss,  confusion.  Psych: Negative for anxiety, depression, suicidal ideation, hallucinations.  Endo: Negative for unusual weight change.  Heme: Negative for bruising or bleeding. Allergy: Negative for rash or hives.  Physical Exam   BP 116/76 (BP Location: Right Arm, Patient Position: Sitting, Cuff Size: Normal)   Pulse 84   Temp 98.6 F (37 C) (Oral)   Ht 5' 2 (1.575 m)   Wt 182 lb 6.4 oz (82.7 kg)   LMP 07/25/2023 Comment: Urine Pregnancy test negative as of 08/08/23.  SpO2 98%   BMI 33.36 kg/m    General: Well-nourished, well-developed in no  acute distress.  Head: Normocephalic, atraumatic.   Eyes: Conjunctiva pink, no icterus. Mouth: Oropharyngeal mucosa moist and pink  Neck: Supple without thyromegaly, masses, or lymphadenopathy.  Lungs: Clear to auscultation bilaterally.  Heart: Regular rate and rhythm, no murmurs rubs or gallops.  Abdomen: Bowel sounds are normal,  nondistended, no hepatosplenomegaly or masses,  no abdominal bruits or hernia, no rebound or guarding.  Mild epigastric/left sided tenderness, lower abdominal tenderness Rectal: not performed Extremities: No lower extremity edema. No clubbing or deformities.  Neuro: Alert and oriented x 4 , grossly normal neurologically.  Skin: Warm and dry, no rash or jaundice.   Psych: Alert and cooperative, normal mood and affect.  Labs   Lab Results  Component Value Date   TSH 3.990 10/24/2023   Lab Results  Component Value Date   NA 136 02/24/2023   CL 108 02/24/2023   K 3.7 02/24/2023   CO2 21 (L) 02/24/2023   BUN 18 02/24/2023   CREATININE 0.78 02/24/2023   GFRNONAA >60 02/24/2023   CALCIUM 8.8 (L) 02/24/2023   ALBUMIN 4.5 02/24/2023   GLUCOSE 101 (H) 02/24/2023   Lab Results  Component Value Date   ALT 12 02/24/2023   AST 18 02/24/2023   ALKPHOS 38 02/24/2023   BILITOT 0.7 02/24/2023   Lab Results  Component Value Date   WBC 5.1 08/08/2023   HGB 11.9 (L) 08/08/2023   HCT 36.3 08/08/2023   MCV 90.1 08/08/2023   PLT 299 08/08/2023    Imaging Studies   DG Shoulder Right Result Date: 11/12/2023 CLINICAL DATA:  Right shoulder pain for several weeks without known injury. EXAM: RIGHT SHOULDER - 2+ VIEW COMPARISON:  None Available. FINDINGS: There is no evidence of fracture or dislocation. There is no evidence of arthropathy or other focal bone abnormality. Soft tissues are unremarkable. IMPRESSION: Negative. Electronically Signed   By: Lynwood Landy Raddle M.D.   On: 11/12/2023 10:20    Assessment/Plan:    Upper abdominal pain/dysphagia/GERD: Chronic  upper abdominal pain with dysphagia and gastroesophageal reflux since last October. Pain is burning and sharp, located in the upper abdomen, exacerbated by missed doses of pantoprazole , and improves with eating, suggesting a non-gallbladder etiology. Differential includes gastritis, H. pylori infection, eosinophilic gastritis, ulcer, or esophagitis. Dysphagia may indicate inflammation or stricture. Pantoprazole  has been effective  but symptoms are returning. - Continue pantoprazole  daily before breakfast. May use second dose before supper if needed.  - Order upper endoscopy with possible esophageal dilation. ASA 2.  I have discussed the risks, alternatives, benefits with regards to but not limited to the risk of reaction to medication, bleeding, infection, perforation and the patient is agreeable to proceed. Written consent to be obtained.  -she has history of Von Willebrand's disease, did well with recent hysterectomy. No issues with deliveries of her 3 children. To discuss with Dr. Shaaron. Patient has not require DDAVP in the past. Unfortunately her hematology records are not available.  -check labs including CMET, lipase, CBC  Chronic constipation Chronic constipation with hard stools, managed with stool softeners twice daily. Symptoms include infrequent bowel movements and discomfort, potentially related to dietary factors. Current regimen provides some relief but is not fully effective.   - Initiate Miralax, starting with two capfuls daily, then adjust to one capful daily once stools are adequately softened. - Continue current stool softeners until Miralax is effective.  Query family history of celiac disease: -Order celiac disease serology to evaluate for potential gluten sensitivity.   Von willebrand:  No prior issues with any of her deliveries (3) or recent hysterectomy. Never had to receive DDAVP  Sonny RAMAN. Ezzard, MHS, PA-C [facility]

## 2023-11-23 LAB — COMPREHENSIVE METABOLIC PANEL WITH GFR
ALT: 10 IU/L (ref 0–32)
AST: 15 IU/L (ref 0–40)
Albumin: 4.4 g/dL (ref 4.0–5.0)
Alkaline Phosphatase: 46 IU/L (ref 41–116)
BUN/Creatinine Ratio: 16 (ref 9–23)
BUN: 11 mg/dL (ref 6–20)
Bilirubin Total: 0.3 mg/dL (ref 0.0–1.2)
CO2: 22 mmol/L (ref 20–29)
Calcium: 9.3 mg/dL (ref 8.7–10.2)
Chloride: 102 mmol/L (ref 96–106)
Creatinine, Ser: 0.67 mg/dL (ref 0.57–1.00)
Globulin, Total: 2.2 g/dL (ref 1.5–4.5)
Glucose: 103 mg/dL — ABNORMAL HIGH (ref 70–99)
Potassium: 4.5 mmol/L (ref 3.5–5.2)
Sodium: 138 mmol/L (ref 134–144)
Total Protein: 6.6 g/dL (ref 6.0–8.5)
eGFR: 121 mL/min/1.73 (ref >59)

## 2023-11-23 LAB — CBC WITH DIFFERENTIAL/PLATELET
Basophils Absolute: 0 x10E3/uL (ref 0.0–0.2)
Basos: 0 %
EOS (ABSOLUTE): 0 x10E3/uL (ref 0.0–0.4)
Eos: 0 %
Hematocrit: 37.9 % (ref 34.0–46.6)
Hemoglobin: 12 g/dL (ref 11.1–15.9)
Immature Grans (Abs): 0.1 x10E3/uL (ref 0.0–0.1)
Immature Granulocytes: 1 %
Lymphocytes Absolute: 1.1 x10E3/uL (ref 0.7–3.1)
Lymphs: 12 %
MCH: 28.1 pg (ref 26.6–33.0)
MCHC: 31.7 g/dL (ref 31.5–35.7)
MCV: 89 fL (ref 79–97)
Monocytes Absolute: 0.2 x10E3/uL (ref 0.1–0.9)
Monocytes: 3 %
Neutrophils Absolute: 7.6 x10E3/uL — ABNORMAL HIGH (ref 1.4–7.0)
Neutrophils: 84 %
Platelets: 350 x10E3/uL (ref 150–450)
RBC: 4.27 x10E6/uL (ref 3.77–5.28)
RDW: 12.8 % (ref 11.7–15.4)
WBC: 9.1 x10E3/uL (ref 3.4–10.8)

## 2023-11-23 LAB — IGA: IgA/Immunoglobulin A, Serum: 158 mg/dL (ref 87–352)

## 2023-11-23 LAB — LIPASE: Lipase: 33 U/L (ref 14–72)

## 2023-11-23 LAB — TISSUE TRANSGLUTAMINASE, IGA: Transglutaminase IgA: <2 U/mL (ref 0–3)

## 2023-11-26 MED ORDER — PANTOPRAZOLE SODIUM 40 MG PO TBEC: 40.0000 mg | DELAYED_RELEASE_TABLET | Freq: Every day | ORAL | 2 refills | Status: AC

## 2023-11-26 NOTE — Telephone Encounter (Signed)
 Larraine, I have sent in prescription for pantoprazole  to [facility].  Sonny

## 2023-11-27 ENCOUNTER — Ambulatory Visit: Admitting: Orthopedic Surgery

## 2023-11-27 ENCOUNTER — Other Ambulatory Visit (INDEPENDENT_AMBULATORY_CARE_PROVIDER_SITE_OTHER): Payer: Self-pay

## 2023-11-27 VITALS — BP 118/79 | HR 93 | Ht 62.0 in | Wt 181.0 lb

## 2023-11-27 DIAGNOSIS — M542 Cervicalgia: Secondary | ICD-10-CM | POA: Diagnosis not present

## 2023-11-27 DIAGNOSIS — M792 Neuralgia and neuritis, unspecified: Secondary | ICD-10-CM

## 2023-11-27 NOTE — Progress Notes (Unsigned)
 New Patient Visit  Assessment: Nicole Freeman is a 28 y.o. female with the following: 1. Radicular pain in right arm   Plan: Nicole Freeman has pain in the right side of the neck with radiating pains into the right arm.  XR are negative.  I have recommended OT for her symptoms.  Medications as needed.  Return to clinic if not improving and we would consider an MRI.   Follow-up: Return if symptoms worsen or fail to improve.  Subjective:  Chief Complaint  Patient presents with   Neck Pain    R radiating down towards elbow for approx 8 mos. No injuries but recent child is heavier that previous.     History of Present Illness: Nicole Freeman is a 28 y.o. female who has been referred by  Etta Bar, NP for evaluation of right shoulder pain. She has pain radiating from the neck to the right hand.  This has been ongoing for months.  No specific injury.  She reports that her youngest child is a little heavier and may be contributing.  Medications have not helped.  No therapy.    Review of Systems: No fevers or chills No numbness or tingling No chest pain No shortness of breath No bowel or bladder dysfunction No GI distress No headaches   Medical History:  Past Medical History:  Diagnosis Date   Chiari malformation type I (HCC)    Hashimoto's disease    Headache    Thyroid  activity decreased    Von Willebrand disease (HCC)     Past Surgical History:  Procedure Laterality Date   ROBOTIC ASSISTED TOTAL HYSTERECTOMY WITH BILATERAL SALPINGO OOPHERECTOMY Bilateral 08/13/2023   Procedure: HYSTERECTOMY, TOTAL, ROBOT-ASSISTED, LAPAROSCOPIC, WITH BILATERAL SALPINGECTOMY.;  Surgeon: Ozan, Jennifer, DO;  Location: [facility];  Service: Gynecology;  Laterality: Bilateral;   WISDOM TOOTH EXTRACTION      Family History  Problem Relation Age of Onset   Other Maternal Grandmother        malformed kidney   Diabetes Maternal Aunt    Breast  cancer Maternal Aunt    Breast cancer Paternal Aunt    Lung cancer Paternal Aunt    Social History   Tobacco Use   Smoking status: Former    Current packs/day: 0.50    Types: Cigarettes   Smokeless tobacco: Never   Tobacco comments:    quit April 2019  Vaping Use   Vaping status: Every Day  Substance Use Topics   Alcohol use: Yes    Comment: once in a blue moon   Drug use: No    Allergies  Allergen Reactions   Banana Itching    Mouth and throat gets scratchy and itchy    Current Meds  Medication Sig   docusate sodium  (COLACE) 100 MG capsule Take 100 mg by mouth 2 (two) times daily.   levothyroxine  (SYNTHROID ) 88 MCG tablet Take 1 tablet (88 mcg total) by mouth daily before breakfast.   pantoprazole  (PROTONIX ) 40 MG tablet Take 1 tablet (40 mg total) by mouth daily before breakfast.   tiZANidine  (ZANAFLEX ) 4 MG tablet Take 1 tablet (4 mg total) by mouth every 8 (eight) hours as needed.    Objective: BP 118/79   Pulse 93   Ht 5' 2 (1.575 m)   Wt 181 lb (82.1 kg)   LMP 07/25/2023 Comment: Urine Pregnancy test negative as of 08/08/23.  BMI 33.11 kg/m   Physical Exam:  General: Alert and oriented. and No acute distress.  Gait: Normal gait.  Right shoulder without deformity.  Slightly restricted ROM in the neck. Good BUE strength.  Sensation intact.  Tenderness within the right trapezius.   IMAGING: I personally ordered and reviewed the following images  XR of the cervical spine were obtained in clinic today.  No comparison available.  No acute injury.  No anterolisthesis.  Well maintained disc height.  Normal alignment.  No bony lesions.   Impression: normal cervical spine XR   New Medications:  No orders of the defined types were placed in this encounter.     Oneil DELENA Horde, MD  11/29/2023 11:55 PM

## 2023-11-29 ENCOUNTER — Encounter: Payer: Self-pay | Admitting: Orthopedic Surgery

## 2023-12-01 ENCOUNTER — Ambulatory Visit: Payer: Self-pay | Admitting: Gastroenterology

## 2023-12-01 ENCOUNTER — Encounter: Payer: Self-pay | Admitting: Gastroenterology

## 2023-12-01 ENCOUNTER — Telehealth: Payer: Self-pay | Admitting: Gastroenterology

## 2023-12-01 DIAGNOSIS — D6800 Von Willebrand disease, unspecified: Secondary | ICD-10-CM

## 2023-12-01 NOTE — Telephone Encounter (Signed)
 Please let pt know that Dr. Shaaron is requesting she get labwork to document current von willebrand factor activity prior to getting EGD.

## 2023-12-02 NOTE — Telephone Encounter (Signed)
Lmom for return call.  

## 2023-12-03 NOTE — Telephone Encounter (Signed)
 Pt was made aware and verbalized understanding. Pt was instructed to go to [facility] to have blood work done.

## 2023-12-26 NOTE — Telephone Encounter (Signed)
 Will you ask patient if she is planning to complete lab so we can schedule her EGD?

## 2023-12-27 ENCOUNTER — Ambulatory Visit (HOSPITAL_COMMUNITY): Attending: Orthopedic Surgery | Admitting: Occupational Therapy

## 2023-12-27 ENCOUNTER — Encounter (HOSPITAL_COMMUNITY): Payer: Self-pay | Admitting: Occupational Therapy

## 2023-12-27 DIAGNOSIS — M25511 Pain in right shoulder: Secondary | ICD-10-CM | POA: Diagnosis present

## 2023-12-27 DIAGNOSIS — R29898 Other symptoms and signs involving the musculoskeletal system: Secondary | ICD-10-CM | POA: Diagnosis present

## 2023-12-27 DIAGNOSIS — M792 Neuralgia and neuritis, unspecified: Secondary | ICD-10-CM | POA: Insufficient documentation

## 2023-12-27 DIAGNOSIS — M542 Cervicalgia: Secondary | ICD-10-CM | POA: Diagnosis present

## 2023-12-27 NOTE — Therapy (Signed)
 OUTPATIENT OCCUPATIONAL THERAPY ORTHO EVALUATION  Patient Name: Nicole Freeman MRN: [id number] DOB:[birth year], 28 y.o., female Today's Date: 12/27/2023   END OF SESSION:  OT End of Session - 12/27/23 1104     Visit Number 1    Number of Visits 8    Date for Recertification  02/07/24    Authorization Type [employer]    Authorization Time Period Requesting 8 visits    Authorization - Visit Number 1    Authorization - Number of Visits 8    OT Start Time 0907    OT Stop Time 0957    OT Time Calculation (min) 50 min    Activity Tolerance Patient tolerated treatment well    Behavior During Therapy WFL for tasks assessed/performed          Past Medical History:  Diagnosis Date   Chiari malformation type I (HCC)    Hashimoto's disease    Headache    Thyroid  activity decreased    Von Willebrand disease (HCC)    Past Surgical History:  Procedure Laterality Date   ROBOTIC ASSISTED TOTAL HYSTERECTOMY WITH BILATERAL SALPINGO OOPHERECTOMY Bilateral 08/13/2023   Procedure: HYSTERECTOMY, TOTAL, ROBOT-ASSISTED, LAPAROSCOPIC, WITH BILATERAL SALPINGECTOMY.;  Surgeon: Ozan, Jennifer, DO;  Location: [facility];  Service: Gynecology;  Laterality: Bilateral;   WISDOM TOOTH EXTRACTION     Patient Active Problem List   Diagnosis Date Noted   Constipation 11/20/2023   Abdominal pain, chronic, epigastric 11/20/2023   GERD (gastroesophageal reflux disease) 11/20/2023   Esophageal dysphagia 11/20/2023   Endometriosis determined by laparoscopy 08/13/2023   Dysmenorrhea 08/13/2023   Abnormal uterine bleeding 08/13/2023   Nexplanon  insertion 06/25/2022   Encounter for IUD removal 06/25/2022   Migraines 02/23/2021   Chiari malformation type I (HCC)    Allergic contact dermatitis 05/12/2018   Von Willebrand disease 05/28/2017    PCP: Leonce Lukes, PA-C REFERRING PROVIDER: Onesimo Anes, MD   ONSET DATE: ~2 months  REFERRING DIAG:  M79.2 (ICD-10-CM) - Radicular pain in right arm   M54.2 (ICD-10-CM) - Cervicalgia   THERAPY DIAG:  Right shoulder pain, unspecified chronicity  Other symptoms and signs involving the musculoskeletal system  Cervicalgia  Rationale for Evaluation and Treatment: Rehabilitation  SUBJECTIVE:   SUBJECTIVE STATEMENT: I get excruciating pain and numbness in my arm Pt accompanied by: self  PERTINENT HISTORY: Nicole Freeman has pain in the right side of the neck with radiating pains into the right arm.  XR are negative. PMH significant for Chiari Malformation Type 1, Hashimoto's, s/p hysterectomy.   PRECAUTIONS: None  WEIGHT BEARING RESTRICTIONS: No  PAIN:  Are you having pain? Yes: NPRS scale: 1/10 Pain location: trapezius down to deltoid Pain description: aching, sharp, numb Aggravating factors: carrying heavier things Relieving factors: rest  FALLS: Has patient fallen in last 6 months? No  PLOF: Independent  PATIENT GOALS: To reduce pain.  NEXT MD VISIT: N/A  OBJECTIVE:   HAND DOMINANCE: Right  ADLs: Overall ADLs: Pt having severe pain and weakness noted when trying to hold and carry her daughter. Unable to tolerate carrying/lifting heavier items such as a case of water . Pt also reports difficulty sleeping due to pain and discomfort.   FUNCTIONAL OUTCOME MEASURES: Quick Dash: 34.09  UPPER EXTREMITY ROM:       Assessed in sitting, er/IR adducted  Active ROM Right eval  Shoulder flexion 149  Shoulder abduction 172  Shoulder internal rotation 90  Shoulder external rotation 64 *pain  (Blank rows = not tested)  Cervical ROM is full however pt has pulling sensation with L rotations and flexion.  UPPER EXTREMITY MMT:     Assessed in sitting, er/IR adducted  MMT Right eval  Shoulder flexion 4+/5  Shoulder abduction 4+/5  Shoulder internal rotation 5/5  Shoulder external rotation 5/5  (Blank rows = not tested)  SENSATION: Pt having numbness down the shoulder and scapula  EDEMA: No  swelling noted   OBSERVATIONS: Stiffness noted with all neck movements   TODAY'S TREATMENT:                                                                                                                              DATE:   12/27/23 -Cervical Stretching: extension/flexion, lateral rotations, lateral tilts, x10 w/ ~5 sec holds  -Shoulder A/ROM: flexion, abduction, protraction, horizontal abduction, er/IR, x10   PATIENT EDUCATION: Education details: Cervical Stretching Person educated: Patient Education method: Explanation, Demonstration, and Handouts Education comprehension: verbalized understanding and returned demonstration  HOME EXERCISE PROGRAM: 11/21: Cervical Stretching  GOALS: Goals reviewed with patient? Yes   SHORT TERM GOALS: Target date: 02/05/24  Pt will be provided with and educated on HEP to improve mobility in RUE required for use during ADL completion.   LONG TERM GOALS: Target date: 02/05/24  Pt will decrease pain in RUE to 3/10 or less to improve ability to sleep for 2+ consecutive hours without waking due to pain.   Goal status: INITIAL  2.  Pt will decrease RUE fascial restrictions to min amounts or less to improve mobility required for functional reaching tasks.   Goal status: INITIAL  3.  Pt will increase RUE A/ROM of flexion by 10 degrees to improve ability to use RUE when reaching overhead during dressing and bathing tasks.   Goal status: INITIAL  4.  Pt will increase RUE strength to 5/5 or greater to improve ability to use RUE when lifting or carrying items during meal preparation/housework/yardwork tasks.   Goal status: INITIAL  5.  Pt will return to highest level of function using RUE as dominant during functional task completion.   Goal status: INITIAL   ASSESSMENT:  CLINICAL IMPRESSION: Patient is a 28 y.o. female who was seen today for occupational therapy evaluation for R shoulder pain and cervicalgia. Pt presents with increased  pain and fascial restrictions, decreased ROM, strength, and functional use of the RUE.   PERFORMANCE DEFICITS: in functional skills including in functional skills including ADLs, IADLs, coordination, tone, ROM, strength, pain, fascial restrictions, muscle spasms, and UE functional use.  IMPAIRMENTS: are limiting patient from ADLs, IADLs, rest and sleep, work, play, leisure, and social participation.   COMORBIDITIES: has no other co-morbidities that affects occupational performance. Patient will benefit from skilled OT to address above impairments and improve overall function.  MODIFICATION OR ASSISTANCE TO COMPLETE EVALUATION: No modification of tasks or assist necessary to complete an evaluation.  OT OCCUPATIONAL PROFILE AND HISTORY: Detailed assessment: Review of records and additional review  of physical, cognitive, psychosocial history related to current functional performance.  CLINICAL DECISION MAKING: LOW - limited treatment options, no task modification necessary  REHAB POTENTIAL: Good  EVALUATION COMPLEXITY: Low      PLAN:  OT FREQUENCY: 2x/week  OT DURATION: 4 weeks  PLANNED INTERVENTIONS: 97168 OT Re-evaluation, 97535 self care/ADL training, 02889 therapeutic exercise, 97530 therapeutic activity, 97112 neuromuscular re-education, 97140 manual therapy, 97035 ultrasound, 97010 moist heat, 97032 electrical stimulation (manual), functional mobility training, energy conservation, coping strategies training, patient/family education, and DME and/or AE instructions  RECOMMENDED OTHER SERVICES: N/A  CONSULTED AND AGREED WITH PLAN OF CARE: Patient  PLAN FOR NEXT SESSION: Manual Therapy, Cervical ROM/stretching, Shoulder A/ROM  Valentin Nightingale, OTR/L [facility] [phone] [place], OT 01/14/2024, 11:06 AM   Managed Medicaid Authorization Request Treatment Start Date: 01/14/2024  Visit Dx Codes: M25.511, R29.898, M54.2  Functional Tool Score:  Annitta: 34.09  For all possible CPT codes, reference the Planned Interventions line above.     Check all conditions that are expected to impact treatment: {Conditions expected to impact treatment:None of these apply   If treatment provided at initial evaluation, no treatment charged due to lack of authorization.

## 2023-12-27 NOTE — Patient Instructions (Signed)
  1) AROM: Lateral Neck Flexion   Slowly tilt head toward one shoulder, then the other. Hold each position ____ seconds. Repeat ____ times per set. Do ____ sets per session. Do ____ sessions per day.  http://orth.exer.us/296   Copyright  VHI. All rights reserved.  2) AROM: Neck Extension   Bend head backward. Hold ____ seconds. Repeat ____ times per set. Do ____ sets per session. Do ____ sessions per day.  http://orth.exer.us/300   Copyright  VHI. All rights reserved.  3) AROM: Neck Flexion   Bend head forward. Hold ____ seconds. Repeat ____ times per set. Do ____ sets per session. Do ____ sessions per day.  http://orth.exer.us/298   Copyright  VHI. All rights reserved.  4) AROM: Neck Rotation   Turn head slowly to look over one shoulder, then the other. Hold each position ____ seconds. Repeat ____ times per set. Do ____ sets per session. Do ____ sessions per day.  http://orth.exer.us/294   Copyright  VHI. All rights reserved.

## 2023-12-30 NOTE — Telephone Encounter (Signed)
Lmom for return call.  

## 2023-12-31 NOTE — Telephone Encounter (Signed)
Lmom for return call.  

## 2023-12-31 NOTE — Telephone Encounter (Signed)
 Spoke with pt and she stated that she would try to get to the lab by Monday to have the lab completed.

## 2024-01-10 LAB — FACTOR 8 RISTOCETIN COFACTOR: Von Willebrand Factor: 97 % (ref 50–200)

## 2024-01-15 ENCOUNTER — Encounter (HOSPITAL_COMMUNITY): Payer: Self-pay

## 2024-01-15 ENCOUNTER — Ambulatory Visit (HOSPITAL_COMMUNITY): Admitting: Occupational Therapy

## 2024-01-17 ENCOUNTER — Encounter (HOSPITAL_COMMUNITY): Payer: Self-pay | Admitting: Occupational Therapy

## 2024-01-17 ENCOUNTER — Ambulatory Visit (HOSPITAL_COMMUNITY): Attending: Orthopedic Surgery | Admitting: Occupational Therapy

## 2024-01-17 DIAGNOSIS — M25511 Pain in right shoulder: Secondary | ICD-10-CM | POA: Insufficient documentation

## 2024-01-17 DIAGNOSIS — R29898 Other symptoms and signs involving the musculoskeletal system: Secondary | ICD-10-CM

## 2024-01-17 DIAGNOSIS — M542 Cervicalgia: Secondary | ICD-10-CM | POA: Diagnosis present

## 2024-01-17 NOTE — Patient Instructions (Signed)
Cervical Neck Stretches: Complete each stretch 3-5x, holding for 5-10 seconds. 1-2x/day  1) Trapezius Stretch: Begin by sitting up straight.  Rotate your chin toward your shoulder. Keeping this rotation, side bend to the right, with your right ear coming toward your opposite shoulder. Gently  grab your head  with your right arm and apply slight pressure. You will feel this stretch down the side of your neck.              2) Levator Scapulae Stretch: Place hand on back of head look toward armpit and gently pull until stretch is felt.    3) Lower Cervical Upper Thoracic Stretch: Clasp hands together in front with arms extended. Gently pull shoulder blades apart and bend head forward.     4) Scalene Stretch: Sit, one hand tucked under hip on side to be stretched, other hand over top of head. Gently pull head to side and backwards.

## 2024-01-17 NOTE — Therapy (Signed)
 OUTPATIENT OCCUPATIONAL THERAPY ORTHO TREATMENT  Patient Name: Nicole Freeman MRN: [id number] DOB:1995-10-10, 28 y.o., female Today's Date: 01/17/2024   END OF SESSION:  OT End of Session - 01/17/24 1238     Visit Number 2    Number of Visits 8    Date for Recertification  02/07/24    Authorization Type U[employer]    Authorization Time Period no auth required    OT Start Time [id number]    OT Stop Time 1018    OT Time Calculation (min) 42 min    Activity Tolerance Patient tolerated treatment well    Behavior During Therapy WFL for tasks assessed/performed           Past Medical History:  Diagnosis Date   Chiari malformation type I (HCC)    Hashimoto's disease    Headache    Thyroid  activity decreased    Von Willebrand disease (HCC)    Past Surgical History:  Procedure Laterality Date   ROBOTIC ASSISTED TOTAL HYSTERECTOMY WITH BILATERAL SALPINGO OOPHERECTOMY Bilateral 08/13/2023   Procedure: HYSTERECTOMY, TOTAL, ROBOT-ASSISTED, LAPAROSCOPIC, WITH BILATERAL SALPINGECTOMY.;  Surgeon: Ozan, Jennifer, DO;  Location: [facility];  Service: Gynecology;  Laterality: Bilateral;   WISDOM TOOTH EXTRACTION     Patient Active Problem List   Diagnosis Date Noted   Constipation 11/20/2023   Abdominal pain, chronic, epigastric 11/20/2023   GERD (gastroesophageal reflux disease) 11/20/2023   Esophageal dysphagia 11/20/2023   Endometriosis determined by laparoscopy 08/13/2023   Dysmenorrhea 08/13/2023   Abnormal uterine bleeding 08/13/2023   Nexplanon  insertion 06/25/2022   Encounter for IUD removal 06/25/2022   Migraines 02/23/2021   Chiari malformation type I (HCC)    Allergic contact dermatitis 05/12/2018   Von Willebrand disease 05/28/2017    PCP: Leonce Lukes, PA-C REFERRING PROVIDER: Onesimo Anes, MD   ONSET DATE: ~2 months  REFERRING DIAG:  M79.2 (ICD-10-CM) - Radicular pain in right arm  M54.2 (ICD-10-CM) - Cervicalgia   THERAPY DIAG:  Right shoulder pain,  unspecified chronicity  Other symptoms and signs involving the musculoskeletal system  Cervicalgia  Rationale for Evaluation and Treatment: Rehabilitation  SUBJECTIVE:   SUBJECTIVE STATEMENT: S: It's been numb on and off since last night  PERTINENT HISTORY: Nicole Freeman has pain in the right side of the neck with radiating pains into the right arm.  XR are negative. PMH significant for Chiari Malformation Type 1, Hashimoto's, s/p hysterectomy.   PRECAUTIONS: None  WEIGHT BEARING RESTRICTIONS: No  PAIN:  Are you having pain? Yes: NPRS scale: 6/10 Pain location: trapezius down to deltoid Pain description: aching, sharp, numb Aggravating factors: carrying heavier things Relieving factors: rest  FALLS: Has patient fallen in last 6 months? No  PLOF: Independent  PATIENT GOALS: To reduce pain.  NEXT MD VISIT: N/A  OBJECTIVE:   HAND DOMINANCE: Right  ADLs: Overall ADLs: Pt having severe pain and weakness noted when trying to hold and carry her daughter. Unable to tolerate carrying/lifting heavier items such as a case of water . Pt also reports difficulty sleeping due to pain and discomfort.   FUNCTIONAL OUTCOME MEASURES: Quick Dash: 34.09  UPPER EXTREMITY ROM:       Assessed in sitting, er/IR adducted  Active ROM Right eval  Shoulder flexion 149  Shoulder abduction 172  Shoulder internal rotation 90  Shoulder external rotation 64 *pain  (Blank rows = not tested)    Cervical ROM is full however pt has pulling sensation with L rotations and flexion.  UPPER EXTREMITY MMT:  Assessed in sitting, er/IR adducted  MMT Right eval  Shoulder flexion 4+/5  Shoulder abduction 4+/5  Shoulder internal rotation 5/5  Shoulder external rotation 5/5  (Blank rows = not tested)  SENSATION: Pt having numbness down the shoulder and scapula  EDEMA: No swelling noted   OBSERVATIONS: Stiffness noted with all neck movements   TODAY'S TREATMENT:                                                                                                                               DATE:  01/17/24 -Manual techniques: myofascial release to right upper arm, anterior shoulder, trapezius, and scapular regions to decrease pain and fascial restrictions  -Cervical neck stretches: trapezius, levator, scalene, forward flexion, 2x10 holds -Scapular theraband: green-row, extension, retraction, 10 reps -Countertop push-ups: shoulders adducted, shoulders abducted, 10 reps each -X to V arms: 10 reps  12/27/23 -Cervical Stretching: extension/flexion, lateral rotations, lateral tilts, x10 w/ ~5 sec holds  -Shoulder A/ROM: flexion, abduction, protraction, horizontal abduction, er/IR, x10   PATIENT EDUCATION: Education details: Biomedical Scientist Person educated: Patient Education method: Explanation, Demonstration, and Handouts Education comprehension: verbalized understanding and returned demonstration  HOME EXERCISE PROGRAM: 11/21: Cervical A/ROM 12/12: cervical neck stretches   GOALS: Goals reviewed with patient? Yes   SHORT TERM GOALS: Target date: 02/05/24  Pt will be provided with and educated on HEP to improve mobility in RUE required for use during ADL completion.   LONG TERM GOALS: Target date: 02/05/24  Pt will decrease pain in RUE to 3/10 or less to improve ability to sleep for 2+ consecutive hours without waking due to pain.   Goal status: IN PROGRESS  2.  Pt will decrease RUE fascial restrictions to min amounts or less to improve mobility required for functional reaching tasks.   Goal status: IN PROGRESS  3.  Pt will increase RUE A/ROM of flexion by 10 degrees to improve ability to use RUE when reaching overhead during dressing and bathing tasks.   Goal status: IN PROGRESS  4.  Pt will increase RUE strength to 5/5 or greater to improve ability to use RUE when lifting or carrying items during meal preparation/housework/yardwork tasks.    Goal status: IN PROGRESS  5.  Pt will return to highest level of function using RUE as dominant during functional task completion.   Goal status: IN PROGRESS   ASSESSMENT:  CLINICAL IMPRESSION: Pt reports she did not complete her HEP, had some confusion. Pt with significant fascial restrictions in the anterior shoulder and trapezius. Session focusing on cervical stretches and scapular stability/strengthening. Pt without increased pain during session, x to v arms did cause some discomfort. Verbal cuing for form and technique during tasks. Discussed possibility of dry needling with pt, pt is interested. OT will send PT order for dry needling to the MD.     PERFORMANCE DEFICITS: in functional skills including in functional skills including ADLs, IADLs, coordination, tone, ROM, strength, pain, fascial restrictions,  muscle spasms, and UE functional use.    PLAN:  OT FREQUENCY: 2x/week  OT DURATION: 4 weeks  PLANNED INTERVENTIONS: 97168 OT Re-evaluation, 97535 self care/ADL training, 02889 therapeutic exercise, 97530 therapeutic activity, 97112 neuromuscular re-education, 97140 manual therapy, 97035 ultrasound, 97010 moist heat, 97032 electrical stimulation (manual), functional mobility training, energy conservation, coping strategies training, patient/family education, and DME and/or AE instructions  CONSULTED AND AGREED WITH PLAN OF CARE: Patient  PLAN FOR NEXT SESSION: Manual Therapy, Cervical ROM/stretching, Shoulder A/ROM   Sonny Cory, OTR/L  [phone] 01/17/2024, 12:39 PM

## 2024-01-19 ENCOUNTER — Ambulatory Visit: Payer: Self-pay | Admitting: Gastroenterology

## 2024-01-22 ENCOUNTER — Ambulatory Visit (HOSPITAL_COMMUNITY): Admitting: Occupational Therapy

## 2024-02-04 ENCOUNTER — Ambulatory Visit (HOSPITAL_COMMUNITY): Admitting: Occupational Therapy

## 2024-02-04 ENCOUNTER — Encounter (HOSPITAL_COMMUNITY): Payer: Self-pay | Admitting: Occupational Therapy

## 2024-02-04 DIAGNOSIS — R29898 Other symptoms and signs involving the musculoskeletal system: Secondary | ICD-10-CM

## 2024-02-04 DIAGNOSIS — M25511 Pain in right shoulder: Secondary | ICD-10-CM | POA: Diagnosis not present

## 2024-02-04 DIAGNOSIS — M542 Cervicalgia: Secondary | ICD-10-CM

## 2024-02-04 NOTE — Therapy (Signed)
 " OUTPATIENT OCCUPATIONAL THERAPY ORTHO TREATMENT  Patient Name: CHARLISHA MARKET MRN: [id number] DOB:Apr 05, 1995, 28 y.o., female Today's Date: 02/04/2024   END OF SESSION:  OT End of Session - 02/04/24 1453     Visit Number 3    Number of Visits 8    Date for Recertification  02/07/24    Authorization Type U[employer]    Authorization Time Period no auth required    OT Start Time 1431    OT Stop Time 1512    OT Time Calculation (min) 41 min    Activity Tolerance Patient tolerated treatment well    Behavior During Therapy WFL for tasks assessed/performed            Past Medical History:  Diagnosis Date   Chiari malformation type I (HCC)    Hashimoto's disease    Headache    Thyroid  activity decreased    Von Willebrand disease (HCC)    Past Surgical History:  Procedure Laterality Date   ROBOTIC ASSISTED TOTAL HYSTERECTOMY WITH BILATERAL SALPINGO OOPHERECTOMY Bilateral 08/13/2023   Procedure: HYSTERECTOMY, TOTAL, ROBOT-ASSISTED, LAPAROSCOPIC, WITH BILATERAL SALPINGECTOMY.;  Surgeon: Ozan, Jennifer, DO;  Location: [facility];  Service: Gynecology;  Laterality: Bilateral;   WISDOM TOOTH EXTRACTION     Patient Active Problem List   Diagnosis Date Noted   Constipation 11/20/2023   Abdominal pain, chronic, epigastric 11/20/2023   GERD (gastroesophageal reflux disease) 11/20/2023   Esophageal dysphagia 11/20/2023   Endometriosis determined by laparoscopy 08/13/2023   Dysmenorrhea 08/13/2023   Abnormal uterine bleeding 08/13/2023   Nexplanon  insertion 06/25/2022   Encounter for IUD removal 06/25/2022   Migraines 02/23/2021   Chiari malformation type I (HCC)    Allergic contact dermatitis 05/12/2018   Von Willebrand disease 05/28/2017    PCP: Leonce Lukes, PA-C REFERRING PROVIDER: Onesimo Anes, MD   ONSET DATE: ~2 months  REFERRING DIAG:  M79.2 (ICD-10-CM) - Radicular pain in right arm  M54.2 (ICD-10-CM) - Cervicalgia   THERAPY DIAG:  Right shoulder pain,  unspecified chronicity  Other symptoms and signs involving the musculoskeletal system  Cervicalgia  Rationale for Evaluation and Treatment: Rehabilitation  SUBJECTIVE:   SUBJECTIVE STATEMENT: S: I had a couple of days over Christmas but it's better now  PERTINENT HISTORY: Haden Cavenaugh Johnson-Satterfield has pain in the right side of the neck with radiating pains into the right arm.  XR are negative. PMH significant for Chiari Malformation Type 1, Hashimoto's, s/p hysterectomy.   PRECAUTIONS: None  WEIGHT BEARING RESTRICTIONS: No  PAIN:  Are you having pain? No  FALLS: Has patient fallen in last 6 months? No  PLOF: Independent  PATIENT GOALS: To reduce pain.  NEXT MD VISIT: N/A  OBJECTIVE:   HAND DOMINANCE: Right  ADLs: Overall ADLs: Pt having severe pain and weakness noted when trying to hold and carry her daughter. Unable to tolerate carrying/lifting heavier items such as a case of water . Pt also reports difficulty sleeping due to pain and discomfort.   FUNCTIONAL OUTCOME MEASURES: Quick Dash: 34.09  UPPER EXTREMITY ROM:       Assessed in sitting, er/IR adducted  Active ROM Right eval  Shoulder flexion 149  Shoulder abduction 172  Shoulder internal rotation 90  Shoulder external rotation 64 *pain  (Blank rows = not tested)    Cervical ROM is full however pt has pulling sensation with L rotations and flexion.  UPPER EXTREMITY MMT:     Assessed in sitting, er/IR adducted  MMT Right eval  Shoulder flexion 4+/5  Shoulder abduction 4+/5  Shoulder internal rotation 5/5  Shoulder external rotation 5/5  (Blank rows = not tested)  SENSATION: Pt having numbness down the shoulder and scapula  EDEMA: No swelling noted   OBSERVATIONS: Stiffness noted with all neck movements   TODAY'S TREATMENT:                                                                                                                              DATE:  02/04/24 -Manual techniques:  myofascial release to right upper arm, anterior shoulder, trapezius, and scapular regions to decrease pain and fascial restrictions  -Scapular theraband: green-row, extension, retraction, 10 reps -A/ROM: standing-protraction, flexion, abduction, er, horizontal abduction, 10 reps -X to V arms: 10 reps -[employer]: 10 reps -Y lift off: 10 reps -Cervical neck stretches: trapezius, levator, scalene, forward flexion, 2x10 holds -Green theraband: shoulder flexion, protraction, abduction, horizontal abduction, 10 reps -Countertop push-ups: shoulders adducted, shoulders abducted, 10 reps each  01/17/24 -Manual techniques: myofascial release to right upper arm, anterior shoulder, trapezius, and scapular regions to decrease pain and fascial restrictions  -Cervical neck stretches: trapezius, levator, scalene, forward flexion, 2x10 holds -Scapular theraband: green-row, extension, retraction, 10 reps -Countertop push-ups: shoulders adducted, shoulders abducted, 10 reps each -X to V arms: 10 reps  12/27/23 -Cervical Stretching: extension/flexion, lateral rotations, lateral tilts, x10 w/ ~5 sec holds  -Shoulder A/ROM: flexion, abduction, protraction, horizontal abduction, er/IR, x10   PATIENT EDUCATION: Education details: scapular theraband-green Person educated: Patient Education method: Explanation, Demonstration, and Handouts Education comprehension: verbalized understanding and returned demonstration  HOME EXERCISE PROGRAM: 11/21: Cervical A/ROM 12/12: cervical neck stretches 12/30: scapular theraband-green   GOALS: Goals reviewed with patient? Yes   SHORT TERM GOALS: Target date: 02/05/24  Pt will be provided with and educated on HEP to improve mobility in RUE required for use during ADL completion.   LONG TERM GOALS: Target date: 02/05/24  Pt will decrease pain in RUE to 3/10 or less to improve ability to sleep for 2+ consecutive hours without waking due to pain.   Goal  status: IN PROGRESS  2.  Pt will decrease RUE fascial restrictions to min amounts or less to improve mobility required for functional reaching tasks.   Goal status: IN PROGRESS  3.  Pt will increase RUE A/ROM of flexion by 10 degrees to improve ability to use RUE when reaching overhead during dressing and bathing tasks.   Goal status: IN PROGRESS  4.  Pt will increase RUE strength to 5/5 or greater to improve ability to use RUE when lifting or carrying items during meal preparation/housework/yardwork tasks.   Goal status: IN PROGRESS  5.  Pt will return to highest level of function using RUE as dominant during functional task completion.   Goal status: IN PROGRESS   ASSESSMENT:  CLINICAL IMPRESSION: Pt reports her HEP is going well, has felt pretty good since the last session, did have a couple of rough days over the  holidays. Continued with manual techniques, noting improvement in trapezius and scapular fascial restrictions and muscle knots. Pt with intermittent numb/tingling in the RUE during session. Session tasks focusing on shoulder and scapular stability work, no increased pain reported during session. Verbal cuing for form and technique. Still waiting on MD signature on referral for dry needling.     PERFORMANCE DEFICITS: in functional skills including in functional skills including ADLs, IADLs, coordination, tone, ROM, strength, pain, fascial restrictions, muscle spasms, and UE functional use.    PLAN:  OT FREQUENCY: 2x/week  OT DURATION: 4 weeks  PLANNED INTERVENTIONS: 97168 OT Re-evaluation, 97535 self care/ADL training, 02889 therapeutic exercise, 97530 therapeutic activity, 97112 neuromuscular re-education, 97140 manual therapy, 97035 ultrasound, 97010 moist heat, 97032 electrical stimulation (manual), functional mobility training, energy conservation, coping strategies training, patient/family education, and DME and/or AE instructions  CONSULTED AND AGREED WITH PLAN  OF CARE: Patient  PLAN FOR NEXT SESSION: Manual Therapy, Cervical ROM/stretching, Shoulder A/ROM   Sonny Cory, OTR/L  [phone] 02/04/2024, 3:13 PM     "

## 2024-02-04 NOTE — Patient Instructions (Signed)

## 2024-02-07 ENCOUNTER — Ambulatory Visit (HOSPITAL_COMMUNITY): Admitting: Occupational Therapy

## 2024-02-11 ENCOUNTER — Encounter (HOSPITAL_COMMUNITY): Payer: Self-pay | Admitting: Occupational Therapy

## 2024-02-11 ENCOUNTER — Ambulatory Visit (HOSPITAL_COMMUNITY): Attending: Orthopedic Surgery | Admitting: Occupational Therapy

## 2024-02-11 ENCOUNTER — Encounter: Payer: Self-pay | Admitting: Orthopedic Surgery

## 2024-02-11 DIAGNOSIS — M542 Cervicalgia: Secondary | ICD-10-CM

## 2024-02-11 DIAGNOSIS — M25511 Pain in right shoulder: Secondary | ICD-10-CM | POA: Insufficient documentation

## 2024-02-11 DIAGNOSIS — R29898 Other symptoms and signs involving the musculoskeletal system: Secondary | ICD-10-CM | POA: Insufficient documentation

## 2024-02-11 NOTE — Therapy (Signed)
 " OUTPATIENT OCCUPATIONAL THERAPY ORTHO TREATMENT DISCHARGE NOTE  Patient Name: Nicole Freeman MRN: [id number] DOB:[birth year], 29 y.o., female Today's Date: 02/11/2024  OCCUPATIONAL THERAPY DISCHARGE SUMMARY  Visits from Start of Care: 4  Current functional level related to goals / functional outcomes: Pt has full ROM and 5/5 strength.   Remaining deficits: Pt continues to have moderate to severe pain in her neck and trapezius following any exercise or UE heavy task.    Education / Equipment: Pt has been provided a comprehensive HEP.    Plan: Patient agrees to discharge as she is following up with PT for dry needling and pain relief.       END OF SESSION:  OT End of Session - 02/11/24 1255     Visit Number 4    Number of Visits 8    Date for Recertification  02/07/24    Authorization Type UHC    Authorization Time Period no auth required    OT Start Time [phone]    OT Stop Time 1017    OT Time Calculation (min) 28 min    Activity Tolerance Patient tolerated treatment well    Behavior During Therapy WFL for tasks assessed/performed          Past Medical History:  Diagnosis Date   Chiari malformation type I (HCC)    Hashimoto's disease    Headache    Thyroid  activity decreased    Von Willebrand disease (HCC)    Past Surgical History:  Procedure Laterality Date   ROBOTIC ASSISTED TOTAL HYSTERECTOMY WITH BILATERAL SALPINGO OOPHERECTOMY Bilateral 08/13/2023   Procedure: HYSTERECTOMY, TOTAL, ROBOT-ASSISTED, LAPAROSCOPIC, WITH BILATERAL SALPINGECTOMY.;  Surgeon: Ozan, Jennifer, DO;  Location: [facility];  Service: Gynecology;  Laterality: Bilateral;   WISDOM TOOTH EXTRACTION     Patient Active Problem List   Diagnosis Date Noted   Constipation 11/20/2023   Abdominal pain, chronic, epigastric 11/20/2023   GERD (gastroesophageal reflux disease) 11/20/2023   Esophageal dysphagia 11/20/2023   Endometriosis determined by laparoscopy 08/13/2023   Dysmenorrhea  08/13/2023   Abnormal uterine bleeding 08/13/2023   Nexplanon  insertion 06/25/2022   Encounter for IUD removal 06/25/2022   Migraines 02/23/2021   Chiari malformation type I (HCC)    Allergic contact dermatitis 05/12/2018   Von Willebrand disease 05/28/2017    PCP: Nicole Lukes, PA-C REFERRING PROVIDER: Onesimo Anes, MD   ONSET DATE: ~2 months  REFERRING DIAG:  M79.2 (ICD-10-CM) - Radicular pain in right arm  M54.2 (ICD-10-CM) - Cervicalgia   THERAPY DIAG:  Right shoulder pain, unspecified chronicity  Other symptoms and signs involving the musculoskeletal system  Cervicalgia  Rationale for Evaluation and Treatment: Rehabilitation  SUBJECTIVE:   SUBJECTIVE STATEMENT: S: I just hurts so badly after exercising it and using it too much.  PERTINENT HISTORY: Nicole Freeman has pain in the right side of the neck with radiating pains into the right arm.  XR are negative. PMH significant for Chiari Malformation Type 1, Hashimoto's, s/p hysterectomy.   PRECAUTIONS: None  WEIGHT BEARING RESTRICTIONS: No  PAIN:  Are you having pain? No  FALLS: Has patient fallen in last 6 months? No  PLOF: Independent  PATIENT GOALS: To reduce pain.  NEXT MD VISIT: N/A  OBJECTIVE:   HAND DOMINANCE: Right  ADLs: Overall ADLs: Pt having severe pain and weakness noted when trying to hold and carry her daughter. Unable to tolerate carrying/lifting heavier items such as a case of water . Pt also reports difficulty sleeping due to pain and discomfort.  FUNCTIONAL OUTCOME MEASURES: Quick Dash: 34.09 02/11/24: 27.27  UPPER EXTREMITY ROM:       Assessed in sitting, er/IR adducted  Active ROM Right eval Right 02/11/24  Shoulder flexion 149 157  Shoulder abduction 172 170  Shoulder internal rotation 90 90  Shoulder external rotation 64 *pain 65  (Blank rows = not tested)    Cervical ROM is full however pt has pulling sensation with L rotations and flexion.  UPPER  EXTREMITY MMT:     Assessed in sitting, er/IR adducted  MMT Right eval Right 02/11/24  Shoulder flexion 4+/5 5/5  Shoulder abduction 4+/5 5/5  Shoulder internal rotation 5/5 5/5  Shoulder external rotation 5/5 5/5  (Blank rows = not tested)  SENSATION: Pt having numbness down the shoulder and scapula  EDEMA: No swelling noted   OBSERVATIONS: Stiffness noted with all neck movements   TODAY'S TREATMENT:                                                                                                                              DATE:  02/11/24 -Manual techniques: myofascial release to right upper arm, anterior shoulder, trapezius, and scapular regions to decrease pain and fascial restrictions  -A/ROM: standing-protraction, flexion, abduction, er, horizontal abduction, 10 reps -X to V arms: 10 reps -[employer]: 10 reps -Y lift off: 10 reps  02/04/24 -Manual techniques: myofascial release to right upper arm, anterior shoulder, trapezius, and scapular regions to decrease pain and fascial restrictions  -Scapular theraband: green-row, extension, retraction, 10 reps -A/ROM: standing-protraction, flexion, abduction, er, horizontal abduction, 10 reps -X to V arms: 10 reps -[employer]: 10 reps -Y lift off: 10 reps -Cervical neck stretches: trapezius, levator, scalene, forward flexion, 2x10 holds -Green theraband: shoulder flexion, protraction, abduction, horizontal abduction, 10 reps -Countertop push-ups: shoulders adducted, shoulders abducted, 10 reps each  01/17/24 -Manual techniques: myofascial release to right upper arm, anterior shoulder, trapezius, and scapular regions to decrease pain and fascial restrictions  -Cervical neck stretches: trapezius, levator, scalene, forward flexion, 2x10 holds -Scapular theraband: green-row, extension, retraction, 10 reps -Countertop push-ups: shoulders adducted, shoulders abducted, 10 reps each -X to V arms: 10 reps   PATIENT  EDUCATION: Education details: Reviewed HEP Person educated: Patient Education method: Explanation, Demonstration, and Handouts Education comprehension: verbalized understanding and returned demonstration  HOME EXERCISE PROGRAM: 11/21: Cervical A/ROM 12/12: cervical neck stretches 12/30: scapular theraband-green   GOALS: Goals reviewed with patient? Yes   SHORT TERM GOALS: Target date: 02/05/24  Pt will be provided with and educated on HEP to improve mobility in RUE required for use during ADL completion.    Goal status: MET  LONG TERM GOALS: Target date: 02/05/24  Pt will decrease pain in RUE to 3/10 or less to improve ability to sleep for 2+ consecutive hours without waking due to pain.   Goal status: NOT MET  2.  Pt will decrease RUE fascial restrictions to min amounts or less to improve mobility  required for functional reaching tasks.   Goal status: NOT MET  3.  Pt will increase RUE A/ROM of flexion by 10 degrees to improve ability to use RUE when reaching overhead during dressing and bathing tasks.   Goal status: MET  4.  Pt will increase RUE strength to 5/5 or greater to improve ability to use RUE when lifting or carrying items during meal preparation/housework/yardwork tasks.   Goal status: MET  5.  Pt will return to highest level of function using RUE as dominant during functional task completion.   Goal status: MET   ASSESSMENT:  CLINICAL IMPRESSION: Pt completed reassessment this session where she demonstrated improved ROM and strength. Overall she is able to use her arm functionally and complete all tasks that she needs to, however then has debilitating pain afterwards, limiting sleep and further activity. OT recommending discharge from OT and will follow up with PT in the clinic for dry needling in a few weeks, as well as potential follow up with neurology for her chiari malformation. Pt will be discharged from outpatient OT.    PERFORMANCE DEFICITS: in  functional skills including in functional skills including ADLs, IADLs, coordination, tone, ROM, strength, pain, fascial restrictions, muscle spasms, and UE functional use.    PLAN:  OT FREQUENCY: 2x/week  OT DURATION: 4 weeks  PLANNED INTERVENTIONS: 97168 OT Re-evaluation, 97535 self care/ADL training, 02889 therapeutic exercise, 97530 therapeutic activity, 97112 neuromuscular re-education, 97140 manual therapy, 97035 ultrasound, 97010 moist heat, 97032 electrical stimulation (manual), functional mobility training, energy conservation, coping strategies training, patient/family education, and DME and/or AE instructions  CONSULTED AND AGREED WITH PLAN OF CARE: Patient  PLAN FOR NEXT SESSION: Discharge   Valentin Nightingale, OTR/L [phone] 02/11/2024, 12:56 PM     "

## 2024-02-13 ENCOUNTER — Ambulatory Visit (HOSPITAL_COMMUNITY): Admitting: Occupational Therapy

## 2024-02-16 ENCOUNTER — Ambulatory Visit (HOSPITAL_COMMUNITY): Admission: RE | Admit: 2024-02-16 | Discharge: 2024-02-16 | Attending: Orthopedic Surgery

## 2024-02-16 DIAGNOSIS — M542 Cervicalgia: Secondary | ICD-10-CM | POA: Diagnosis present

## 2024-02-18 ENCOUNTER — Ambulatory Visit (HOSPITAL_COMMUNITY): Admitting: Occupational Therapy

## 2024-02-28 ENCOUNTER — Other Ambulatory Visit: Payer: Self-pay

## 2024-02-28 ENCOUNTER — Ambulatory Visit (HOSPITAL_COMMUNITY)

## 2024-02-28 ENCOUNTER — Encounter: Payer: Self-pay | Admitting: *Deleted

## 2024-02-28 DIAGNOSIS — M25511 Pain in right shoulder: Secondary | ICD-10-CM | POA: Diagnosis not present

## 2024-02-28 DIAGNOSIS — R29898 Other symptoms and signs involving the musculoskeletal system: Secondary | ICD-10-CM

## 2024-02-28 DIAGNOSIS — M542 Cervicalgia: Secondary | ICD-10-CM

## 2024-02-28 NOTE — Patient Instructions (Signed)

## 2024-02-29 LAB — T4, FREE: Free T4: 1.2 ng/dL (ref 0.82–1.77)

## 2024-02-29 LAB — TSH: TSH: 1.59 u[IU]/mL (ref 0.450–4.500)

## 2024-03-02 ENCOUNTER — Ambulatory Visit: Admitting: Obstetrics & Gynecology

## 2024-03-02 ENCOUNTER — Encounter: Payer: Self-pay | Admitting: *Deleted

## 2024-03-04 ENCOUNTER — Ambulatory Visit: Admitting: Obstetrics & Gynecology

## 2024-03-04 ENCOUNTER — Encounter: Payer: Self-pay | Admitting: Obstetrics & Gynecology

## 2024-03-04 VITALS — BP 104/70 | Ht 62.0 in | Wt 185.0 lb

## 2024-03-04 DIAGNOSIS — Z8742 Personal history of other diseases of the female genital tract: Secondary | ICD-10-CM

## 2024-03-04 DIAGNOSIS — R6882 Decreased libido: Secondary | ICD-10-CM | POA: Diagnosis not present

## 2024-03-04 DIAGNOSIS — Z90710 Acquired absence of both cervix and uterus: Secondary | ICD-10-CM | POA: Diagnosis not present

## 2024-03-04 DIAGNOSIS — F520 Hypoactive sexual desire disorder: Secondary | ICD-10-CM

## 2024-03-04 NOTE — Progress Notes (Signed)
" ° °  GYN VISIT Patient name: Nicole Freeman MRN [id number]  Date of birth: [birth year] Chief Complaint:   Follow-up (Pelvic floor therapy)  History of Present Illness:   Nicole Freeman is a 29 y.o. [id number] PH female being seen today for the following concerns:  Started a few mos after surgery- she will get cycle symptoms- decreased libido and pain in ovaries.  Sometimes it will last for a few days, sometimes longer.  Also notes worsening anxiety.  The biggest concern is the lack of sex drive/desire.  She has not tried any OTC remedies.  In review, RAH, BS due to AUB/dysmenorrhea- path confirmed endometriosis.  In the past- tried Mirena , Nexplanon , Liletta  and Slynd  with no improvement of her symptoms.  Patient's last menstrual period was 07/25/2023.    Review of Systems:   Pertinent items are noted in HPI Denies fever/chills, dizziness, headaches, visual disturbances, fatigue, shortness of breath, chest pain, abdominal pain, vomiting. Pertinent History Reviewed:   Past Surgical History:  Procedure Laterality Date   ROBOTIC ASSISTED TOTAL HYSTERECTOMY WITH BILATERAL SALPINGO OOPHERECTOMY Bilateral 08/13/2023   Procedure: HYSTERECTOMY, TOTAL, ROBOT-ASSISTED, LAPAROSCOPIC, WITH BILATERAL SALPINGECTOMY.;  Surgeon: Aurora Rody, DO;  Location: [facility];  Service: Gynecology;  Laterality: Bilateral;   WISDOM TOOTH EXTRACTION      Past Medical History:  Diagnosis Date   Chiari malformation type I (HCC)    Hashimoto's disease    Headache    Thyroid  activity decreased    Von Willebrand disease (HCC)    Reviewed problem list, medications and allergies. Physical Assessment:   Vitals:   03/04/24 0923  BP: 104/70  Weight: 185 lb (83.9 kg)  Height: 5' 2 (1.575 m)  Body mass index is 33.84 kg/m.       Physical Examination:   General appearance: alert, well appearing, and in no distress  Psych: mood appropriate, normal affect  Skin: warm & dry    Cardiovascular: normal heart rate noted  Respiratory: normal respiratory effort, no distress  Abdomen: soft, non-tender. No rebound or guarding.  Incisions well healed  Extremities: no edema   Chaperone: N/A    Assessment & Plan:  1) Decreased libido, h/o endometriosis, s/p hysterectomy - Reassured patient that some of these changes are likely due to the hormonal changes of her cycle - Discussed conservative versus medical management  - Options reviewed starting with herbal supplements, Addyi or consideration for low-dose OCP -Risk benefit and discussion regarding each option -I will plan for herbal supplements and patient to contact insurance regarding Addyi coverage []  if no improvement by the summer, patient to return to clinic to discuss OCP  No orders of the defined types were placed in this encounter.   Return if symptoms worsen or fail to improve.   Baptiste Littler, DO Attending Obstetrician & Gynecologist, [facility] for [facility], [facility]    "

## 2024-03-05 ENCOUNTER — Ambulatory Visit: Admitting: Nurse Practitioner

## 2024-03-05 ENCOUNTER — Encounter: Payer: Self-pay | Admitting: Nurse Practitioner

## 2024-03-05 VITALS — BP 122/68 | HR 102 | Ht 62.0 in | Wt 187.0 lb

## 2024-03-05 DIAGNOSIS — E063 Autoimmune thyroiditis: Secondary | ICD-10-CM | POA: Diagnosis not present

## 2024-03-05 MED ORDER — LEVOTHYROXINE SODIUM 88 MCG PO TABS: 88.0000 ug | ORAL_TABLET | Freq: Every day | ORAL | 3 refills | Status: AC

## 2024-03-05 NOTE — Progress Notes (Signed)
 "         03/05/2024     Endocrinology Follow Up Note    Subjective:    Patient ID: Nicole Freeman, female    DOB: 1995-06-14, PCP Pcp, No.   Past Medical History:  Diagnosis Date   Chiari malformation type I (HCC)    Hashimoto's disease    Headache    Thyroid  activity decreased    Von Willebrand disease (HCC)     Past Surgical History:  Procedure Laterality Date   ROBOTIC ASSISTED TOTAL HYSTERECTOMY WITH BILATERAL SALPINGO OOPHERECTOMY Bilateral 08/13/2023   Procedure: HYSTERECTOMY, TOTAL, ROBOT-ASSISTED, LAPAROSCOPIC, WITH BILATERAL SALPINGECTOMY.;  Surgeon: Marilynn Nest, DO;  Location: [facility];  Service: Gynecology;  Laterality: Bilateral;   WISDOM TOOTH EXTRACTION      Social History   Socioeconomic History   Marital status: Married    Spouse name: Not on file   Number of children: 2   Years of education: Not on file   Highest education level: Not on file  Occupational History   Not on file  Tobacco Use   Smoking status: Former    Current packs/day: 0.50    Types: Cigarettes   Smokeless tobacco: Never   Tobacco comments:    quit April 2019  Vaping Use   Vaping status: Every Day  Substance and Sexual Activity   Alcohol use: Yes    Comment: once in a blue moon   Drug use: No   Sexual activity: Yes    Birth control/protection: Surgical    Comment: hyst  Other Topics Concern   Not on file  Social History Narrative   Right Handed   Lives in a one story    Drinks Caffeine    Lives with husband   Social Drivers of Health   Tobacco Use: Medium Risk (03/05/2024)   Patient History    Smoking Tobacco Use: Former    Smokeless Tobacco Use: Never    Passive Exposure: Not on file  Financial Resource Strain: Low Risk (06/01/2021)   Overall Financial Resource Strain (CARDIA)    Difficulty of Paying Living Expenses: Not hard at all  Food Insecurity: No Food Insecurity (06/01/2021)   Hunger Vital Sign    Worried About Running Out of Food in the Last  Year: Never true    Ran Out of Food in the Last Year: Never true  Transportation Needs: No Transportation Needs (06/01/2021)   PRAPARE - Administrator, Civil Service (Medical): No    Lack of Transportation (Non-Medical): No  Physical Activity: Insufficiently Active (06/01/2021)   Exercise Vital Sign    Days of Exercise per Week: 4 days    Minutes of Exercise per Session: 30 min  Stress: No Stress Concern Present (06/01/2021)   [name] of Occupational Health - Occupational Stress Questionnaire    Feeling of Stress : Not at all  Social Connections: Moderately Integrated (06/01/2021)   Social Connection and Isolation Panel    Frequency of Communication with Friends and Family: More than three times a week    Frequency of Social Gatherings with Friends and Family: Three times a week    Attends Religious Services: 1 to 4 times per year    Active Member of Clubs or Organizations: No    Attends Banker Meetings: Never    Marital Status: Married  Depression (PHQ2-9): Low Risk (06/25/2022)   Depression (PHQ2-9)    PHQ-2 Score: 0  Alcohol Screen: Low Risk (06/01/2021)   Alcohol  Screen    Last Alcohol Screening Score (AUDIT): 0  Housing: Low Risk (06/01/2021)   Housing    Last Housing Risk Score: 0  Utilities: Not on file  Health Literacy: Not on file    Family History  Problem Relation Age of Onset   Ulcers Father    Other Maternal Grandmother        malformed kidney   Other Son        borderline celiac, may end up getting it   Diabetes Maternal Aunt    Breast cancer Maternal Aunt    Breast cancer Paternal Aunt    Lung cancer Paternal Aunt    Colon cancer Neg Hx     Outpatient Encounter Medications as of 03/05/2024  Medication Sig   docusate sodium  (COLACE) 100 MG capsule Take 100 mg by mouth 2 (two) times daily. (Patient taking differently: Take 100 mg by mouth daily.)   pantoprazole  (PROTONIX ) 40 MG tablet Take 1 tablet (40 mg total) by mouth  daily before breakfast.   tiZANidine  (ZANAFLEX ) 4 MG tablet Take 1 tablet (4 mg total) by mouth every 8 (eight) hours as needed.   [DISCONTINUED] levothyroxine  (SYNTHROID ) 88 MCG tablet Take 1 tablet (88 mcg total) by mouth daily before breakfast.   levothyroxine  (SYNTHROID ) 88 MCG tablet Take 1 tablet (88 mcg total) by mouth daily before breakfast.   No facility-administered encounter medications on file as of 03/05/2024.    ALLERGIES: Allergies  Allergen Reactions   Banana Itching    Mouth and throat gets scratchy and itchy    VACCINATION STATUS: Immunization History  Administered Date(s) Administered   Tdap 06/01/2021     HPI  Nicole Freeman is 29 y.o. female who presents today with a medical history as above. she is being seen in consultation for hyperthyroidism requested by Pcp, No.  she has been dealing with symptoms of palpitations, weight loss (without changing diet), anxiety, hot flashes, alternating constipation and diarrhea, tremors, heart burn, insomnia, inability to be full after eating, and mild intermittent dysphagia and sore throat for 4. These symptoms are progressively worsening and troubling to her.  her most recent thyroid  labs revealed suppressed TSH of 0.007 on 04/05/22.  She notes these symptoms all started about 4 years ago following the birth of her second child.  The symptoms have come and gone but recently have been worse following the birth of her third child.  she denies choking, shortness of breath, no recent voice change.    she denies known family history of thyroid  dysfunction and denies family hx of thyroid  cancer. she denies personal history of goiter. she is not on any anti-thyroid  medications nor on any thyroid  hormone supplements. Denies use of Biotin containing supplements.  she is willing to proceed with appropriate work up and therapy for thyrotoxicosis.  She has history of Von Willebrand disease, Chiari Malformation, and migraines.   She also notes she had her Mirena  IUD removed, and while she was waiting to have Nexplanon  inserted, she says her headaches went away and she felt the best she has in a long time.  After the Nexplanon  was inserted, her symptoms started to resurface once again.  She did have partial hysterectomy since last visit and she notes her symptoms have improved somewhat but are still lingering.  She had hysterectomy in June, since then her symptoms have improved, however she still feels fatigued mainly when her cycle would be (still has ovaries).  Her OB recommended some herbal supplements to  help with these symptoms and she brought the list by to see if it would interfere with her thyroid .  I did not see anything on the list that is contraindicated.  Review of systems  Constitutional: + Minimally fluctuating body weight,  current Body mass index is 34.2 kg/m. , no fatigue, no subjective hyperthermia, no subjective hypothermia Eyes: no blurry vision, no xerophthalmia ENT: no sore throat, no nodules palpated in throat, no dysphagia/odynophagia, no hoarseness Cardiovascular: no chest pain, no shortness of breath, no palpitations, no leg swelling Respiratory: no cough, no shortness of breath Gastrointestinal: no nausea/vomiting/diarrhea Musculoskeletal: no muscle/joint aches Skin: no rashes, no hyperemia Neurological: no tremors, no numbness, no tingling, no dizziness Psychiatric: no depression, no anxiety   Objective:    BP 122/68 (BP Location: Right Arm, Patient Position: Sitting, Cuff Size: Large)   Pulse (!) 102   Ht 5' 2 (1.575 m)   Wt 187 lb (84.8 kg)   LMP 07/25/2023   BMI 34.20 kg/m   Wt Readings from Last 3 Encounters:  03/05/24 187 lb (84.8 kg)  03/04/24 185 lb (83.9 kg)  11/27/23 181 lb (82.1 kg)     BP Readings from Last 3 Encounters:  03/05/24 122/68  03/04/24 104/70  11/27/23 118/79     Physical Exam- Limited  Constitutional:  Body mass index is 34.2 kg/m. , not in  acute distress, normal state of mind Eyes:  EOMI, no exophthalmos Musculoskeletal: no gross deformities, strength intact in all four extremities, no gross restriction of joint movements Skin:  no rashes, no hyperemia Neurological: no tremor with outstretched hands   CMP     Component Value Date/Time   NA 138 11/20/2023 1151   NA 135 (L) 01/06/2014 1059   K 4.5 11/20/2023 1151   K 4.3 01/06/2014 1059   CL 102 11/20/2023 1151   CL 102 01/06/2014 1059   CO2 22 11/20/2023 1151   CO2 28 01/06/2014 1059   GLUCOSE 103 (H) 11/20/2023 1151   GLUCOSE 101 (H) 02/24/2023 1935   GLUCOSE 126 (H) 01/06/2014 1059   BUN 11 11/20/2023 1151   BUN 14 01/06/2014 1059   CREATININE 0.67 11/20/2023 1151   CREATININE 0.88 01/06/2014 1059   CALCIUM 9.3 11/20/2023 1151   CALCIUM 9.8 01/06/2014 1059   PROT 6.6 11/20/2023 1151   PROT 8.2 01/06/2014 1059   ALBUMIN 4.4 11/20/2023 1151   ALBUMIN 4.4 01/06/2014 1059   AST 15 11/20/2023 1151   AST 21 01/06/2014 1059   ALT 10 11/20/2023 1151   ALT 20 01/06/2014 1059   ALKPHOS 46 11/20/2023 1151   ALKPHOS 70 01/06/2014 1059   BILITOT 0.3 11/20/2023 1151   BILITOT 0.8 01/06/2014 1059   GFRNONAA >60 02/24/2023 1935   GFRNONAA >60 01/06/2014 1059   GFRNONAA >60 03/02/2013 1347   GFRAA >60 05/21/2017 1341   GFRAA >60 01/06/2014 1059   GFRAA >60 03/02/2013 1347     CBC    Component Value Date/Time   WBC 9.1 11/20/2023 1151   WBC 5.1 08/08/2023 0942   RBC 4.27 11/20/2023 1151   RBC 4.03 08/08/2023 0942   HGB 12.0 11/20/2023 1151   HCT 37.9 11/20/2023 1151   PLT 350 11/20/2023 1151   MCV 89 11/20/2023 1151   MCV 97 01/06/2014 1059   MCH 28.1 11/20/2023 1151   MCH 29.5 08/08/2023 0942   MCHC 31.7 11/20/2023 1151   MCHC 32.8 08/08/2023 0942   RDW 12.8 11/20/2023 1151   RDW 12.5 01/06/2014 1059  LYMPHSABS 1.1 11/20/2023 1151   LYMPHSABS 0.9 (L) 01/06/2014 1059   MONOABS 1.1 (H) 08/10/2016 2213   MONOABS 0.8 01/06/2014 1059   EOSABS 0.0  11/20/2023 1151   EOSABS 0.1 01/06/2014 1059   BASOSABS 0.0 11/20/2023 1151   BASOSABS 0.0 01/06/2014 1059     Diabetic Labs (most recent): No results found for: HGBA1C, MICROALBUR  Lipid Panel  No results found for: CHOL, TRIG, HDL, CHOLHDL, VLDL, LDLCALC, LDLDIRECT, LABVLDL   Lab Results  Component Value Date   TSH 1.590 02/28/2024   TSH 3.990 10/24/2023   TSH 1.110 06/18/2023   TSH 2.100 03/18/2023   TSH 2.500 12/05/2022   TSH 2.600 08/27/2022   TSH 6.380 (H) 07/13/2022   TSH 14.300 (H) 05/16/2022   TSH 3.270 02/13/2018   TSH 2.03 10/31/2012   FREET4 1.20 02/28/2024   FREET4 1.07 10/24/2023   FREET4 0.99 06/18/2023   FREET4 0.98 03/18/2023   FREET4 0.85 12/05/2022   FREET4 0.91 08/27/2022   FREET4 1.17 07/13/2022   FREET4 0.65 (L) 05/16/2022     Thyroid  US  from 05/31/22 CLINICAL DATA:  Hypothyroid.   EXAM: THYROID  ULTRASOUND   TECHNIQUE: Ultrasound examination of the thyroid  gland and adjacent soft tissues was performed.   COMPARISON:  None Available.   FINDINGS: Parenchymal Echotexture: Moderately heterogenous   Isthmus: 0.4 cm   Right lobe: 4.2 x 1.8 x 1.9 cm   Left lobe: 5.1 x 1.5 x 1.7 cm   _________________________________________________________   Estimated total number of nodules >/= 1 cm: 0   Number of spongiform nodules >/=  2 cm not described below (TR1): 0   Number of mixed cystic and solid nodules >/= 1.5 cm not described below (TR2): 0   _________________________________________________________   No discrete nodules are seen within the thyroid  gland. Normal vascularity on color Doppler imaging.   IMPRESSION: Mildly enlarged and heterogeneous thyroid  gland most consistent with thyroiditis.   No discrete thyroid  nodules are evident. No significant hypervascularity.     Electronically Signed   By: Wilkie Lent M.D.   On: 05/31/2022 16:20     Latest Reference Range & Units 12/05/22 15:23 03/18/23  09:36 06/18/23 13:59 10/24/23 08:42 02/28/24 16:17  TSH 0.450 - 4.500 uIU/mL 2.500 2.100 1.110 3.990 1.590  Triiodothyronine,Free,Serum 2.0 - 4.4 pg/mL 2.9      T4,Free(Direct) 0.82 - 1.77 ng/dL 9.14 9.01 9.00 8.92 8.79      Assessment & Plan:   1. Abnormal TSH 2. Positive thyroid  antibodies  she is being seen at a kind request of Pcp, No.  Her recent thyroid  labs show positive antibodies, indicating autoimmune thyroid  dysfunction.    Her previsit TFTs are consistent with appropriate hormone replacement.  She is advised to continue her Levothyroxine  88 mcg po daily before breakfast.  Will recheck TFTs prior to next visit and adjust dose accordingly.     -Patient is advised to maintain close follow up with Pcp, No for primary care needs.   I spent  32  minutes in the care of the patient today including review of labs from Thyroid  Function, CMP, and other relevant labs ; imaging/biopsy records (current and previous including abstractions from other facilities); face-to-face time discussing  her lab results and symptoms, medications doses, her options of short and long term treatment based on the latest standards of care / guidelines;   and documenting the encounter.  Nicole Freeman  participated in the discussions, expressed understanding, and voiced agreement with the above plans.  All questions were answered to her satisfaction. she is encouraged to contact clinic should she have any questions or concerns prior to her return visit.   Follow up plan: Return in about 4 months (around 07/03/2024) for Thyroid  follow up, Previsit labs.   Thank you for involving me in the care of this pleasant patient, and I will continue to update you with her progress.   Benton Rio, [facility] [address] [place], [place] [address] Phone: [phone] Fax: [phone]  03/05/2024, 9:06 AM "

## 2024-03-05 NOTE — Patient Instructions (Signed)

## 2024-03-06 ENCOUNTER — Ambulatory Visit (HOSPITAL_COMMUNITY)

## 2024-03-06 DIAGNOSIS — M25511 Pain in right shoulder: Secondary | ICD-10-CM

## 2024-03-06 DIAGNOSIS — M542 Cervicalgia: Secondary | ICD-10-CM

## 2024-03-06 DIAGNOSIS — R29898 Other symptoms and signs involving the musculoskeletal system: Secondary | ICD-10-CM

## 2024-03-06 NOTE — Therapy (Signed)
 " OUTPATIENT PHYSICAL THERAPY CERVICAL TREATMENT   Patient Name: Nicole Freeman MRN: [id number] DOB:03/06/1995, 29 y.o., female Today's Date: 03/06/2024  END OF SESSION:  PT End of Session - 03/06/24 1102     Visit Number 2    Number of Visits 6    Date for Recertification  04/10/24    Authorization Type [employer]    PT Start Time 1035    PT Stop Time 1115    PT Time Calculation (min) 40 min    Activity Tolerance Patient tolerated treatment well    Behavior During Therapy WFL for tasks assessed/performed          Past Medical History:  Diagnosis Date   Chiari malformation type I (HCC)    Hashimoto's disease    Headache    Thyroid  activity decreased    Von Willebrand disease (HCC)    Past Surgical History:  Procedure Laterality Date   ROBOTIC ASSISTED TOTAL HYSTERECTOMY WITH BILATERAL SALPINGO OOPHERECTOMY Bilateral 08/13/2023   Procedure: HYSTERECTOMY, TOTAL, ROBOT-ASSISTED, LAPAROSCOPIC, WITH BILATERAL SALPINGECTOMY.;  Surgeon: Ozan, Jennifer, DO;  Location: [facility];  Service: Gynecology;  Laterality: Bilateral;   WISDOM TOOTH EXTRACTION     Patient Active Problem List   Diagnosis Date Noted   Constipation 11/20/2023   Abdominal pain, chronic, epigastric 11/20/2023   GERD (gastroesophageal reflux disease) 11/20/2023   Esophageal dysphagia 11/20/2023   Endometriosis determined by laparoscopy 08/13/2023   Dysmenorrhea 08/13/2023   Abnormal uterine bleeding 08/13/2023   Nexplanon  insertion 06/25/2022   Encounter for IUD removal 06/25/2022   Migraines 02/23/2021   Chiari malformation type I (HCC)    Allergic contact dermatitis 05/12/2018   Von Willebrand disease 05/28/2017    PCP: [name], PA-A  REFERRING PROVIDER: Onesimo Oneil LABOR, MD  REFERRING DIAG: M54.2 (ICD-10-CM) - Cervicalgia M79.2 (ICD-10-CM) - Neuralgia and neuritis, unspecified  THERAPY DIAG:  Cervicalgia  Right shoulder pain, unspecified chronicity  Other symptoms and signs  involving the musculoskeletal system  Rationale for Evaluation and Treatment: Rehabilitation  ONSET DATE: about a year  SUBJECTIVE:                                                                                                                                                                                                         SUBJECTIVE STATEMENT: Felt amazing after last treatment; has started coming back a little; has not been able to do her stretches due to busy schedule; mild dead feeling in right arm right now; aching feeling.  Arrives with her son.  Eval:Arrives with daughter Nicole Freeman; has  had neck and upper trap pain right > left and migraine for about a year; N/T down right arm and shoots up into her head; some upper back pain; negative MRI and x-ray of the cervical spine; also sees a chiropractor; thinks possible thoracic outlet syndrome? Hand dominance: Right  PERTINENT HISTORY:  Nicole Freeman has pain in the right side of the neck with radiating pains into the right arm.  XR are negative. PMH significant for Chiari Malformation Type 1, Hashimoto's, s/p hysterectomy.     PAIN:  Are you having pain? Yes: NPRS scale: 6-7/10 Pain location: right side neck and shoulder; upper back and down right arm Pain description: shooting pain Aggravating factors: activity Relieving factors: rest, muscle relaxer, ibuprophen, Tylenol ; heat  PRECAUTIONS: None  RED FLAGS: None     WEIGHT BEARING RESTRICTIONS: No  FALLS:  Has patient fallen in last 6 months? 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  DIAGNOSTIC FINDINGS:  XR of the cervical spine were obtained in clinic today.  No comparison available.  No acute injury.  No anterolisthesis.  Well maintained disc height.  Normal alignment.  No bony lesions.     Impression: normal cervical spine XR  Narrative & Impression  MR CERVICAL SPINE WITHOUT CONTRAST   CLINICAL HISTORY: Neck pain, Chiari I malformation.   TECHNIQUE: Multiplanar, multisequence MRI of the cervical spine was performed without intravenous contrast.   COMPARISON: MRI brain July 17, 2019   FINDINGS: Normal cervical alignment. Disc spaces are normal in height and signal intensity. No fracture or subluxation. No paraspinal muscle edema.   Cervical cord is normal in course, caliber, and signal intensity. Downward descent of the cerebellar tonsils by 8 to 9 mm below the foramen magnum (sagittal T1 image 8).     C2-C3: No disc bulge. No facet hypertrophy. Patent central canal and neural foramina.   C3-C4: No disc bulge. No facet hypertrophy. Patent central canal and neural foramina.   C4-C5: No disc bulge. No facet hypertrophy. Patent central canal and neural foramina.   C5-C6: No disc bulge. No facet hypertrophy. Patent central canal and neural foramina.   C6-C7: No disc bulge. No facet hypertrophy. Patent central canal and neural foramina.   C7-T1: No disc bulge. No facet hypertrophy. Patent central canal and neural foramina.   IMPRESSION: 1.  Normal cervical alignment without fracture or subluxation.   2.  Patent central canal and neuroforamina throughout the cervical levels.   3. Stable downward descent of the cerebellar tonsils which may be seen with clinical history of Chiari I.   Electronically signed by: Margretta Blanch MD 02/16/2024 04:33 PM EST RP Workstation: [id number]    PATIENT SURVEYS:  NDI:  NECK DISABILITY INDEX  Date: 02/28/2024 Score                                Total 13/50   Minimum Detectable Change (90% confidence): 5 points or 10% points  COGNITION: Overall cognitive status: Within functional limits for tasks assessed  SENSATION: WFL  POSTURE: rounded shoulders, forward head, and increased thoracic  kyphosis  PALPATION: Tightness bilateral upper trap and levator right > left   CERVICAL ROM: *pulling  Active ROM AROM (deg) eval  Flexion full  Extension 45  Right lateral flexion 38  Left lateral  flexion 40  Right rotation 64*  Left rotation 70   (Blank rows = not tested)  UPPER EXTREMITY ROM:  Active ROM Right eval Left eval  Shoulder flexion Full* pull in right lat full  Shoulder extension    Shoulder abduction    Shoulder adduction    Shoulder extension    Shoulder internal rotation    Shoulder external rotation    Elbow flexion    Elbow extension    Wrist flexion    Wrist extension    Wrist ulnar deviation    Wrist radial deviation    Wrist pronation    Wrist supination     (Blank rows = not tested)  UPPER EXTREMITY MMT:  MMT Right eval Left eval  Shoulder flexion 5 5  Shoulder extension    Shoulder abduction    Shoulder adduction    Shoulder extension    Shoulder internal rotation    Shoulder external rotation    Middle trapezius    Lower trapezius    Elbow flexion 5 5  Elbow extension 5 4+  Wrist flexion    Wrist extension    Wrist ulnar deviation    Wrist radial deviation    Wrist pronation    Wrist supination    Grip strength     (Blank rows = not tested)  CERVICAL SPECIAL TESTS:  Test thoracic outlet    TREATMENT DATE:  03/06/2024 Review of HEP and goals Moist heat in sitting to cervical spine x 5'  Upper trap stretching 3 x 20 Levator stretching  3 x 20 Scapular retraction 5 hold x 10 Trigger Point Dry Needling  Subsequent Treatment: Instructions provided previously at initial dry needling treatment.   Patient Verbal Consent Given: Yes Education Handout Provided: Previously Provided Muscles Treated: bilateral upper traps Electrical Stimulation Performed: No Treatment Response/Outcome: big twitch right upper trap; overall loosening of muscle bilaterally  STM to bilateral upper traps, cervical spine and paraspinals x  8'     02/27/2024 physical therapy evaluation and HEP instruction                                                                                                                                   PATIENT EDUCATION:  Education details: Patient educated on exam findings, POC, scope of PT, HEP, and what to expect next visit. Person educated: Patient Education method: Explanation, Demonstration, and Handouts Education comprehension: verbalized understanding, returned demonstration, verbal cues required, and tactile cues required   HOME EXERCISE PROGRAM: Access Code: [email or web address] URL: [email or web address] Date: 02/28/2024 Prepared by: [facility]  Exercises - Seated Upper Trapezius Stretch  - 2 x daily - 7 x weekly - 1 sets - 5 reps - 20 sec hold - Seated Levator Scapulae Stretch  - 2 x daily - 7 x weekly - 1 sets - 5 reps - 20 sec hold - Seated Scapular Retraction  - 1 x daily - 7  x weekly - 10 reps - 5 sec hold  ASSESSMENT:  CLINICAL IMPRESSION: Today's session started with review of HEP and goals.  Patient verbalizes agreement with set rehab goals. She continues with tight bilateral upper traps right > left that contributes to neck pain and right upper extremity symptoms.   Continued with stretching of cervical spine musculature; postural strengthening, manual to identify trigger points, dry needling and STM to decrease muscle tension/tightness and pain.  Patient with notable decreased muscle spasm after treatment.  Patient will benefit from continued skilled therapy services to address deficits and promote return to optimal function.       Eval: Patient is a 29 y.o. female who was seen today for physical therapy evaluation and treatment for M54.2 (ICD-10-CM) - Cervicalgia M79.2 (ICD-10-CM) - Neuralgia and neuritis, unspecified. Patient demonstrates decreased strength, ROM restriction, reduced flexibility, increased tenderness to palpation and postural abnormalities which  are likely contributing to symptoms of pain and are negatively impacting patient ability to perform ADLs. Patient will benefit from skilled physical therapy services to address these deficits to reduce pain and improve level of function with ADLs   OBJECTIVE IMPAIRMENTS: decreased activity tolerance, decreased ROM, increased fascial restrictions, impaired perceived functional ability, increased muscle spasms, postural dysfunction, and pain.   ACTIVITY LIMITATIONS: carrying, lifting, and reach over head  PARTICIPATION LIMITATIONS: meal prep, cleaning, laundry, driving, shopping, and community activity   REHAB POTENTIAL: Good  CLINICAL DECISION MAKING: Evolving/moderate complexity  EVALUATION COMPLEXITY: Moderate   GOALS: Goals reviewed with patient? No  SHORT TERM GOALS: Target date: 03/20/2024  patient will be independent with initial HEP and compliant with HEP 3-4 times a week   Baseline:  Goal status: in progress  2.  Patient will report 50% improvement overall  Baseline:  Goal status: in progress  LONG TERM GOALS: Target date: 04/10/2024  Patient will be independent in self management strategies to improve quality of life and functional outcomes.  Baseline:  Goal status: in progress  2.  Patient will report 70% improvement overall  Baseline:  Goal status:in progress  3.  Patient will increase cervical mobility by 25 degrees throughout to improve ability to scan for safety with driving  Baseline:  Goal status: in progress   4.  Patient will improve NDI score by 5  points to demonstrate improved perceived function  Baseline: 13/50 Goal status: in progress    PLAN:  PT FREQUENCY: 1x/week  PT DURATION: 6 weeks  PLANNED INTERVENTIONS: 97164- PT Re-evaluation, 97110-Therapeutic exercises, 97530- Therapeutic activity, 97112- Neuromuscular re-education, 97535- Self Care, 02859- Manual therapy, [id number]- Gait training, [phone]- Orthotic Fit/training, [phone]- Canalith  repositioning, [id number]- Aquatic Therapy, [phone]- Splinting, [phone]- Wound care (first 20 sq cm), 97598- Wound care (each additional 20 sq cm)Patient/Family education, Balance training, Stair training, Taping, Dry Needling, Joint mobilization, Joint manipulation, Spinal manipulation, Spinal mobilization, Scar mobilization, and DME instructions.   PLAN FOR NEXT SESSION:  check for thoracic outlet syndrome? Dry needling as appropriate; manual as needed; postural strengthening and cervical mobility  11:31 AM, 03/06/24 Jivan Symanski [place] Welliver MPT [facility] [place] [phone] [phone]   Managed Medicaid Authorization Request Treatment Start Date: 03/19/2024  Visit Dx Codes: M54.2, M25.511, R29.898  Functional Tool Score: NDI 13/50; 26%  For all possible CPT codes, reference the Planned Interventions line above.     Check all conditions that are expected to impact treatment: {Conditions expected to impact treatment:None of these apply   If treatment provided at initial evaluation, no treatment charged due  to lack of authorization.     "

## 2024-03-12 ENCOUNTER — Ambulatory Visit (HOSPITAL_COMMUNITY): Attending: Orthopedic Surgery

## 2024-03-12 DIAGNOSIS — M25511 Pain in right shoulder: Secondary | ICD-10-CM

## 2024-03-12 DIAGNOSIS — R29898 Other symptoms and signs involving the musculoskeletal system: Secondary | ICD-10-CM

## 2024-03-12 DIAGNOSIS — M542 Cervicalgia: Secondary | ICD-10-CM

## 2024-03-12 NOTE — Therapy (Signed)
 " OUTPATIENT PHYSICAL THERAPY CERVICAL TREATMENT   Patient Name: Nicole Freeman MRN: [id number] DOB:1995/08/02, 29 y.o., female Today's Date: 03/12/2024  END OF SESSION:  PT End of Session - 03/12/24 1414     Visit Number 3    Number of Visits 6    Date for Recertification  04/10/24    Authorization Type [employer]    PT Start Time 1414    PT Stop Time 1455    PT Time Calculation (min) 41 min    Activity Tolerance Patient tolerated treatment well    Behavior During Therapy WFL for tasks assessed/performed          Past Medical History:  Diagnosis Date   Chiari malformation type I (HCC)    Hashimoto's disease    Headache    Thyroid  activity decreased    Von Willebrand disease (HCC)    Past Surgical History:  Procedure Laterality Date   ROBOTIC ASSISTED TOTAL HYSTERECTOMY WITH BILATERAL SALPINGO OOPHERECTOMY Bilateral 08/13/2023   Procedure: HYSTERECTOMY, TOTAL, ROBOT-ASSISTED, LAPAROSCOPIC, WITH BILATERAL SALPINGECTOMY.;  Surgeon: Ozan, Jennifer, DO;  Location: [facility];  Service: Gynecology;  Laterality: Bilateral;   WISDOM TOOTH EXTRACTION     Patient Active Problem List   Diagnosis Date Noted   Constipation 11/20/2023   Abdominal pain, chronic, epigastric 11/20/2023   GERD (gastroesophageal reflux disease) 11/20/2023   Esophageal dysphagia 11/20/2023   Endometriosis determined by laparoscopy 08/13/2023   Dysmenorrhea 08/13/2023   Abnormal uterine bleeding 08/13/2023   Nexplanon  insertion 06/25/2022   Encounter for IUD removal 06/25/2022   Migraines 02/23/2021   Chiari malformation type I (HCC)    Allergic contact dermatitis 05/12/2018   Von Willebrand disease 05/28/2017    PCP: Nicole Lucie PARAS, PA-A  REFERRING PROVIDER: Onesimo Freeman LABOR, MD  REFERRING DIAG: M54.2 (ICD-10-CM) - Cervicalgia M79.2 (ICD-10-CM) - Neuralgia and neuritis, unspecified  THERAPY DIAG:  Cervicalgia  Right shoulder pain, unspecified chronicity  Other symptoms and signs  involving the musculoskeletal system  Rationale for Evaluation and Treatment: Rehabilitation  ONSET DATE: about a year  SUBJECTIVE:                                                                                                                                                                                                         SUBJECTIVE STATEMENT: Arrives with son and daughter today; overall much better; has not really felt anything until this morning some tightness in right periscapular area.    Eval:Arrives with daughter Nicole Freeman; has had neck and upper trap pain right > left and migraine for about  a year; N/T down right arm and shoots up into her head; some upper back pain; negative MRI and x-ray of the cervical spine; also sees a chiropractor; thinks possible thoracic outlet syndrome? Hand dominance: Right  PERTINENT HISTORY:  Nicole Freeman has pain in the right side of the neck with radiating pains into the right arm.  XR are negative. PMH significant for Chiari Malformation Type 1, Hashimoto's, s/p hysterectomy.     PAIN:  Are you having pain? Yes: NPRS scale: 6-7/10 Pain location: right side neck and shoulder; upper back and down right arm Pain description: shooting pain Aggravating factors: activity Relieving factors: rest, muscle relaxer, ibuprophen, Tylenol ; heat  PRECAUTIONS: None  RED FLAGS: None     WEIGHT BEARING RESTRICTIONS: No  FALLS:  Has patient fallen in last 6 months? 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  DIAGNOSTIC FINDINGS:  XR of the cervical spine were obtained in clinic today.  No comparison available.  No acute injury.  No anterolisthesis.  Well maintained disc height.  Normal alignment.  No bony lesions.    Impression: normal cervical spine XR  Narrative & Impression  MR  CERVICAL SPINE WITHOUT CONTRAST   CLINICAL HISTORY: Neck pain, Chiari I malformation.   TECHNIQUE: Multiplanar, multisequence MRI of the cervical spine was performed without intravenous contrast.   COMPARISON: MRI brain July 17, 2019   FINDINGS: Normal cervical alignment. Disc spaces are normal in height and signal intensity. No fracture or subluxation. No paraspinal muscle edema.   Cervical cord is normal in course, caliber, and signal intensity. Downward descent of the cerebellar tonsils by 8 to 9 mm below the foramen magnum (sagittal T1 image 8).     C2-C3: No disc bulge. No facet hypertrophy. Patent central canal and neural foramina.   C3-C4: No disc bulge. No facet hypertrophy. Patent central canal and neural foramina.   C4-C5: No disc bulge. No facet hypertrophy. Patent central canal and neural foramina.   C5-C6: No disc bulge. No facet hypertrophy. Patent central canal and neural foramina.   C6-C7: No disc bulge. No facet hypertrophy. Patent central canal and neural foramina.   C7-T1: No disc bulge. No facet hypertrophy. Patent central canal and neural foramina.   IMPRESSION: 1.  Normal cervical alignment without fracture or subluxation.   2.  Patent central canal and neuroforamina throughout the cervical levels.   3. Stable downward descent of the cerebellar tonsils which may be seen with clinical history of Chiari I.   Electronically signed by: Nicole Blanch MD 02/16/2024 04:33 PM EST RP Workstation: [id number]    PATIENT SURVEYS:  NDI:  NECK DISABILITY INDEX  Date: 02/28/2024 Score                                Total 13/50   Minimum Detectable Change (90% confidence): 5 points or 10% points  COGNITION: Overall cognitive status: Within functional limits for tasks assessed  SENSATION: WFL  POSTURE: rounded shoulders, forward head, and increased thoracic kyphosis  PALPATION: Tightness bilateral upper trap and levator right >  left   CERVICAL ROM: *pulling  Active ROM AROM (deg) eval  Flexion full  Extension 45  Right lateral flexion 38  Left lateral flexion 40  Right rotation 64*  Left rotation 70   (Blank  rows = not tested)  UPPER EXTREMITY ROM:  Active ROM Right eval Left eval  Shoulder flexion Full* pull in right lat full  Shoulder extension    Shoulder abduction    Shoulder adduction    Shoulder extension    Shoulder internal rotation    Shoulder external rotation    Elbow flexion    Elbow extension    Wrist flexion    Wrist extension    Wrist ulnar deviation    Wrist radial deviation    Wrist pronation    Wrist supination     (Blank rows = not tested)  UPPER EXTREMITY MMT:  MMT Right eval Left eval  Shoulder flexion 5 5  Shoulder extension    Shoulder abduction    Shoulder adduction    Shoulder extension    Shoulder internal rotation    Shoulder external rotation    Middle trapezius    Lower trapezius    Elbow flexion 5 5  Elbow extension 5 4+  Wrist flexion    Wrist extension    Wrist ulnar deviation    Wrist radial deviation    Wrist pronation    Wrist supination    Grip strength     (Blank rows = not tested)  CERVICAL SPECIAL TESTS:  Test thoracic outlet    TREATMENT DATE:  03/12/2024 Moist heat in supine x 5' to decrease pain and improve soft tissue extensibility Supine: Cervical retraction 5 hold x 10  Scapular retraction 5 hold x 10 Manual cervical traction 10 x 10 hold Trigger Point Dry Needling  Subsequent Treatment: Instructions provided previously at initial dry needling treatment.   Patient Verbal Consent Given: Yes Education Handout Provided: Previously Provided Muscles Treated: bilateral upper trap; right rhomboid Electrical Stimulation Performed: No Treatment Response/Outcome: loosening of bilateral upper traps; twitch right rhomboid  Ended with STM to bilateral upper traps, cervical paraspinals and rhomboids x 8'   03/06/2024 Review  of HEP and goals Moist heat in sitting to cervical spine x 5'  Upper trap stretching 3 x 20 Levator stretching  3 x 20 Scapular retraction 5 hold x 10 Trigger Point Dry Needling  Subsequent Treatment: Instructions provided previously at initial dry needling treatment.   Patient Verbal Consent Given: Yes Education Handout Provided: Previously Provided Muscles Treated: bilateral upper traps Electrical Stimulation Performed: No Treatment Response/Outcome: big twitch right upper trap; overall loosening of muscle bilaterally  STM to bilateral upper traps, cervical spine and paraspinals x 8'     02/27/2024 physical therapy evaluation and HEP instruction                                                                                                                                   PATIENT EDUCATION:  Education details: Patient educated on exam findings, POC, scope of PT, HEP, and what to expect next visit. Person educated: Patient Education method: Explanation, Demonstration, and Handouts Education comprehension: verbalized understanding,  returned demonstration, verbal cues required, and tactile cues required   HOME EXERCISE PROGRAM: Access Code: [email or web address] URL: [email or web address] Date: 02/28/2024 Prepared by: [facility]  Exercises - Seated Upper Trapezius Stretch  - 2 x daily - 7 x weekly - 1 sets - 5 reps - 20 sec hold - Seated Levator Scapulae Stretch  - 2 x daily - 7 x weekly - 1 sets - 5 reps - 20 sec hold - Seated Scapular Retraction  - 1 x daily - 7 x weekly - 10 reps - 5 sec hold  ASSESSMENT:  CLINICAL IMPRESSION: Patient continues with upper trap tightness bilaterally and forward head and rounded shoulders.  Continued with interventions geared toward decreasing pain and improving cervical mobility; increasing postural strength.  Today patient is tighter left upper trap.  Also has some tight areas around the medial border of right scapula; all  improved/ less tight after treatment.   Patient will benefit from continued skilled therapy services to address deficits and promote return to optimal function.       Eval: Patient is a 29 y.o. female who was seen today for physical therapy evaluation and treatment for M54.2 (ICD-10-CM) - Cervicalgia M79.2 (ICD-10-CM) - Neuralgia and neuritis, unspecified. Patient demonstrates decreased strength, ROM restriction, reduced flexibility, increased tenderness to palpation and postural abnormalities which are likely contributing to symptoms of pain and are negatively impacting patient ability to perform ADLs. Patient will benefit from skilled physical therapy services to address these deficits to reduce pain and improve level of function with ADLs   OBJECTIVE IMPAIRMENTS: decreased activity tolerance, decreased ROM, increased fascial restrictions, impaired perceived functional ability, increased muscle spasms, postural dysfunction, and pain.   ACTIVITY LIMITATIONS: carrying, lifting, and reach over head  PARTICIPATION LIMITATIONS: meal prep, cleaning, laundry, driving, shopping, and community activity   REHAB POTENTIAL: Good  CLINICAL DECISION MAKING: Evolving/moderate complexity  EVALUATION COMPLEXITY: Moderate   GOALS: Goals reviewed with patient? No  SHORT TERM GOALS: Target date: 03/20/2024  patient will be independent with initial HEP and compliant with HEP 3-4 times a week   Baseline:  Goal status: in progress  2.  Patient will report 50% improvement overall  Baseline:  Goal status: in progress  LONG TERM GOALS: Target date: 04/10/2024  Patient will be independent in self management strategies to improve quality of life and functional outcomes.  Baseline:  Goal status: in progress  2.  Patient will report 70% improvement overall  Baseline:  Goal status:in progress  3.  Patient will increase cervical mobility by 25 degrees throughout to improve ability to scan for  safety with driving  Baseline:  Goal status: in progress   4.  Patient will improve NDI score by 5  points to demonstrate improved perceived function  Baseline: 13/50 Goal status: in progress    PLAN:  PT FREQUENCY: 1x/week  PT DURATION: 6 weeks  PLANNED INTERVENTIONS: 97164- PT Re-evaluation, 97110-Therapeutic exercises, 97530- Therapeutic activity, 97112- Neuromuscular re-education, 97535- Self Care, [id number]- Manual therapy, [id number]- Gait training, [phone]- Orthotic Fit/training, [phone]- Canalith repositioning, [id number]- Aquatic Therapy, [phone]- Splinting, [phone]- Wound care (first 20 sq cm), 97598- Wound care (each additional 20 sq cm)Patient/Family education, Balance training, Stair training, Taping, Dry Needling, Joint mobilization, Joint manipulation, Spinal manipulation, Spinal mobilization, Scar mobilization, and DME instructions.   PLAN FOR NEXT SESSION:  check for thoracic outlet syndrome? Dry needling as appropriate; manual as needed; postural strengthening and cervical mobility  2:16 PM, 03/12/24 Nyal Schachter Small Rajon Bisig MPT [facility] [place] [address]  [phone] Ey:[phone]   Managed Medicaid Authorization Request Treatment Start Date: 2024-02-29  Visit Dx Codes: M54.2, M25.511, R29.898  Functional Tool Score: NDI 13/50; 26%  For all possible CPT codes, reference the Planned Interventions line above.     Check all conditions that are expected to impact treatment: {Conditions expected to impact treatment:None of these apply   If treatment provided at initial evaluation, no treatment charged due to lack of authorization.     "

## 2024-03-18 ENCOUNTER — Ambulatory Visit (HOSPITAL_COMMUNITY)

## 2024-03-25 ENCOUNTER — Ambulatory Visit (HOSPITAL_COMMUNITY)

## 2024-04-01 ENCOUNTER — Ambulatory Visit (HOSPITAL_COMMUNITY)

## 2024-04-08 ENCOUNTER — Ambulatory Visit (HOSPITAL_COMMUNITY)

## 2024-04-15 ENCOUNTER — Ambulatory Visit (HOSPITAL_COMMUNITY)

## 2024-04-22 ENCOUNTER — Ambulatory Visit (HOSPITAL_COMMUNITY)

## 2024-07-03 ENCOUNTER — Ambulatory Visit: Admitting: Nurse Practitioner
